# Patient Record
Sex: Female | Born: 1980 | Race: Black or African American | Hispanic: No | Marital: Married | Smoking: Former smoker
Health system: Southern US, Community
[De-identification: ages and names within clinical notes are randomized; demographics above are authoritative.]

## PROBLEM LIST (undated history)

## (undated) DIAGNOSIS — E119 Type 2 diabetes mellitus without complications: Secondary | ICD-10-CM

## (undated) DIAGNOSIS — L03119 Cellulitis of unspecified part of limb: Secondary | ICD-10-CM

## (undated) DIAGNOSIS — E11628 Type 2 diabetes mellitus with other skin complications: Secondary | ICD-10-CM

## (undated) DIAGNOSIS — J45909 Unspecified asthma, uncomplicated: Secondary | ICD-10-CM

## (undated) HISTORY — DX: Type 2 diabetes mellitus with other skin complications: E11.628

## (undated) HISTORY — DX: Cellulitis of unspecified part of limb: L03.119

## (undated) HISTORY — PX: OTHER SURGICAL HISTORY: SHX169

---

## 2012-10-19 DIAGNOSIS — R109 Unspecified abdominal pain: Secondary | ICD-10-CM | POA: Insufficient documentation

## 2012-10-19 NOTE — ED Notes (Signed)
Pt c/o back that radiates across left flank and down left inguinal region, with nausea Denies vomiting. LMP November

## 2012-10-20 ENCOUNTER — Emergency Department (HOSPITAL_COMMUNITY)
Admission: EM | Admit: 2012-10-20 | Discharge: 2012-10-20 | Discharge status: Against Medical Advice | Payer: Self-pay | Attending: Emergency Medicine | Admitting: Emergency Medicine

## 2012-10-20 NOTE — ED Notes (Signed)
Called Pt twice to reassess vitals and no answer.

## 2012-11-17 ENCOUNTER — Emergency Department (HOSPITAL_COMMUNITY): Payer: Self-pay

## 2012-11-17 ENCOUNTER — Encounter (HOSPITAL_COMMUNITY): Payer: Self-pay | Admitting: *Deleted

## 2012-11-17 ENCOUNTER — Emergency Department (HOSPITAL_COMMUNITY)
Admission: EM | Admit: 2012-11-17 | Discharge: 2012-11-18 | Disposition: A | Discharge status: Home / Self Care | Payer: Self-pay | Attending: Emergency Medicine | Admitting: Emergency Medicine

## 2012-11-17 DIAGNOSIS — S91309A Unspecified open wound, unspecified foot, initial encounter: Secondary | ICD-10-CM | POA: Insufficient documentation

## 2012-11-17 DIAGNOSIS — Y9389 Activity, other specified: Secondary | ICD-10-CM | POA: Insufficient documentation

## 2012-11-17 DIAGNOSIS — Y929 Unspecified place or not applicable: Secondary | ICD-10-CM | POA: Insufficient documentation

## 2012-11-17 DIAGNOSIS — S91332A Puncture wound without foreign body, left foot, initial encounter: Secondary | ICD-10-CM

## 2012-11-17 DIAGNOSIS — W268XXA Contact with other sharp object(s), not elsewhere classified, initial encounter: Secondary | ICD-10-CM | POA: Insufficient documentation

## 2012-11-17 DIAGNOSIS — J45909 Unspecified asthma, uncomplicated: Secondary | ICD-10-CM | POA: Insufficient documentation

## 2012-11-17 DIAGNOSIS — Z23 Encounter for immunization: Secondary | ICD-10-CM | POA: Insufficient documentation

## 2012-11-17 HISTORY — DX: Unspecified asthma, uncomplicated: J45.909

## 2012-11-17 MED ORDER — TETANUS-DIPHTH-ACELL PERTUSSIS 5-2.5-18.5 LF-MCG/0.5 IM SUSP
0.5000 mL | Freq: Once | INTRAMUSCULAR | Status: AC
  Administered 2012-11-18: 0.5 mL via INTRAMUSCULAR
  Filled 2012-11-17: qty 0.5

## 2012-11-17 NOTE — ED Provider Notes (Signed)
History     CSN: [id number]  Arrival date & time 11/17/12  2022   First MD Initiated Contact with Patient 11/17/12 2352      Chief Complaint  Patient presents with  . Laceration    (Consider location/radiation/quality/duration/timing/severity/associated sxs/prior treatment) HPI Comments: PAtient stepped on a piece of broken glass, bled at the time Last tetanus 2005  Patient is a 32 y.o. female presenting with skin laceration. The history is provided by the patient.  Laceration Location:  Foot Foot laceration location:  L foot Depth:  Cutaneous Quality: straight   Bleeding: controlled   Time since incident:  4 hours Laceration mechanism:  Broken glass   Past Medical History  Diagnosis Date  . Asthma     History reviewed. No pertinent past surgical history.  No family history on file.  History  Substance Use Topics  . Smoking status: Never Smoker   . Smokeless tobacco: Not on file  . Alcohol Use: Yes    OB History   Grav Para Term Preterm Abortions TAB SAB Ect Mult Living                  Review of Systems  HENT: Negative.   Respiratory: Negative.   Gastrointestinal: Negative for nausea.  Musculoskeletal: Negative for joint swelling and gait problem.  Skin: Positive for wound.  Neurological: Negative for numbness.  All other systems reviewed and are negative.    Allergies  Amoxapine and related; Tramadol; and Rocephin  Home Medications   Current Outpatient Rx  Name  Route  Sig  Dispense  Refill  . ibuprofen (ADVIL,MOTRIN) 200 MG tablet   Oral   Take 400 mg by mouth every 6 (six) hours as needed for pain or headache.           BP 122/80  Pulse 93  Temp(Src) 98.6 F (37 C)  Resp 22  SpO2 98%  LMP 08/19/2012  Physical Exam  Nursing note and vitals reviewed. Constitutional: She is oriented to person, place, and time. She appears well-developed and well-nourished.  HENT:  Head: Normocephalic and atraumatic.  Eyes: Pupils are equal,  round, and reactive to light.  Neck: Normal range of motion.  Cardiovascular: Normal rate.   Pulmonary/Chest: Effort normal.  Musculoskeletal: Normal range of motion.  Small .5cm puncture/laceration to sole of L foot no active bleeding   Neurological: She is alert and oriented to person, place, and time.  Skin: Skin is warm. No erythema.    ED Course  Procedures (including critical care time)  Labs Reviewed - No data to display Dg Foot Complete Left  11/17/2012  *RADIOLOGY REPORT*  Clinical Data: Radiopaque foreign body.  Stepped on glass object.  LEFT FOOT - COMPLETE 3+ VIEW  Comparison: None.  Findings: There is abnormal alignment of the foot however this is probably chronic or congenital.  Metatarsus adductus is present with hallux valgus.  Pes planus is also present.  There is no radiopaque foreign body identified.  On one of the lateral views, there is a faint density over the plantar aspect of the foot which is likely artifactual or projectional, not seen on the second lateral view.  There is no fracture.  Mild distraction of the first and second metatarsal bases associated with metatarsus adductus.  IMPRESSION: No radiopaque foreign body.  Pes planus and metatarsus adductus.   Original Report Authenticated By: Andreas Newport, M.D.      No diagnosis found.    MDM   Xray reviewed wound  explored No FB will update tetanus         Arman Filter, NP 11/18/12 0005

## 2012-11-17 NOTE — ED Notes (Signed)
The pt stepped on a piece of broken mirror  Earlier today.  She reports that it went into the foot very deep and she thinks a piece of the mirror is still in the foot.  Intermittent bleeding since then

## 2012-11-18 NOTE — ED Provider Notes (Signed)
Medical screening examination/treatment/procedure(s) were performed by non-physician practitioner and as supervising physician I was immediately available for consultation/collaboration.  Jones Skene, M.D.   Jones Skene, MD 11/18/12 [phone]

## 2013-02-17 ENCOUNTER — Encounter (HOSPITAL_COMMUNITY): Payer: Self-pay | Admitting: *Deleted

## 2013-02-17 DIAGNOSIS — R609 Edema, unspecified: Secondary | ICD-10-CM | POA: Insufficient documentation

## 2013-02-17 DIAGNOSIS — J45909 Unspecified asthma, uncomplicated: Secondary | ICD-10-CM | POA: Insufficient documentation

## 2013-02-17 DIAGNOSIS — H53149 Visual discomfort, unspecified: Secondary | ICD-10-CM | POA: Insufficient documentation

## 2013-02-17 DIAGNOSIS — H538 Other visual disturbances: Secondary | ICD-10-CM | POA: Insufficient documentation

## 2013-02-17 DIAGNOSIS — R748 Abnormal levels of other serum enzymes: Secondary | ICD-10-CM | POA: Insufficient documentation

## 2013-02-17 DIAGNOSIS — Z888 Allergy status to other drugs, medicaments and biological substances status: Secondary | ICD-10-CM | POA: Insufficient documentation

## 2013-02-17 DIAGNOSIS — Z881 Allergy status to other antibiotic agents status: Secondary | ICD-10-CM | POA: Insufficient documentation

## 2013-02-17 DIAGNOSIS — R51 Headache: Secondary | ICD-10-CM | POA: Insufficient documentation

## 2013-02-17 LAB — BASIC METABOLIC PANEL
BUN: 10 mg/dL (ref 6–23)
Chloride: 105 mEq/L (ref 96–112)
GFR calc Af Amer: 82 mL/min — ABNORMAL LOW (ref >90)
Potassium: 3.5 mEq/L (ref 3.5–5.1)

## 2013-02-17 LAB — CBC
HCT: 39.8 % (ref 36.0–46.0)
Hemoglobin: 13.5 g/dL (ref 12.0–15.0)
WBC: 8.5 10*3/uL (ref 4.0–10.5)

## 2013-02-17 NOTE — ED Notes (Addendum)
Pt in c/o headache to the right side of head x2-3 days, pt states the pain is constant and has no relief at home, also c/o increased swelling in her ankles, states she always has some swelling but that is has been worse recently, pt also states she has been extremely thirsty recently and she can't seem to get enough to drink, no history of diabetes

## 2013-02-18 ENCOUNTER — Encounter (HOSPITAL_COMMUNITY): Payer: Self-pay | Admitting: Radiology

## 2013-02-18 ENCOUNTER — Emergency Department (HOSPITAL_COMMUNITY)
Admission: EM | Admit: 2013-02-18 | Discharge: 2013-02-18 | Disposition: A | Discharge status: Home / Self Care | Payer: Self-pay | Attending: Emergency Medicine | Admitting: Emergency Medicine

## 2013-02-18 ENCOUNTER — Emergency Department (HOSPITAL_COMMUNITY): Payer: Self-pay

## 2013-02-18 DIAGNOSIS — R7401 Elevation of levels of liver transaminase levels: Secondary | ICD-10-CM

## 2013-02-18 DIAGNOSIS — R519 Headache, unspecified: Secondary | ICD-10-CM

## 2013-02-18 LAB — HEPATIC FUNCTION PANEL
ALT: 50 U/L — ABNORMAL HIGH (ref 0–35)
AST: 48 U/L — ABNORMAL HIGH (ref 0–37)
Total Protein: 7.2 g/dL (ref 6.0–8.3)

## 2013-02-18 LAB — GLUCOSE, CAPILLARY: Glucose-Capillary: 114 mg/dL — ABNORMAL HIGH (ref 70–99)

## 2013-02-18 MED ORDER — METOCLOPRAMIDE HCL 10 MG PO TABS: 10.0000 mg | ORAL_TABLET | Freq: Four times a day (QID) | ORAL | Status: DC | PRN

## 2013-02-18 MED ORDER — METOCLOPRAMIDE HCL 5 MG/ML IJ SOLN
10.0000 mg | Freq: Once | INTRAMUSCULAR | Status: AC
  Administered 2013-02-18: 10 mg via INTRAVENOUS
  Filled 2013-02-18: qty 2

## 2013-02-18 MED ORDER — DIPHENHYDRAMINE HCL 50 MG/ML IJ SOLN
25.0000 mg | Freq: Once | INTRAMUSCULAR | Status: AC
  Administered 2013-02-18: 25 mg via INTRAVENOUS
  Filled 2013-02-18: qty 1

## 2013-02-18 NOTE — ED Provider Notes (Signed)
CSN: [id number]     Arrival date & time 02/17/13  2151 History     First MD Initiated Contact with Patient 02/18/13 0320     Chief Complaint  Patient presents with  . Headache   (Consider location/radiation/quality/duration/timing/severity/associated sxs/prior Treatment) Patient is a 32 y.o. female presenting with headaches. The history is provided by the patient.  Headache She has been having a dull right-sided headache for about the last week. Severity of headache waxes and wanes. It has been as severe as 10/10, but currently is at 8/10. It never goes away completely. Headache is worse with exposure to bright light. Nothing makes it better. She's tried taking acetaminophen with no relief. She has noted some blurring of her vision over the last 2 days. She denies nausea or vomiting. She denies weakness or numbness. She has not had a headache like this before. She is also noted that she has some swelling in her feet and ankles and that she is thirsty all the time.  Past Medical History  Diagnosis Date  . Asthma    History reviewed. No pertinent past surgical history. History reviewed. No pertinent family history. History  Substance Use Topics  . Smoking status: Never Smoker   . Smokeless tobacco: Not on file  . Alcohol Use: Yes   OB History   Grav Para Term Preterm Abortions TAB SAB Ect Mult Living                 Review of Systems  Neurological: Positive for headaches.  All other systems reviewed and are negative.    Allergies  Amoxapine and related; Tramadol; and Rocephin  Home Medications   Current Outpatient Rx  Name  Route  Sig  Dispense  Refill  . ibuprofen (ADVIL,MOTRIN) 200 MG tablet   Oral   Take 400 mg by mouth every 6 (six) hours as needed for pain or headache.          BP 104/60  Pulse 87  Temp(Src) 98.5 F (36.9 C) (Oral)  Resp 20  Wt 286 lb (129.729 kg)  SpO2 98% Physical Exam  Nursing note and vitals reviewed.   ED Course  Morbidly obese  32 year old female, resting comfortably and in no acute distress. Vital signs are normal. Oxygen saturation is 98%, which is normal. Head is normocephalic and atraumatic. PERRLA, EOMI. Oropharynx is clear. Fundi show no hemorrhage, exudate, or papilledema. Neck is nontender and supple without adenopathy or JVD. Back is nontender and there is no CVA tenderness. Lungs are clear without rales, wheezes, or rhonchi. Chest is nontender. Heart has regular rate and rhythm without murmur. Abdomen is soft, flat, nontender without masses or hepatosplenomegaly and peristalsis is normoactive. Extremities have 2+ edema, full range of motion is present. Skin is warm and dry without rash. Neurologic: Mental status is normal, cranial nerves are intact, there are no motor or sensory deficits.   Procedures (including critical care time)  Results for orders placed during the hospital encounter of 02/18/13  BASIC METABOLIC PANEL      Result Value Range   Sodium 138  135 - 145 mEq/L   Potassium 3.5  3.5 - 5.1 mEq/L   Chloride 105  96 - 112 mEq/L   CO2 26  19 - 32 mEq/L   Glucose, Bld 100 (*) 70 - 99 mg/dL   BUN 10  6 - 23 mg/dL   Creatinine, Ser 4.09  0.50 - 1.10 mg/dL   Calcium 9.1  8.4 - 81.1  mg/dL   GFR calc non Af Amer 71 (*) >90 mL/min   GFR calc Af Amer 82 (*) >90 mL/min  CBC      Result Value Range   WBC 8.5  4.0 - 10.5 K/uL   RBC 5.39 (*) 3.87 - 5.11 MIL/uL   Hemoglobin 13.5  12.0 - 15.0 g/dL   HCT 08.6  57.8 - 46.9 %   MCV 73.8 (*) 78.0 - 100.0 fL   MCH 25.0 (*) 26.0 - 34.0 pg   MCHC 33.9  30.0 - 36.0 g/dL   RDW 62.9  52.8 - 41.3 %   Platelets 324  150 - 400 K/uL  GLUCOSE, CAPILLARY      Result Value Range   Glucose-Capillary 92  70 - 99 mg/dL  GLUCOSE, CAPILLARY      Result Value Range   Glucose-Capillary 114 (*) 70 - 99 mg/dL  HEPATIC FUNCTION PANEL      Result Value Range   Total Protein 7.2  6.0 - 8.3 g/dL   Albumin 3.3 (*) 3.5 - 5.2 g/dL   AST 48 (*) 0 - 37 U/L   ALT 50 (*) 0  - 35 U/L   Alkaline Phosphatase 62  39 - 117 U/L   Total Bilirubin 0.3  0.3 - 1.2 mg/dL   Bilirubin, Direct <2.4  0.0 - 0.3 mg/dL   Indirect Bilirubin NOT CALCULATED  0.3 - 0.9 mg/dL   Ct Head Wo Contrast  02/18/2013   *RADIOLOGY REPORT*  Clinical Data: Headache for 5 days.  No recent injury.  CT HEAD WITHOUT CONTRAST  Technique:  Contiguous axial images were obtained from the base of the skull through the vertex without contrast.  Comparison: None.  Findings:  Calvarium:No acute osseous abnormality. No lytic or blastic lesion.  Orbits: No acute abnormality.  Brain: No evidence of acute abnormality, such as acute infarction, hemorrhage (high attenuation along the inner table of the bilateral temporal/parietal bones is considered artifactual), hydrocephalus, or mass lesion/mass effect.Empty appearance of the mildly expanded sella. Meckel's cave appears enlarged bilaterally, left more than right, with attempted cephalocele on the left.  IMPRESSION: 1.  Negative for acute intracranial abnormality. 2.  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 If there is history of chronic headaches, there is an association with pseudotumor cerebri.   Original Report Authenticated By: Tiburcio Pea    1. Headache   2. Elevated transaminase level     MDM  Headache of uncertain cause. Peripheral edema of uncertain cause. Blood sugar is only minimally elevated and renal function is normal. Hepatic function tests will be ordered as well as urinalysis to look for evidence of proteinuria. She will be sent for CT of the head. She will be given metoclopramide and diphenhydramine to try and treat her headache. She has not given a fluid bolus in light of her peripheral edema. Old records are reviewed and she has no relevant prior visits.  She got excellent relief of her headache with metoclopramide and diphenhydramine. Minimal elevation of ALT and AST is noted and patient is advised to have these repeated. CT  shows empty sella which does have an association with pseudotumor cerebri. However, response to metoclopramide to suggest that she does not have pseudotumor cerebri. She is referred back to her PCP for followup as she is given a prescription for metoclopramide to use as needed.  Dione Booze, MD 02/18/13 [phone]

## 2013-02-18 NOTE — ED Notes (Addendum)
Patient reports pain is still present with it continuing to be sharp in nature. Pain is an 8 of a 0/10 scale.

## 2014-07-24 DIAGNOSIS — I2699 Other pulmonary embolism without acute cor pulmonale: Secondary | ICD-10-CM

## 2014-07-24 HISTORY — DX: Other pulmonary embolism without acute cor pulmonale: I26.99

## 2016-12-21 ENCOUNTER — Encounter (HOSPITAL_COMMUNITY): Payer: Self-pay

## 2016-12-21 ENCOUNTER — Emergency Department (HOSPITAL_COMMUNITY)
Admission: EM | Admit: 2016-12-21 | Discharge: 2016-12-21 | Disposition: A | Discharge status: Home / Self Care | Payer: BLUE CROSS/BLUE SHIELD | Attending: Emergency Medicine | Admitting: Emergency Medicine

## 2016-12-21 DIAGNOSIS — L0501 Pilonidal cyst with abscess: Secondary | ICD-10-CM | POA: Insufficient documentation

## 2016-12-21 DIAGNOSIS — J45909 Unspecified asthma, uncomplicated: Secondary | ICD-10-CM | POA: Insufficient documentation

## 2016-12-21 DIAGNOSIS — L0291 Cutaneous abscess, unspecified: Secondary | ICD-10-CM | POA: Diagnosis present

## 2016-12-21 DIAGNOSIS — Z79899 Other long term (current) drug therapy: Secondary | ICD-10-CM | POA: Insufficient documentation

## 2016-12-21 DIAGNOSIS — L0591 Pilonidal cyst without abscess: Secondary | ICD-10-CM

## 2016-12-21 LAB — CBG MONITORING, ED: GLUCOSE-CAPILLARY: 342 mg/dL — AB (ref 65–99)

## 2016-12-21 MED ORDER — LIDOCAINE-EPINEPHRINE (PF) 2 %-1:200000 IJ SOLN
10.0000 mL | Freq: Once | INTRAMUSCULAR | Status: AC
  Administered 2016-12-21: 10 mL
  Filled 2016-12-21: qty 20

## 2016-12-21 MED ORDER — OXYCODONE-ACETAMINOPHEN 5-325 MG PO TABS
2.0000 | ORAL_TABLET | Freq: Once | ORAL | Status: AC
  Administered 2016-12-21: 2 via ORAL
  Filled 2016-12-21: qty 2

## 2016-12-21 MED ORDER — BUPIVACAINE HCL (PF) 0.5 % IJ SOLN
10.0000 mL | Freq: Once | INTRAMUSCULAR | Status: AC
  Administered 2016-12-21: 10 mL
  Filled 2016-12-21: qty 10

## 2016-12-21 MED ORDER — SULFAMETHOXAZOLE-TRIMETHOPRIM 800-160 MG PO TABS: 1.0000 | ORAL_TABLET | Freq: Two times a day (BID) | ORAL | 0 refills | Status: AC

## 2016-12-21 MED ORDER — HYDROCODONE-ACETAMINOPHEN 5-325 MG PO TABS: 1.0000 | ORAL_TABLET | Freq: Four times a day (QID) | ORAL | 0 refills | Status: DC | PRN

## 2016-12-21 MED ORDER — LIDOCAINE-EPINEPHRINE-TETRACAINE (LET) SOLUTION
3.0000 mL | Freq: Once | NASAL | Status: AC
  Administered 2016-12-21: 3 mL via TOPICAL
  Filled 2016-12-21: qty 3

## 2016-12-21 NOTE — ED Triage Notes (Signed)
Pt has abscess on the upper buttox that is hard and about 2in long

## 2016-12-21 NOTE — ED Provider Notes (Signed)
[facility]-EMERGENCY DEPT Provider Note   CSN: [id number] Arrival date & time: 12/21/16  4098  By signing my name below, I, Shannon Oneal, attest that this documentation has been prepared under the direction and in the presence of Foy Mungia, PA-C.  Electronically Signed: Rosario Oneal, ED Scribe. 12/21/16. 10:27 AM.  History   Chief Complaint Chief Complaint  Patient presents with  . Abscess   The history is provided by the patient. No language interpreter was used.    HPI Comments: Shannon Oneal is a 36 y.o. female with a PMHx of DM and asthma, who presents to the Emergency Department complaining of a moderate, gradually worsening area of pain and swelling to the left gluteal cleft onset four days ago. She notes some minimal drainage from the area. Pt states pain is exacerbated with palpation and direct pressure. No noted treatments for her symptoms were tried prior to coming into the ED. No h/o similar symptoms. Denies fever, chills, or any other associated symptoms.   Past Medical History:  Diagnosis Date  . Asthma     There are no active problems to display for this patient.  History reviewed. No pertinent surgical history.  OB History    No data available     Home Medications    Prior to Admission medications   Medication Sig Start Date End Date Taking? Authorizing Provider  doxycycline (VIBRAMYCIN) 100 MG capsule Take 1 capsule (100 mg total) by mouth 2 (two) times daily. 12/23/16   Roxy Horseman, PA-C  HYDROcodone-acetaminophen (NORCO/VICODIN) 5-325 MG tablet Take 1 tablet by mouth every 6 (six) hours as needed. 12/21/16   Taaliyah Delpriore C, PA-C  ibuprofen (ADVIL,MOTRIN) 200 MG tablet Take 400 mg by mouth every 6 (six) hours as needed for pain or headache.    [provider]  metoCLOPramide (REGLAN) 10 MG tablet Take 1 tablet (10 mg total) by mouth every 6 (six) hours as needed (Nausea or headache). 02/18/13   Dione Booze, MD  sulfamethoxazole-trimethoprim  (BACTRIM DS,SEPTRA DS) 800-160 MG tablet Take 1 tablet by mouth 2 (two) times daily. 12/21/16 12/28/16  Anselm Pancoast, PA-C   Family History No family history on file.  Social History Social History  Substance Use Topics  . Smoking status: Never Smoker  . Smokeless tobacco: Never Used  . Alcohol use Yes   Allergies   Amoxapine and related; Tramadol; and Rocephin [ceftriaxone]  Review of Systems Review of Systems  Constitutional: Negative for chills and fever.  Skin: Positive for wound.       +area of swelling and pain to the left gluteal cleft   Physical Exam Updated Vital Signs BP 113/88 (BP Location: Right Arm)   Pulse 74   Temp 98 F (36.7 C) (Oral)   Resp 18   Ht 5\' 5"  (1.651 m)   Wt 117.9 kg (260 lb)   LMP 11/24/2016   SpO2 98%   BMI 43.27 kg/m   Physical Exam  Constitutional: She appears well-developed and well-nourished. No distress.  HENT:  Head: Normocephalic and atraumatic.  Eyes: Conjunctivae are normal.  Neck: Neck supple.  Cardiovascular: Normal rate and regular rhythm.   Pulmonary/Chest: Effort normal.  Neurological: She is alert.  Skin: Skin is warm and dry. She is not diaphoretic.  7cm x 3.5cm area of tenderness, swelling, erythema, and induration to the left of the gluteal cleft.   Psychiatric: She has a normal mood and affect. Her behavior is normal.  Nursing note and vitals reviewed.  ED Treatments / Results  DIAGNOSTIC STUDIES: Oxygen Saturation is 97% on RA, normal by my interpretation.   COORDINATION OF CARE: 10:26 AM-Discussed next steps with pt. Pt verbalized understanding and is agreeable with the plan.   Labs (all labs ordered are listed, but only abnormal results are displayed) Labs Reviewed  CBG MONITORING, ED - Abnormal; Notable for the following:       Result Value   Glucose-Capillary 342 (*)    All other components within normal limits    EKG  EKG Interpretation None      Radiology No results  found.  Procedures .Marland Kitchen.Incision and Drainage Date/Time: 12/21/2016 12:26 PM Performed by: Anselm PancoastJOY, Xaniyah Buchholz C Authorized by: Anselm PancoastJOY, Aruna Nestler C   Consent:    Consent obtained:  Verbal   Consent given by:  Patient   Risks discussed:  Bleeding and damage to other organs   Alternatives discussed:  No treatment Universal protocol:    Procedure explained and questions answered to patient or proxy's satisfaction: yes     Relevant documents present and verified: yes     Test results available and properly labeled: yes     Imaging studies available: yes     Required blood products, implants, devices, and special equipment available: yes     Site/side marked: yes     Immediately prior to procedure a time out was called: yes     Patient identity confirmed:  Verbally with patient, arm band and provided demographic data Location:    Type:  Abscess   Size:  7x3.5cm   Location:  Anogenital   Anogenital location:  Gluteal cleft (L) Pre-procedure details:    Skin preparation:  Betadine Sedation:    Sedation type: n/a. Anesthesia (see MAR for exact dosages):    Anesthesia method:  Topical application and local infiltration   Topical anesthetic:  LET   Local anesthetic:  Lidocaine 2% WITH epi and bupivacaine 0.5% w/o epi Procedure type:    Complexity:  Complex Procedure details:    Needle aspiration: no     Incision types:  Single straight   Incision depth:  Subcutaneous   Scalpel blade:  11   Wound management:  Probed and deloculated, irrigated with saline and extensive cleaning   Drainage:  Bloody and purulent   Drainage amount:  Moderate   Wound treatment:  Wound left open   Packing materials:  1/4 in iodoform gauze   Amount 1/4" iodoform:  10in Post-procedure details:    Patient tolerance of procedure:  Tolerated well, no immediate complications     EMERGENCY DEPARTMENT US SOFT TISSUE INTERPRETATION "Study: Limited Soft Tissue Ultrasound"  INDICATIONS: Pain Multiple views of the body part  were obtained in real-time with a multi-frequency linear probe  PERFORMED BY: Myself IMAGES ARCHIVED?: Yes SIDE:Left BODY PART:Left gluteal region INTERPRETATION:  Abcess present    Medications Ordered in ED Medications  bupivacaine (MARCAINE) 0.5 % injection 10 mL (10 mLs Infiltration Given 12/21/16 1134)  lidocaine-EPINEPHrine (XYLOCAINE W/EPI) 2 %-1:200000 (PF) injection 10 mL (10 mLs Infiltration Given 12/21/16 1134)  lidocaine-EPINEPHrine-tetracaine (LET) solution (3 mLs Topical Given 12/21/16 1134)  oxyCODONE-acetaminophen (PERCOCET/ROXICET) 5-325 MG per tablet 2 tablet (2 tablets Oral Given 12/21/16 1142)   Initial Impression / Assessment and Plan / ED Course  I have reviewed the triage vital signs and the nursing notes.  Pertinent labs & imaging results that were available during my care of the patient were reviewed by me and considered in my medical decision making (see chart for  details).     Patient presents with an area of swelling and pain. Central fluid collection noted on ultrasound. I&D performed without immediate complication. Return in 3 days for wound check and packing removal, if still in place. The patient was given instructions for home care as well as return precautions. Patient voices understanding of these instructions, accepts the plan, and is comfortable with discharge.  Final Clinical Impressions(s) / ED Diagnoses   Final diagnoses:  Pilonidal cyst   New Prescriptions Discharge Medication List as of 12/21/2016  1:03 PM    START taking these medications   Details  HYDROcodone-acetaminophen (NORCO/VICODIN) 5-325 MG tablet Take 1 tablet by mouth every 6 (six) hours as needed., Starting Thu 12/21/2016, Print    sulfamethoxazole-trimethoprim (BACTRIM DS,SEPTRA DS) 800-160 MG tablet Take 1 tablet by mouth 2 (two) times daily., Starting Thu 12/21/2016, Until Thu 12/28/2016, Print       I personally performed the services described in this documentation, which was  scribed in my presence. The recorded information has been reviewed and is accurate.    Anselm Pancoast, PA-C 12/23/16 1137    Bethann Berkshire, MD 12/24/16 1205

## 2016-12-21 NOTE — Discharge Instructions (Signed)
Keep the area clean and dry. The area will continue to drain over the next couple days. Return to the ED in about 3 days for a wound check. Return as needed thereafter. Should symptoms recur, follow up with general surgery. Call to make an appointment.  You should also establish care with a primary care provider in the area. The case manager may be calling you to help you facilitate this. It is also very important that you properly manage your diabetes.  Please take all of your antibiotics until finished!   You may develop abdominal discomfort or diarrhea from the antibiotic.  You may help offset this with probiotics which you can buy or get in yogurt. Do not eat or take the probiotics until 2 hours after your antibiotic.   Ibuprofen, naproxen, or Tylenol for pain. Vicodin for severe pain. Do not drive or perform other dangerous activities while taking the Vicodin.

## 2016-12-21 NOTE — ED Notes (Signed)
CBG 342 mg/dL reported to Debroah Looputh Richards, RN

## 2016-12-23 ENCOUNTER — Encounter (HOSPITAL_COMMUNITY): Payer: Self-pay

## 2016-12-23 ENCOUNTER — Emergency Department (HOSPITAL_COMMUNITY)
Admission: EM | Admit: 2016-12-23 | Discharge: 2016-12-23 | Disposition: A | Discharge status: Home / Self Care | Payer: BLUE CROSS/BLUE SHIELD | Attending: Emergency Medicine | Admitting: Emergency Medicine

## 2016-12-23 DIAGNOSIS — Z4801 Encounter for change or removal of surgical wound dressing: Secondary | ICD-10-CM | POA: Diagnosis not present

## 2016-12-23 DIAGNOSIS — J45909 Unspecified asthma, uncomplicated: Secondary | ICD-10-CM | POA: Insufficient documentation

## 2016-12-23 DIAGNOSIS — Z79899 Other long term (current) drug therapy: Secondary | ICD-10-CM | POA: Insufficient documentation

## 2016-12-23 DIAGNOSIS — Z5189 Encounter for other specified aftercare: Secondary | ICD-10-CM

## 2016-12-23 DIAGNOSIS — Z760 Encounter for issue of repeat prescription: Secondary | ICD-10-CM | POA: Diagnosis not present

## 2016-12-23 MED ORDER — DOXYCYCLINE HYCLATE 100 MG PO CAPS: 100.0000 mg | ORAL_CAPSULE | Freq: Two times a day (BID) | ORAL | 0 refills | Status: DC

## 2016-12-23 MED ORDER — DOXYCYCLINE HYCLATE 100 MG PO TABS
100.0000 mg | ORAL_TABLET | Freq: Once | ORAL | Status: AC
  Administered 2016-12-23: 100 mg via ORAL
  Filled 2016-12-23: qty 1
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
## 2016-12-24 ENCOUNTER — Encounter (HOSPITAL_COMMUNITY): Payer: Self-pay | Admitting: Emergency Medicine

## 2016-12-24 ENCOUNTER — Emergency Department (HOSPITAL_COMMUNITY)
Admission: EM | Admit: 2016-12-24 | Discharge: 2016-12-24 | Disposition: A | Discharge status: Home / Self Care | Payer: BLUE CROSS/BLUE SHIELD | Attending: Emergency Medicine | Admitting: Emergency Medicine

## 2016-12-24 DIAGNOSIS — J45909 Unspecified asthma, uncomplicated: Secondary | ICD-10-CM | POA: Insufficient documentation

## 2016-12-24 DIAGNOSIS — Z5189 Encounter for other specified aftercare: Secondary | ICD-10-CM

## 2016-12-24 DIAGNOSIS — Z79899 Other long term (current) drug therapy: Secondary | ICD-10-CM | POA: Insufficient documentation

## 2016-12-24 DIAGNOSIS — L0501 Pilonidal cyst with abscess: Secondary | ICD-10-CM | POA: Insufficient documentation

## 2016-12-24 NOTE — Discharge Instructions (Signed)
Please read instructions below. Continue taking your antibiotic, doxycycline, as prescribed until it is gone. Schedule an appointment with the surgeon to follow-up on your abscess. Keep you wound clean, dry and covered. Return to the ER for fever, or new or concerning symptoms.

## 2016-12-24 NOTE — ED Triage Notes (Signed)
Pt to ER for wound check where patient had abscess to buttocks drained on Thursday. Pt reports hx of diabetes and wants to make sure it is healing appropriately. Reports minimal drainage. Reports compliance with antibiotics.

## 2016-12-24 NOTE — ED Notes (Signed)
Declined W/C at D/C and was escorted to lobby by RN. 

## 2016-12-24 NOTE — ED Provider Notes (Signed)
[facility]-EMER[facility]ENCY DEPT Provider Note   CSN: [id number] Arrival date & time: 12/24/16  1345  By signing my name below, I, Shannon Oneal, attest that this documentation has been prepared under the direction and in the presence of non-physician practitioner, Russo, [place], PA-C. Electronically Signed: Rosana Oneal, ED Scribe. 12/24/16. 3:44 PM.  History   Chief Complaint Chief Complaint  Patient presents with  . Wound Check   The history is provided by the patient. No language interpreter was used.   HPI Comments: Shannon Oneal is a 36 y.o. female who presents to the Emergency Department complaining of a wound check for an pilonidal cyst I&D on her gluteal cleft that occurred 4 days ago. Pt is compliant with her antibiotics. Pt notes drainage from the area. Pt reports associated nausea. Pt denies fever, vomiting or any other complaints at this time.  Past Medical History:  Diagnosis Date  . Asthma     There are no active problems to display for this patient.   History reviewed. No pertinent surgical history.  OB History    No data available       Home Medications    Prior to Admission medications   Medication Sig Start Date End Date Taking? Authorizing Provider  doxycycline (VIBRAMYCIN) 100 MG capsule Take 1 capsule (100 mg total) by mouth 2 (two) times daily. 12/23/16   Roxy Horseman, PA-C  HYDROcodone-acetaminophen (NORCO/VICODIN) 5-325 MG tablet Take 1 tablet by mouth every 6 (six) hours as needed. 12/21/16   Joy, Shawn C, PA-C  ibuprofen (ADVIL,MOTRIN) 200 MG tablet Take 400 mg by mouth every 6 (six) hours as needed for pain or headache.    [provider]  metoCLOPramide (REGLAN) 10 MG tablet Take 1 tablet (10 mg total) by mouth every 6 (six) hours as needed (Nausea or headache). 02/18/13   Dione Booze, MD  sulfamethoxazole-trimethoprim (BACTRIM DS,SEPTRA DS) 800-160 MG tablet Take 1 tablet by mouth 2 (two) times daily. 12/21/16 12/28/16  Anselm Pancoast, PA-C     Family History No family history on file.  Social History Social History  Substance Use Topics  . Smoking status: Never Smoker  . Smokeless tobacco: Never Used  . Alcohol use Yes     Allergies   Amoxapine and related; Tramadol; and Rocephin [ceftriaxone]   Review of Systems Review of Systems  Constitutional: Negative for fever.  Skin: Positive for wound.     Physical Exam Updated Vital Signs BP 117/76 (BP Location: Left Arm)   Pulse 86   Temp 98.3 F (36.8 C) (Oral)   Resp 17   Ht 5\' 5"  (1.651 m)   Wt 117.9 kg (260 lb)   LMP 11/24/2016   SpO2 99%   BMI 43.27 kg/m   Physical Exam  Constitutional: She appears well-developed and well-nourished.  HENT:  Head: Normocephalic and atraumatic.  Eyes: Conjunctivae are normal.  Cardiovascular: Normal rate.   Pulmonary/Chest: Effort normal.  Skin: No erythema.  Exam performed with chaperone present. There is 1.5 cm wound to the left gluteal cleft with purulent drainage. Packing was removed during exam and base of abscess is easily visualized. No significant surrounding erythema.   Psychiatric: She has a normal mood and affect. Her behavior is normal.  Nursing note and vitals reviewed.    ED Treatments / Results  DIAGNOSTIC STUDIES: Oxygen Saturation is 100% on RA, normal by my interpretation.   COORDINATION OF CARE: 3:43 PM-Discussed next steps with pt including repacking with wound and possible surgery. Pt verbalized understanding  and is agreeable with the plan.   Labs (all labs ordered are listed, but only abnormal results are displayed) Labs Reviewed - No data to display  EKG  EKG Interpretation None       Radiology No results found.  Procedures Procedures (including critical care time) EMERGENCY DEPARTMENT US SOFT TISSUE INTERPRETATION "Study: Limited Soft Tissue Ultrasound"  INDICATIONS: Soft tissue infection Multiple views of the body part were obtained in real-time with a  multi-frequency linear probe  PERFORMED BY: Myself IMAGES ARCHIVED?: Yes SIDE:Midline BODY PART:gluteal cleft INTERPRETATION:  No cellulitis noted and no remaining abscess noted. Wound is open with base of wound open to air corresponding to U/S findings, no other areas of fluid noted    Medications Ordered in ED Medications - No data to display   Initial Impression / Assessment and Plan / ED Course  I have reviewed the triage vital signs and the nursing notes.  Pertinent labs & imaging results that were available during my care of the patient were reviewed by me and considered in my medical decision making (see chart for details).     Pt with pilonidal abscess seen yesterday for wound recheck after I&D on 12/21/16. Pt w purulence noted during visit yesterday, wound repacked and pt started on Doxycycline. Today, wound still with purulence, U/S without residual fluid pocket, no surrounding cellulitis. Wound is open and draining. Pt afebrile and hemodynamically stable. Pt to continue Doxycycline as prescribed. Referral to general surgery given for definitive tx. Discussed home care and return precautions, pt advised to continue all antibiotics until they are gone.   Patient discussed with Dr. Juleen ChinaKohut, who agrees with care plan. Discussed results, findings, treatment and follow up. Patient advised of return precautions. Patient verbalized understanding and agreed with plan.     Final Clinical Impressions(s) / ED Diagnoses   Final diagnoses:  Wound check, abscess    New Prescriptions New Prescriptions   No medications on file   I personally performed the services described in this documentation, which was scribed in my presence. The recorded information has been reviewed and is accurate.    Russo, [place] N, PA-C 12/24/16 1702    Raeford RazorKohut, Stephen, MD 12/24/16 2012

## 2017-05-04 ENCOUNTER — Other Ambulatory Visit: Payer: Self-pay | Admitting: Obstetrics and Gynecology

## 2017-10-11 ENCOUNTER — Encounter (HOSPITAL_COMMUNITY): Payer: Self-pay | Admitting: Nurse Practitioner

## 2017-10-11 ENCOUNTER — Emergency Department (HOSPITAL_COMMUNITY): Payer: 59

## 2017-10-11 DIAGNOSIS — Z794 Long term (current) use of insulin: Secondary | ICD-10-CM | POA: Insufficient documentation

## 2017-10-11 DIAGNOSIS — Z79899 Other long term (current) drug therapy: Secondary | ICD-10-CM | POA: Insufficient documentation

## 2017-10-11 DIAGNOSIS — G8929 Other chronic pain: Secondary | ICD-10-CM | POA: Insufficient documentation

## 2017-10-11 DIAGNOSIS — M25512 Pain in left shoulder: Secondary | ICD-10-CM | POA: Insufficient documentation

## 2017-10-11 DIAGNOSIS — J45909 Unspecified asthma, uncomplicated: Secondary | ICD-10-CM | POA: Diagnosis not present

## 2017-10-11 NOTE — ED Triage Notes (Signed)
Pt is c/o of a HA and left shoulder pain that radiates to the left arm. So much so that she is reports being unable to move her left arm. Denies fall or any kind of trauma to the affected shoulder.

## 2017-10-12 ENCOUNTER — Emergency Department (HOSPITAL_COMMUNITY)
Admission: EM | Admit: 2017-10-12 | Discharge: 2017-10-12 | Disposition: A | Discharge status: Home / Self Care | Payer: 59 | Attending: Emergency Medicine | Admitting: Emergency Medicine

## 2017-10-12 DIAGNOSIS — M25512 Pain in left shoulder: Secondary | ICD-10-CM

## 2017-10-12 DIAGNOSIS — G8929 Other chronic pain: Secondary | ICD-10-CM

## 2017-10-12 MED ORDER — LIDOCAINE 5 % EX PTCH
1.0000 | MEDICATED_PATCH | CUTANEOUS | Status: DC
  Administered 2017-10-12: 1 via TRANSDERMAL
  Filled 2017-10-12: qty 1

## 2017-10-12 MED ORDER — KETOROLAC TROMETHAMINE 60 MG/2ML IM SOLN
60.0000 mg | Freq: Once | INTRAMUSCULAR | Status: AC
  Administered 2017-10-12: 60 mg via INTRAMUSCULAR
  Filled 2017-10-12: qty 2

## 2017-10-12 MED ORDER — MELOXICAM 7.5 MG PO TABS: 15.0000 mg | ORAL_TABLET | Freq: Every day | ORAL | 0 refills | Status: DC

## 2017-10-12 MED ORDER — DIAZEPAM 5 MG PO TABS
5.0000 mg | ORAL_TABLET | Freq: Once | ORAL | Status: AC
Start: 2017-10-12 — End: 2017-10-12
  Administered 2017-10-12: 5 mg via ORAL
  Filled 2017-10-12: qty 1

## 2017-10-12 MED ORDER — LIDOCAINE 5 % EX PTCH: 1.0000 | MEDICATED_PATCH | CUTANEOUS | 0 refills | Status: DC

## 2017-10-12 MED ORDER — DIAZEPAM 5 MG PO TABS: 5.0000 mg | ORAL_TABLET | Freq: Two times a day (BID) | ORAL | 0 refills | Status: DC | PRN

## 2017-10-12 MED ORDER — OXYCODONE-ACETAMINOPHEN 5-325 MG PO TABS: 1.0000 | ORAL_TABLET | Freq: Four times a day (QID) | ORAL | 0 refills | Status: DC | PRN

## 2017-10-12 MED ORDER — OXYCODONE-ACETAMINOPHEN 5-325 MG PO TABS
1.0000 | ORAL_TABLET | Freq: Once | ORAL | Status: AC
  Administered 2017-10-12: 1 via ORAL
  Filled 2017-10-12: qty 1

## 2017-10-12 NOTE — ED Provider Notes (Signed)
[facility]-EMERGENCY DEPT Provider Note   CSN: [id number] Arrival date & time: 10/11/17  2224    History   Chief Complaint Chief Complaint  Patient presents with  . Shoulder Pain    Left  . Headache    HPI Shannon Oneal is a 37 y.o. female.  Patient is a 37 year old female who presents to the emergency department for evaluation of left shoulder pain.  She reports similar pain over the past year.  Pain is been rather constant, but waxing and waning in severity.  She initially tried Tylenol when her symptoms began, but this provided little improvement.  She has since been managing with Aleve as needed.  She notes that this has not been helping her lately.  She has had intermittent tingling paresthesias down to her right hand.  The pain in her shoulder is worse with movement.  She states that it has caused her to develop a headache on the left side of her face.  She has not taken any additional medications for this.  No history of fall, trauma, injury.  No notable extremity weakness.  Patient denies any bowel or bladder incontinence.  The history is provided by the patient. No language interpreter was used.  Shoulder Pain    Headache      Past Medical History:  Diagnosis Date  . Asthma     There are no active problems to display for this patient.   History reviewed. No pertinent surgical history.  OB History   None      Home Medications    Prior to Admission medications   Medication Sig Start Date End Date Taking? Authorizing Provider  ibuprofen (ADVIL,MOTRIN) 200 MG tablet Take 600 mg by mouth every 6 (six) hours as needed for pain or headache.    Yes [provider]  insulin lispro protamine-lispro (HUMALOG 75/25 MIX) (75-25) 100 UNIT/ML SUSP injection Inject 30-60 Units into the skin 2 (two) times daily with a meal. 60 units in the am and 30 units in the pm   Yes [provider]  diazepam (VALIUM) 5 MG tablet Take 1 tablet (5 mg  total) by mouth every 12 (twelve) hours as needed for muscle spasms. 10/12/17   Antony Madura, PA-C  doxycycline (VIBRAMYCIN) 100 MG capsule Take 1 capsule (100 mg total) by mouth 2 (two) times daily. Patient not taking: Reported on 10/12/2017 12/23/16   Roxy Horseman, PA-C  HYDROcodone-acetaminophen (NORCO/VICODIN) 5-325 MG tablet Take 1 tablet by mouth every 6 (six) hours as needed. Patient not taking: Reported on 10/12/2017 12/21/16   Joy, Ines Bloomer C, PA-C  lidocaine (LIDODERM) 5 % Place 1 patch onto the skin daily. Remove & Discard patch within 12 hours or as directed by MD 10/12/17   Antony Madura, PA-C  meloxicam (MOBIC) 7.5 MG tablet Take 2 tablets (15 mg total) by mouth daily. 10/12/17   Antony Madura, PA-C  metoCLOPramide (REGLAN) 10 MG tablet Take 1 tablet (10 mg total) by mouth every 6 (six) hours as needed (Nausea or headache). Patient not taking: Reported on 10/12/2017 02/18/13   Dione Booze, MD  oxyCODONE-acetaminophen (PERCOCET/ROXICET) 5-325 MG tablet Take 1 tablet by mouth every 6 (six) hours as needed for severe pain. 10/12/17   Antony Madura, PA-C    Family History History reviewed. No pertinent family history.  Social History Social History   Tobacco Use  . Smoking status: Never Smoker  . Smokeless tobacco: Never Used  Substance Use Topics  . Alcohol use: Yes  .  Drug use: Not on file     Allergies   Amoxapine and related; Amoxicillin; Tramadol; and Rocephin [ceftriaxone]   Review of Systems Review of Systems  Neurological: Positive for headaches.   Ten systems reviewed and are negative for acute change, except as noted in the HPI.    Physical Exam Updated Vital Signs BP (!) 135/93 (BP Location: Right Arm)   Pulse 93   Temp 98.3 F (36.8 C) (Oral)   Resp 18   LMP 09/13/2017   SpO2 99%   Physical Exam  Constitutional: She is oriented to person, place, and time. She appears well-developed and well-nourished. No distress.  Nontoxic appearing and in NAD. Seemingly  uncomfortable.  HENT:  Head: Normocephalic and atraumatic.  Eyes: Conjunctivae and EOM are normal. No scleral icterus.  Neck: Normal range of motion.  Normal ROM.  Cardiovascular: Normal rate, regular rhythm and intact distal pulses.  Distal radial pulse 2+ in the LUE  Pulmonary/Chest: Effort normal. No respiratory distress.  Respirations even and unlabored  Musculoskeletal: Normal range of motion.  Limited ROM of the L shoulder 2/2 pain. There is TTP diffusely to the left shoulder without bony deformity or crepitus. TTP extends toward the midline along the course of the L trapezius.  Neurological: She is alert and oriented to person, place, and time.  Sensation to light touch intact in bilateral upper extremities.  5/5 grip strength noted on the right with 4+/5 grip strength on the left; suspect poor effort.  Skin: Skin is warm and dry. No rash noted. She is not diaphoretic. No erythema. No pallor.  Psychiatric: She has a normal mood and affect. Her behavior is normal.  Nursing note and vitals reviewed.    ED Treatments / Results  Labs (all labs ordered are listed, but only abnormal results are displayed) Labs Reviewed - No data to display  EKG  EKG Interpretation None       Radiology Dg Shoulder Left  Result Date: 10/11/2017 CLINICAL DATA:  Severe pain. Left shoulder pain for months. No known injury. EXAM: LEFT SHOULDER - 2+ VIEW COMPARISON:  None. FINDINGS: There is no evidence of fracture or dislocation. Minimal inferior glenoid spurring. No bony destructive change or focal lesion. Possible left cervical rib. Soft tissues are unremarkable. IMPRESSION: Minimal inferior glenoid spurring, likely degenerative. No acute osseous abnormality. Electronically Signed   By: Rubye OaksMelanie  Ehinger M.D.   On: 10/11/2017 23:14    Procedures Procedures (including critical care time)  Medications Ordered in ED Medications  lidocaine (LIDODERM) 5 % 1 patch (1 patch Transdermal Patch Applied  10/12/17 0333)  ketorolac (TORADOL) injection 60 mg (60 mg Intramuscular Given 10/12/17 0333)  diazepam (VALIUM) tablet 5 mg (5 mg Oral Given 10/12/17 0333)  oxyCODONE-acetaminophen (PERCOCET/ROXICET) 5-325 MG per tablet 1 tablet (1 tablet Oral Given 10/12/17 0333)     Initial Impression / Assessment and Plan / ED Course  I have reviewed the triage vital signs and the nursing notes.  Pertinent labs & imaging results that were available during my care of the patient were reviewed by me and considered in my medical decision making (see chart for details).     37 year old female presents to the emergency department for evaluation of acute on chronic left shoulder pain.  She is neurovascularly intact.  X-ray shows degenerative changes.  I suspect that this may be due to a persistent injury to the left rotator cuff.  Symptoms have been managed supportively in the emergency department.  I do not believe  further emergent workup is indicated at this time, especially in light of chronicity.  Will refer to orthopedics for follow-up.  Return precautions discussed and provided. Patient discharged in stable condition with no unaddressed concerns.   Final Clinical Impressions(s) / ED Diagnoses   Final diagnoses:  Chronic left shoulder pain    ED Discharge Orders        Ordered    meloxicam (MOBIC) 7.5 MG tablet  Daily     10/12/17 0324    diazepam (VALIUM) 5 MG tablet  Every 12 hours PRN     10/12/17 0324    oxyCODONE-acetaminophen (PERCOCET/ROXICET) 5-325 MG tablet  Every 6 hours PRN     10/12/17 0324    lidocaine (LIDODERM) 5 %  Every 24 hours     10/12/17 0325       Antony Madura, PA-C 10/12/17 0615    Molpus, Jonny Ruiz, MD 10/12/17 [phone]

## 2018-05-31 ENCOUNTER — Encounter (HOSPITAL_COMMUNITY): Payer: Self-pay

## 2018-05-31 ENCOUNTER — Emergency Department (HOSPITAL_COMMUNITY)
Admission: EM | Admit: 2018-05-31 | Discharge: 2018-06-01 | Disposition: A | Discharge status: Home / Self Care | Payer: No Typology Code available for payment source | Attending: Emergency Medicine | Admitting: Emergency Medicine

## 2018-05-31 ENCOUNTER — Emergency Department (HOSPITAL_COMMUNITY): Payer: No Typology Code available for payment source

## 2018-05-31 ENCOUNTER — Other Ambulatory Visit: Payer: Self-pay

## 2018-05-31 DIAGNOSIS — J45909 Unspecified asthma, uncomplicated: Secondary | ICD-10-CM | POA: Insufficient documentation

## 2018-05-31 DIAGNOSIS — Z794 Long term (current) use of insulin: Secondary | ICD-10-CM | POA: Insufficient documentation

## 2018-05-31 DIAGNOSIS — R079 Chest pain, unspecified: Secondary | ICD-10-CM | POA: Diagnosis present

## 2018-05-31 DIAGNOSIS — R0789 Other chest pain: Secondary | ICD-10-CM

## 2018-05-31 DIAGNOSIS — R739 Hyperglycemia, unspecified: Secondary | ICD-10-CM

## 2018-05-31 DIAGNOSIS — Z79899 Other long term (current) drug therapy: Secondary | ICD-10-CM | POA: Insufficient documentation

## 2018-05-31 LAB — I-STAT BETA HCG BLOOD, ED (NOT ORDERABLE)

## 2018-05-31 LAB — BASIC METABOLIC PANEL
ANION GAP: 9 (ref 5–15)
BUN: 8 mg/dL (ref 6–20)
CALCIUM: 8.7 mg/dL — AB (ref 8.9–10.3)
CO2: 24 mmol/L (ref 22–32)
Chloride: 102 mmol/L (ref 98–111)
Creatinine, Ser: 0.9 mg/dL (ref 0.44–1.00)
Glucose, Bld: 482 mg/dL — ABNORMAL HIGH (ref 70–99)
Potassium: 3.9 mmol/L (ref 3.5–5.1)
Sodium: 135 mmol/L (ref 135–145)

## 2018-05-31 LAB — POCT I-STAT TROPONIN I: TROPONIN I, POC: 0 ng/mL (ref 0.00–0.08)

## 2018-05-31 LAB — CBC
HCT: 46 % (ref 36.0–46.0)
Hemoglobin: 14.1 g/dL (ref 12.0–15.0)
MCH: 22.7 pg — ABNORMAL LOW (ref 26.0–34.0)
MCHC: 30.7 g/dL (ref 30.0–36.0)
MCV: 74.2 fL — ABNORMAL LOW (ref 80.0–100.0)
NRBC: 0 % (ref 0.0–0.2)
PLATELETS: 333 10*3/uL (ref 150–400)
RBC: 6.2 MIL/uL — AB (ref 3.87–5.11)
RDW: 13.7 % (ref 11.5–15.5)
WBC: 7.8 10*3/uL (ref 4.0–10.5)

## 2018-05-31 MED ORDER — DIPHENHYDRAMINE HCL 50 MG/ML IJ SOLN
25.0000 mg | Freq: Once | INTRAMUSCULAR | Status: AC
Start: 2018-05-31 — End: 2018-05-31
  Administered 2018-05-31: 25 mg via INTRAVENOUS
  Filled 2018-05-31: qty 1

## 2018-05-31 MED ORDER — INSULIN ASPART 100 UNIT/ML IV SOLN
10.0000 [IU] | Freq: Once | INTRAVENOUS | Status: AC
  Administered 2018-05-31: 10 [IU] via INTRAVENOUS
  Filled 2018-05-31: qty 0.1

## 2018-05-31 MED ORDER — MORPHINE SULFATE (PF) 4 MG/ML IV SOLN
4.0000 mg | Freq: Once | INTRAVENOUS | Status: AC
  Administered 2018-05-31: 4 mg via INTRAVENOUS
  Filled 2018-05-31: qty 1

## 2018-05-31 NOTE — ED Triage Notes (Signed)
Pt states that she has been having central chest pain x 1 week that has been worsening 7/10 jabbing pain. Pt states she has associated SOB. Pt states that she had a PE in the past. Pt states that he symptoms feel similar.

## 2018-05-31 NOTE — ED Provider Notes (Signed)
[facility]-EMERGENCY DEPT Provider Note   CSN: [id number] Arrival date & time: 05/31/18  2119     History   Chief Complaint Chief Complaint  Patient presents with  . Chest Pain    HPI Shannon Oneal is a 37 y.o. female.  The history is provided by the patient.  She has history of asthma, diabetes, pulmonary embolism and comes in with pleuritic left-sided chest pain for the last week.  She describes a pain just to the left of the sternum with some radiation to the back.  Pain is described as a sharp feeling like something is moving around inside.  It is worse with a deep breath.  Pain is rated at 7/10.  This does feel similar to how she felt with her pulmonary embolism last January.  She has been taking ibuprofen and acetaminophen at home without relief.  She denies fever, chills, sweats.  She denies nausea or vomiting.  She denies any diaphoresis.  She is a non-smoker and denies history of hypertension or hyperlipidemia and denies family history of premature coronary atherosclerosis.  She denies recent travel, recent surgery, exogenous estrogen use.  Her prior pulmonary embolism also occurred without any provocation and was treated with a course of dabigatran.  Of note, she does note tendency to have swelling in the left ankle.  Past Medical History:  Diagnosis Date  . Asthma     There are no active problems to display for this patient.   History reviewed. No pertinent surgical history.   OB History   None      Home Medications    Prior to Admission medications   Medication Sig Start Date End Date Taking? Authorizing Provider  diazepam (VALIUM) 5 MG tablet Take 1 tablet (5 mg total) by mouth every 12 (twelve) hours as needed for muscle spasms. 10/12/17   Antony Madura, PA-C  doxycycline (VIBRAMYCIN) 100 MG capsule Take 1 capsule (100 mg total) by mouth 2 (two) times daily. Patient not taking: Reported on 10/12/2017 12/23/16   Roxy Horseman, PA-C    HYDROcodone-acetaminophen (NORCO/VICODIN) 5-325 MG tablet Take 1 tablet by mouth every 6 (six) hours as needed. Patient not taking: Reported on 10/12/2017 12/21/16   Anselm Pancoast, PA-C  ibuprofen (ADVIL,MOTRIN) 200 MG tablet Take 600 mg by mouth every 6 (six) hours as needed for pain or headache.     [provider]  insulin lispro protamine-lispro (HUMALOG 75/25 MIX) (75-25) 100 UNIT/ML SUSP injection Inject 30-60 Units into the skin 2 (two) times daily with a meal. 60 units in the am and 30 units in the pm    [provider]  lidocaine (LIDODERM) 5 % Place 1 patch onto the skin daily. Remove & Discard patch within 12 hours or as directed by MD 10/12/17   Antony Madura, PA-C  meloxicam (MOBIC) 7.5 MG tablet Take 2 tablets (15 mg total) by mouth daily. 10/12/17   Antony Madura, PA-C  metoCLOPramide (REGLAN) 10 MG tablet Take 1 tablet (10 mg total) by mouth every 6 (six) hours as needed (Nausea or headache). Patient not taking: Reported on 10/12/2017 02/18/13   Dione Booze, MD  oxyCODONE-acetaminophen (PERCOCET/ROXICET) 5-325 MG tablet Take 1 tablet by mouth every 6 (six) hours as needed for severe pain. 10/12/17   Antony Madura, PA-C    Family History History reviewed. No pertinent family history.  Social History Social History   Tobacco Use  . Smoking status: Never Smoker  . Smokeless tobacco: Never Used  Substance  Use Topics  . Alcohol use: Yes  . Drug use: Not on file     Allergies   Amoxapine and related; Amoxicillin; Tramadol; and Rocephin [ceftriaxone]   Review of Systems Review of Systems  All other systems reviewed and are negative.    Physical Exam Updated Vital Signs BP (!) 142/87 (BP Location: Left Arm)   Pulse 85   Temp 98 F (36.7 C) (Oral)   Resp 18   Ht 5' 5.5" (1.664 m)   Wt 110.7 kg   LMP 04/10/2018   SpO2 99%   BMI 39.99 kg/m   Physical Exam  Nursing note and vitals reviewed.  37 year old female, resting comfortably and in no acute  distress. Vital signs are significant for borderline elevated blood pressure. Oxygen saturation is 99%, which is normal. Head is normocephalic and atraumatic. PERRLA, EOMI. Oropharynx is clear. Neck is nontender and supple without adenopathy or JVD. Back is nontender and there is no CVA tenderness. Lungs are clear without rales, wheezes, or rhonchi. Chest is moderately tender over the lower sternum and left parasternal area.  There is no crepitus. Heart has regular rate and rhythm without murmur. Abdomen is soft, flat, nontender without masses or hepatosplenomegaly and peristalsis is normoactive. Extremities have no cyanosis or edema, full range of motion is present.  Left calf circumference is 1 cm greater than right calf circumference.  There is no calf tenderness and no palpable cord, but there is a positive Homans sign. Skin is warm and dry without rash. Neurologic: Mental status is normal, cranial nerves are intact, there are no motor or sensory deficits.  ED Treatments / Results  Labs (all labs ordered are listed, but only abnormal results are displayed) Labs Reviewed  BASIC METABOLIC PANEL - Abnormal; Notable for the following components:      Result Value   Glucose, Bld 482 (*)    Calcium 8.7 (*)    All other components within normal limits  CBC - Abnormal; Notable for the following components:   RBC 6.20 (*)    MCV 74.2 (*)    MCH 22.7 (*)    All other components within normal limits  I-STAT TROPONIN, ED  I-STAT BETA HCG BLOOD, ED ([facility], [facility], [facility] ONLY)  POCT I-STAT TROPONIN I  I-STAT BETA HCG BLOOD, ED (NOT ORDERABLE)    EKG EKG Interpretation  Date/Time:  Friday May 31 2018 21:38:11 EST Ventricular Rate:  87 PR Interval:    QRS Duration: 86 QT Interval:  348 QTC Calculation: 419 R Axis:   -57 Text Interpretation:  Sinus rhythm Left anterior fascicular block Low voltage, precordial leads Abnormal R-wave progression, late transition No old tracing to compare  Confirmed by Dione Booze ([id number]) on 05/31/2018 11:14:17 PM   Radiology Dg Chest 2 View  Result Date: 05/31/2018 CLINICAL DATA:  Chest pain. EXAM: CHEST - 2 VIEW COMPARISON:  None. FINDINGS: The heart size and mediastinal contours are within normal limits. Both lungs are clear. No pneumothorax or pleural effusion is noted. The visualized skeletal structures are unremarkable. IMPRESSION: No active cardiopulmonary disease. Electronically Signed   By: Lupita Raider, M.D.   On: 05/31/2018 21:58   Ct Angio Chest Pe W And/or Wo Contrast  Result Date: 06/01/2018 CLINICAL DATA:  Acute onset of dyspnea. Angina. EXAM: CT ANGIOGRAPHY CHEST WITH CONTRAST TECHNIQUE: Multidetector CT imaging of the chest was performed using the standard protocol during bolus administration of intravenous contrast. Multiplanar CT image reconstructions and MIPs were obtained to evaluate  the vascular anatomy. CONTRAST:  ISOVUE-370 IOPAMIDOL (ISOVUE-370) INJECTION 76% COMPARISON:  Chest radiograph performed 05/31/2018 FINDINGS: Cardiovascular:  There is no evidence of pulmonary embolus. The heart is normal in size. The thoracic aorta is unremarkable. The great vessels are within normal limits. Mediastinum/Nodes: The mediastinum is unremarkable. No mediastinal lymphadenopathy is seen. No pericardial effusion is identified. The thyroid gland is unremarkable. No axillary lymphadenopathy is seen. Lungs/Pleura: There is a mildly mosaic pattern of parenchymal attenuation within the lungs, nonspecific in appearance. The lungs are otherwise grossly clear. No pleural effusion or pneumothorax is seen. No masses are identified. Upper Abdomen: The visualized portions of the liver and spleen are unremarkable. The visualized portions of the pancreas, adrenal glands and kidneys are within normal limits. Musculoskeletal: No acute osseous abnormalities are identified. The visualized musculature is unremarkable in appearance. Review of the MIP images  confirms the above findings. IMPRESSION: 1. No evidence of pulmonary embolus. 2. Mildly mosaic pattern of parenchymal attenuation within the lungs, nonspecific in appearance. Lungs otherwise grossly clear. Electronically Signed   By: Roanna Raider M.D.   On: 06/01/2018 01:20    Procedures Procedures   Medications Ordered in ED Medications  morphine 4 MG/ML injection 4 mg (has no administration in time range)  diphenhydrAMINE (BENADRYL) injection 25 mg (has no administration in time range)  insulin aspart (novoLOG) injection 10 Units (has no administration in time range)     Initial Impression / Assessment and Plan / ED Course  I have reviewed the triage vital signs and the nursing notes.  Pertinent labs & imaging results that were available during my care of the patient were reviewed by me and considered in my medical decision making (see chart for details).  Pleuritic chest pain and patient with history of pulmonary embolism.  Initial work-up is unremarkable.  ECG shows no ST or T changes, chest x-ray is normal, troponin is undetectable.  Only significant finding on lab testing is elevated glucose of 482.  She is given a dose of insulin.  With history of pulmonary embolism without any provocation and slight left calf swelling, she will be sent for CT angiogram.  Pretest probability is too high to warrant screening with d-dimer.  She is given morphine for pain.  CT scan shows no evidence of pulmonary embolism or pneumonia.  She has had slight relief of pain with morphine.  She is given a dose of ketorolac and discharged with prescription for naproxen.  Told to apply ice or heat, use acetaminophen as needed for additional pain relief.  Return precautions discussed.  Final Clinical Impressions(s) / ED Diagnoses   Final diagnoses:  Chest wall pain  Hyperglycemia    ED Discharge Orders         Ordered    naproxen (NAPROSYN) 500 MG tablet  2 times daily     06/01/18 0129             Dione Booze, MD 06/01/18 0131

## 2018-05-31 NOTE — ED Notes (Signed)
Provider at bedside

## 2018-06-01 ENCOUNTER — Encounter (HOSPITAL_COMMUNITY): Payer: Self-pay

## 2018-06-01 ENCOUNTER — Emergency Department (HOSPITAL_COMMUNITY): Payer: No Typology Code available for payment source

## 2018-06-01 MED ORDER — NAPROXEN 500 MG PO TABS: 500.0000 mg | ORAL_TABLET | Freq: Two times a day (BID) | ORAL | 0 refills | Status: DC

## 2018-06-01 MED ORDER — IOPAMIDOL (ISOVUE-370) INJECTION 76%
INTRAVENOUS | Status: AC
  Filled 2018-06-01: qty 100

## 2018-06-01 MED ORDER — SODIUM CHLORIDE (PF) 0.9 % IJ SOLN
INTRAMUSCULAR | Status: AC
  Filled 2018-06-01: qty 50

## 2018-06-01 MED ORDER — KETOROLAC TROMETHAMINE 30 MG/ML IJ SOLN
30.0000 mg | Freq: Once | INTRAMUSCULAR | Status: AC
  Administered 2018-06-01: 30 mg via INTRAVENOUS
  Filled 2018-06-01: qty 1

## 2018-06-01 MED ORDER — IOPAMIDOL (ISOVUE-370) INJECTION 76%
100.0000 mL | Freq: Once | INTRAVENOUS | Status: AC | PRN
  Administered 2018-06-01: 100 mL via INTRAVENOUS

## 2018-06-01 NOTE — ED Notes (Signed)
Pt verbalized discharge instructions and stated she would like to wait in the lobby for her husband to pick her up

## 2018-06-01 NOTE — Discharge Instructions (Signed)
Apply ice or heat as needed.  Take acetaminophen as needed for additional pain relief.

## 2018-09-02 ENCOUNTER — Emergency Department (HOSPITAL_COMMUNITY)
Admission: EM | Admit: 2018-09-02 | Discharge: 2018-09-02 | Disposition: A | Discharge status: Home / Self Care | Payer: No Typology Code available for payment source | Attending: Emergency Medicine | Admitting: Emergency Medicine

## 2018-09-02 ENCOUNTER — Emergency Department (HOSPITAL_COMMUNITY): Payer: No Typology Code available for payment source

## 2018-09-02 ENCOUNTER — Encounter (HOSPITAL_COMMUNITY): Payer: Self-pay

## 2018-09-02 DIAGNOSIS — R519 Headache, unspecified: Secondary | ICD-10-CM

## 2018-09-02 DIAGNOSIS — R079 Chest pain, unspecified: Secondary | ICD-10-CM

## 2018-09-02 DIAGNOSIS — G43909 Migraine, unspecified, not intractable, without status migrainosus: Secondary | ICD-10-CM | POA: Insufficient documentation

## 2018-09-02 DIAGNOSIS — G43809 Other migraine, not intractable, without status migrainosus: Secondary | ICD-10-CM

## 2018-09-02 DIAGNOSIS — R0789 Other chest pain: Secondary | ICD-10-CM | POA: Diagnosis not present

## 2018-09-02 DIAGNOSIS — R51 Headache: Secondary | ICD-10-CM

## 2018-09-02 DIAGNOSIS — Z79899 Other long term (current) drug therapy: Secondary | ICD-10-CM | POA: Diagnosis not present

## 2018-09-02 DIAGNOSIS — J45909 Unspecified asthma, uncomplicated: Secondary | ICD-10-CM | POA: Diagnosis not present

## 2018-09-02 LAB — BASIC METABOLIC PANEL
Anion gap: 6 (ref 5–15)
BUN: 11 mg/dL (ref 6–20)
CALCIUM: 9 mg/dL (ref 8.9–10.3)
CO2: 23 mmol/L (ref 22–32)
CREATININE: 0.75 mg/dL (ref 0.44–1.00)
Chloride: 104 mmol/L (ref 98–111)
GFR calc Af Amer: >60 mL/min (ref >60)
GLUCOSE: 324 mg/dL — AB (ref 70–99)
Potassium: 4.5 mmol/L (ref 3.5–5.1)
SODIUM: 133 mmol/L — AB (ref 135–145)

## 2018-09-02 LAB — CBC
HCT: 47.7 % — ABNORMAL HIGH (ref 36.0–46.0)
Hemoglobin: 14.5 g/dL (ref 12.0–15.0)
MCH: 23.5 pg — AB (ref 26.0–34.0)
MCHC: 30.4 g/dL (ref 30.0–36.0)
MCV: 77.4 fL — ABNORMAL LOW (ref 80.0–100.0)
PLATELETS: 298 10*3/uL (ref 150–400)
RBC: 6.16 MIL/uL — ABNORMAL HIGH (ref 3.87–5.11)
RDW: 14.2 % (ref 11.5–15.5)
WBC: 8.8 10*3/uL (ref 4.0–10.5)
nRBC: 0 % (ref 0.0–0.2)

## 2018-09-02 LAB — I-STAT BETA HCG BLOOD, ED (MC, WL, AP ONLY): I-stat hCG, quantitative: <5 m[IU]/mL (ref <5)

## 2018-09-02 LAB — D-DIMER, QUANTITATIVE: D-Dimer, Quant: 0.37 ug/mL-FEU (ref 0.00–0.50)

## 2018-09-02 LAB — I-STAT TROPONIN, ED
TROPONIN I, POC: 0 ng/mL (ref 0.00–0.08)
Troponin i, poc: 0 ng/mL (ref 0.00–0.08)

## 2018-09-02 MED ORDER — KETOROLAC TROMETHAMINE 15 MG/ML IJ SOLN
15.0000 mg | Freq: Once | INTRAMUSCULAR | Status: AC
  Administered 2018-09-02: 15 mg via INTRAVENOUS
  Filled 2018-09-02: qty 1

## 2018-09-02 MED ORDER — ASPIRIN 81 MG PO CHEW
324.0000 mg | CHEWABLE_TABLET | Freq: Once | ORAL | Status: AC
  Administered 2018-09-02: 324 mg via ORAL
  Filled 2018-09-02: qty 4

## 2018-09-02 MED ORDER — SODIUM CHLORIDE 0.9% FLUSH
3.0000 mL | Freq: Once | INTRAVENOUS | Status: AC
  Administered 2018-09-02: 3 mL via INTRAVENOUS

## 2018-09-02 MED ORDER — METOCLOPRAMIDE HCL 5 MG/ML IJ SOLN
10.0000 mg | Freq: Once | INTRAMUSCULAR | Status: AC
  Administered 2018-09-02: 10 mg via INTRAVENOUS
  Filled 2018-09-02: qty 2

## 2018-09-02 NOTE — Discharge Instructions (Addendum)
We saw you in the ER for the chest pain and the headaches. All of our cardiac workup is normal, including labs, EKG and chest X-RAY are normal. We are not sure what is causing your discomfort, but we feel comfortable sending you home at this time.   The workup in the ER is not complete, and you should follow up with your primary care doctor for further evaluation.  We are also providing you with contact information of the neurologist, you might want to consider seeing them if the headaches persist..  Please return to the ER if you have worsening chest pain, shortness of breath, pain radiating to your jaw, shoulder, or back, sweats or fainting.  Please return to the ER if the headache gets severe and in not improving, you have associated new one sided numbness, tingling, weakness or confusion, seizures, poor balance or poor vision.

## 2018-09-02 NOTE — ED Triage Notes (Signed)
Pt presents with c/o chest pain in the center of her chest, jabbing in nature. Pt also c/o associated nausea and dizzy as well. Pt reports that the pain started this morning around 1 am this morning.

## 2018-09-02 NOTE — ED Provider Notes (Signed)
[facility]-EMERGENCY DEPT Provider Note   CSN: [id number] Arrival date & time: 09/02/18  [id number]     History   Chief Complaint Chief Complaint  Patient presents with  . Chest Pain    HPI Shannon Oneal is a 38 y.o. female.  HPI  38 year old female comes in with chief complaint of headache, chest pain.  Patient reports that she started having headache yesterday.  Headaches are left-sided and described as sharp pain.  She has no specific aggravating or relieving factors.  Review of system is positive for bilateral " fuzzy" vision.  Patient has history of migraines, but states that her typical migraine headaches are posterior and she has photophobia and phonophobia, neither of those symptoms exist today.  She denies any associated trauma.  There is no other focal neuro complaints.  Additionally patient is coming in with chest pain.  She states that her chest pain started earlier today and it is located on the left side and radiating to the scapular region.  She has associated shortness of breath, and her shortness of breath is worse with exertion.  Patient's pain is also worse with deep inspiration.  She states that her pain is similar to her pain she had in November, 2019 and also similar to the pain she was having when she was diagnosed with PE.  Patient does not have any history of ACS-CAD.  She does not smoke and there is no substance abuse history.  Patient also denies any premature CAD in the family.  Past Medical History:  Diagnosis Date  . Asthma     There are no active problems to display for this patient.   History reviewed. No pertinent surgical history.   OB History   No obstetric history on file.      Home Medications    Prior to Admission medications   Medication Sig Start Date End Date Taking? Authorizing Provider  glipiZIDE (GLUCOTROL) 5 MG tablet Take 5 mg by mouth 2 (two) times daily before a meal. 08/21/18  Yes [provider]    ibuprofen (ADVIL,MOTRIN) 200 MG tablet Take 600 mg by mouth every 6 (six) hours as needed for pain or headache.    Yes [provider]  mupirocin ointment (BACTROBAN) 2 % Apply 1 application topically 3 (three) times daily. 08/21/18  Yes [provider]  NOVOLIN N RELION 100 UNIT/ML injection Inject 10 Units into the skin 2 (two) times daily. 08/21/18  Yes [provider]  sulfamethoxazole-trimethoprim (BACTRIM DS,SEPTRA DS) 800-160 MG tablet Take 1 tablet by mouth every 12 (twelve) hours. For 10 days 08/21/18  Yes [provider]  diazepam (VALIUM) 5 MG tablet Take 1 tablet (5 mg total) by mouth every 12 (twelve) hours as needed for muscle spasms. Patient not taking: Reported on 09/02/2018 10/12/17   Antony MaduraHumes, Kelly, PA-C  doxycycline (VIBRAMYCIN) 100 MG capsule Take 1 capsule (100 mg total) by mouth 2 (two) times daily. Patient not taking: Reported on 10/12/2017 12/23/16   Roxy HorsemanBrowning, Robert, PA-C  HYDROcodone-acetaminophen (NORCO/VICODIN) 5-325 MG tablet Take 1 tablet by mouth every 6 (six) hours as needed. Patient not taking: Reported on 10/12/2017 12/21/16   Joy, Ines BloomerShawn C, PA-C  lidocaine (LIDODERM) 5 % Place 1 patch onto the skin daily. Remove & Discard patch within 12 hours or as directed by MD Patient not taking: Reported on 09/02/2018 10/12/17   Antony MaduraHumes, Kelly, PA-C  meloxicam (MOBIC) 7.5 MG tablet Take 2 tablets (15 mg total) by mouth daily. Patient not  taking: Reported on 09/02/2018 10/12/17   Antony MaduraHumes, Kelly, PA-C  metoCLOPramide (REGLAN) 10 MG tablet Take 1 tablet (10 mg total) by mouth every 6 (six) hours as needed (Nausea or headache). Patient not taking: Reported on 10/12/2017 02/18/13   Dione BoozeGlick, David, MD  naproxen (NAPROSYN) 500 MG tablet Take 1 tablet (500 mg total) by mouth 2 (two) times daily. Patient not taking: Reported on 09/02/2018 06/01/18   Dione BoozeGlick, David, MD  oxyCODONE-acetaminophen (PERCOCET/ROXICET) 5-325 MG tablet Take 1 tablet by mouth every 6 (six) hours as  needed for severe pain. Patient not taking: Reported on 09/02/2018 10/12/17   Antony MaduraHumes, Kelly, PA-C    Family History History reviewed. No pertinent family history.  Social History Social History   Tobacco Use  . Smoking status: Never Smoker  . Smokeless tobacco: Never Used  Substance Use Topics  . Alcohol use: Yes  . Drug use: Not on file     Allergies   Amoxapine and related; Amoxicillin; Tramadol; and Rocephin [ceftriaxone]   Review of Systems Review of Systems  Constitutional: Positive for activity change.  Eyes: Positive for visual disturbance.  Respiratory: Positive for shortness of breath.   Cardiovascular: Positive for chest pain.  Gastrointestinal: Negative for nausea.  Neurological: Positive for headaches.  All other systems reviewed and are negative.    Physical Exam Updated Vital Signs BP 126/85   Pulse 80   Temp 97.6 F (36.4 C) (Oral)   Resp 17   Ht 5\' 6"  (1.676 m)   Wt 106.1 kg   LMP 07/21/2018 (Approximate)   SpO2 99%   BMI 37.77 kg/m   Physical Exam Vitals signs and nursing note reviewed.  Constitutional:      Appearance: She is well-developed.  HENT:     Head: Normocephalic and atraumatic.  Neck:     Musculoskeletal: Normal range of motion and neck supple.  Cardiovascular:     Rate and Rhythm: Normal rate.  Pulmonary:     Effort: Pulmonary effort is normal.     Breath sounds: Normal breath sounds. No decreased breath sounds, wheezing or rhonchi.  Abdominal:     General: Bowel sounds are normal.  Skin:    General: Skin is warm and dry.  Neurological:     Mental Status: She is alert and oriented to person, place, and time.      ED Treatments / Results  Labs (all labs ordered are listed, but only abnormal results are displayed) Labs Reviewed  BASIC METABOLIC PANEL - Abnormal; Notable for the following components:      Result Value   Sodium 133 (*)    Glucose, Bld 324 (*)    All other components within normal limits  CBC -  Abnormal; Notable for the following components:   RBC 6.16 (*)    HCT 47.7 (*)    MCV 77.4 (*)    MCH 23.5 (*)    All other components within normal limits  D[facility], QUANTITAT[facility] ([facility])  I-STAT TROPONIN, ED  I-STAT BETA HCG BLOOD, ED ([facility], [facility], [facility] ONLY)  I-STAT TROPONIN, ED    EKG EKG Interpretation  Date/Time:  Monday September 02 2018 08:41:55 EST Ventricular Rate:  85 PR Interval:    QRS Duration: 82 QT Interval:  362 QTC Calculation: 431 R Axis:   -57 Text Interpretation:  Sinus rhythm Left anterior fascicular block Low voltage, precordial leads Consider anterior infarct No acute changes No significant change since last tracing Confirmed by Derwood KaplanNanavati, [place] Serpe ([id number]) on 09/02/2018 10:04:47 AM  Radiology Dg Chest 2 View  Result Date: 09/02/2018 CLINICAL DATA:  Chest pain EXAM: CHEST - 2 VIEW COMPARISON:  Chest CT June 01, 2018 and chest radiograph May 31, 2018 FINDINGS: There is no appreciable edema or consolidation. The heart size and pulmonary vascularity are normal. No adenopathy. No pneumothorax. No bone lesions. IMPRESSION: No edema or consolidation. Electronically Signed   By: Bretta Bang III M.D.   On: 09/02/2018 08:56    Procedures Procedures (including critical care time)  Medications Ordered in ED Medications  sodium chloride flush (NS) 0.9 % injection 3 mL (3 mLs Intravenous Given 09/02/18 0859)  aspirin chewable tablet 324 mg (324 mg Oral Given 09/02/18 1015)  ketorolac (TORADOL) 15 MG/ML injection 15 mg (15 mg Intravenous Given 09/02/18 1016)  metoCLOPramide (REGLAN) injection 10 mg (10 mg Intravenous Given 09/02/18 1016)     Initial Impression / Assessment and Plan / ED Course  I have reviewed the triage vital signs and the nursing notes.  Pertinent labs & imaging results that were available during my care of the patient were reviewed by me and considered in my medical decision making (see chart for details).  Clinical Course as of Sep 02 1310  Mon Sep 02, 2018  1309 Results from the ER workup discussed with the patient face to face and all questions answered to the best of my ability.  Patient reassessed.  She continues to have nonfocal neurologic exam.  Her chest pain has improved.  Delta troponin is reassuring.  Patient also made aware of d-dimer being normal -and therefore not getting CT PE.  She has been advised to follow-up with her PCP and consider following up with neurology for her complex headache.  Strict ER return precautions discussed with the patient about both PE, ACS and neurologic emergency.  Patient and her family members are in agreement with the plan.   [AN]    Clinical Course User Index [AN] Derwood Kaplan, MD    38 year old female comes in a chief complaint of chest pain.  She is also complaining of left-sided headache along with " fuzzy" vision.  Patient has history of PE, not on any anticoagulants at this time.  She also has history of migraines, but reports that the current headache is different than her migrainous headaches.  Patient neuro exam is nonfocal. Her lung exam and cardiovascular exam are also normal.  There is no signs of DVT.  Differential diagnosis includes PE along with complex headache.  I doubt that patient has acute brain bleed as her symptoms are binocular.  For the same reason it is also does not appear that patient has carotid dissection.  Our plan is to get d-dimer.  The reason for getting a d-dimer is because patient's Wells score is 1.5 given her history of PE.  We will also get delta troponin to rule out ACS.  Final Clinical Impressions(s) / ED Diagnoses   Final diagnoses:  Nonspecific chest pain  Bad headache  Other migraine without status migrainosus, not intractable    ED Discharge Orders    None       Derwood Kaplan, MD 09/02/18 1312

## 2019-10-27 ENCOUNTER — Emergency Department (HOSPITAL_COMMUNITY)
Admission: EM | Admit: 2019-10-27 | Discharge: 2019-10-27 | Disposition: A | Discharge status: Home / Self Care | Payer: 59 | Attending: Emergency Medicine | Admitting: Emergency Medicine

## 2019-10-27 ENCOUNTER — Encounter (HOSPITAL_COMMUNITY): Payer: Self-pay | Admitting: Emergency Medicine

## 2019-10-27 ENCOUNTER — Other Ambulatory Visit: Payer: Self-pay

## 2019-10-27 DIAGNOSIS — Z79899 Other long term (current) drug therapy: Secondary | ICD-10-CM | POA: Diagnosis not present

## 2019-10-27 DIAGNOSIS — E1165 Type 2 diabetes mellitus with hyperglycemia: Secondary | ICD-10-CM | POA: Insufficient documentation

## 2019-10-27 DIAGNOSIS — M79673 Pain in unspecified foot: Secondary | ICD-10-CM | POA: Diagnosis not present

## 2019-10-27 DIAGNOSIS — Z794 Long term (current) use of insulin: Secondary | ICD-10-CM | POA: Insufficient documentation

## 2019-10-27 DIAGNOSIS — R739 Hyperglycemia, unspecified: Secondary | ICD-10-CM

## 2019-10-27 DIAGNOSIS — M79606 Pain in leg, unspecified: Secondary | ICD-10-CM | POA: Insufficient documentation

## 2019-10-27 HISTORY — DX: Type 2 diabetes mellitus without complications: E11.9

## 2019-10-27 LAB — BASIC METABOLIC PANEL
Anion gap: 7 (ref 5–15)
BUN: 9 mg/dL (ref 6–20)
CO2: 25 mmol/L (ref 22–32)
Calcium: 9.2 mg/dL (ref 8.9–10.3)
Chloride: 105 mmol/L (ref 98–111)
Creatinine, Ser: 0.73 mg/dL (ref 0.44–1.00)
GFR calc Af Amer: >60 mL/min (ref >60)
GFR calc non Af Amer: >60 mL/min (ref >60)
Glucose, Bld: 333 mg/dL — ABNORMAL HIGH (ref 70–99)
Potassium: 4.1 mmol/L (ref 3.5–5.1)
Sodium: 137 mmol/L (ref 135–145)

## 2019-10-27 LAB — URINALYSIS, ROUTINE W REFLEX MICROSCOPIC
Bacteria, UA: NONE SEEN
Bilirubin Urine: NEGATIVE
Glucose, UA: >=500 mg/dL — AB
Hgb urine dipstick: NEGATIVE
Ketones, ur: NEGATIVE mg/dL
Leukocytes,Ua: NEGATIVE
Nitrite: NEGATIVE
Protein, ur: NEGATIVE mg/dL
Specific Gravity, Urine: 1.035 — ABNORMAL HIGH (ref 1.005–1.030)
pH: 5 (ref 5.0–8.0)

## 2019-10-27 LAB — CBG MONITORING, ED
Glucose-Capillary: 291 mg/dL — ABNORMAL HIGH (ref 70–99)
Glucose-Capillary: 274 mg/dL — ABNORMAL HIGH (ref 70–99)

## 2019-10-27 LAB — CBC
HCT: 47.9 % — ABNORMAL HIGH (ref 36.0–46.0)
Hemoglobin: 14.9 g/dL (ref 12.0–15.0)
MCH: 23.3 pg — ABNORMAL LOW (ref 26.0–34.0)
MCHC: 31.1 g/dL (ref 30.0–36.0)
MCV: 74.8 fL — ABNORMAL LOW (ref 80.0–100.0)
Platelets: 341 10*3/uL (ref 150–400)
RBC: 6.4 MIL/uL — ABNORMAL HIGH (ref 3.87–5.11)
RDW: 13.6 % (ref 11.5–15.5)
WBC: 8.5 10*3/uL (ref 4.0–10.5)
nRBC: 0 % (ref 0.0–0.2)

## 2019-10-27 LAB — I-STAT BETA HCG BLOOD, ED (MC, WL, AP ONLY): I-stat hCG, quantitative: 7.9 m[IU]/mL — ABNORMAL HIGH (ref <5)

## 2019-10-27 MED ORDER — SODIUM CHLORIDE 0.9 % IV SOLN: INTRAVENOUS | Status: DC

## 2019-10-27 MED ORDER — SODIUM CHLORIDE 0.9 % IV BOLUS
1000.0000 mL | Freq: Once | INTRAVENOUS | Status: AC
  Administered 2019-10-27: 12:00:00 1000 mL via INTRAVENOUS

## 2019-10-27 MED ORDER — HYDROCODONE-ACETAMINOPHEN 5-325 MG PO TABS: 1.0000 | ORAL_TABLET | ORAL | 0 refills | Status: DC | PRN

## 2019-10-27 NOTE — ED Provider Notes (Signed)
[facility] DEPT Provider Note   CSN: [id number] Arrival date & time: 10/27/19  1110     History Chief Complaint  Patient presents with  . Hyperglycemia  . Leg Pain  . Foot Pain    Ronnie Mallette is a 39 y.o. female.  39 year old female with history of type 2 diabetes presents with persistent hyperglycemia with associated polyuria and polydipsia.  Patient has not been following a strict diabetic diet and does have simple sugars and her current eating pattern.  No vomiting or diarrhea.  No fever or chills.   has been compliant with her diabetic medications.  States this morning her sugar read high and she came here.        Past Medical History:  Diagnosis Date  . Asthma   . Diabetes mellitus without complication ([facility])     There are no problems to display for this patient.   History reviewed. No pertinent surgical history.   OB History   No obstetric history on file.     No family history on file.  Social History   Tobacco Use  . Smoking status: Never Smoker  . Smokeless tobacco: Never Used  Substance Use Topics  . Alcohol use: Yes  . Drug use: Not on file    Home Medications Prior to Admission medications   Medication Sig Start Date End Date Taking? Authorizing Provider  diazepam (VALIUM) 5 MG tablet Take 1 tablet (5 mg total) by mouth every 12 (twelve) hours as needed for muscle spasms. Patient not taking: Reported on 09/02/2018 10/12/17   Antonietta Breach, PA-C  doxycycline (VIBRAMYCIN) 100 MG capsule Take 1 capsule (100 mg total) by mouth 2 (two) times daily. Patient not taking: Reported on 10/12/2017 12/23/16   Montine Circle, PA-C  glipiZIDE (GLUCOTROL) 5 MG tablet Take 5 mg by mouth 2 (two) times daily before a meal. 08/21/18   [provider]  HYDROcodone-acetaminophen (NORCO/VICODIN) 5-325 MG tablet Take 1 tablet by mouth every 6 (six) hours as needed. Patient not taking: Reported on 10/12/2017 12/21/16   Lorayne Bender, PA-C   ibuprofen (ADVIL,MOTRIN) 200 MG tablet Take 600 mg by mouth every 6 (six) hours as needed for pain or headache.     [provider]  lidocaine (LIDODERM) 5 % Place 1 patch onto the skin daily. Remove & Discard patch within 12 hours or as directed by MD Patient not taking: Reported on 09/02/2018 10/12/17   Antonietta Breach, PA-C  meloxicam (MOBIC) 7.5 MG tablet Take 2 tablets (15 mg total) by mouth daily. Patient not taking: Reported on 09/02/2018 10/12/17   Antonietta Breach, PA-C  metoCLOPramide (REGLAN) 10 MG tablet Take 1 tablet (10 mg total) by mouth every 6 (six) hours as needed (Nausea or headache). Patient not taking: Reported on 6/72/0947 0/96/28   Delora Fuel, MD  mupirocin ointment (BACTROBAN) 2 % Apply 1 application topically 3 (three) times daily. 08/21/18   [provider]  naproxen (NAPROSYN) 500 MG tablet Take 1 tablet (500 mg total) by mouth 2 (two) times daily. Patient not taking: Reported on 3/66/2947 65/4/65   Delora Fuel, MD  NOVOLIN N RELION 100 UNIT/ML injection Inject 10 Units into the skin 2 (two) times daily. 08/21/18   [provider]  oxyCODONE-acetaminophen (PERCOCET/ROXICET) 5-325 MG tablet Take 1 tablet by mouth every 6 (six) hours as needed for severe pain. Patient not taking: Reported on 09/02/2018 10/12/17   Antonietta Breach, PA-C  sulfamethoxazole-trimethoprim (BACTRIM DS,SEPTRA DS) 800-160 MG tablet Take 1  tablet by mouth every 12 (twelve) hours. For 10 days 08/21/18   [provider]    Allergies    Amoxapine and related, Amoxicillin, Tramadol, and Rocephin [ceftriaxone]  Review of Systems   Review of Systems  All other systems reviewed and are negative.   Physical Exam Updated Vital Signs BP (!) 148/101 (BP Location: Left Arm)   Pulse 87   Temp 98 F (36.7 C) (Oral)   Resp 17   SpO2 98%   Physical Exam Vitals and nursing note reviewed.  Constitutional:      General: She is not in acute distress.    Appearance: Normal  appearance. She is well-developed. She is not toxic-appearing.  HENT:     Head: Normocephalic and atraumatic.  Eyes:     General: Lids are normal.     Conjunctiva/sclera: Conjunctivae normal.     Pupils: Pupils are equal, round, and reactive to light.  Neck:     Thyroid: No thyroid mass.     Trachea: No tracheal deviation.  Cardiovascular:     Rate and Rhythm: Normal rate and regular rhythm.     Heart sounds: Normal heart sounds. No murmur. No gallop.   Pulmonary:     Effort: Pulmonary effort is normal. No respiratory distress.     Breath sounds: Normal breath sounds. No stridor. No decreased breath sounds, wheezing, rhonchi or rales.  Abdominal:     General: Bowel sounds are normal. There is no distension.     Palpations: Abdomen is soft.     Tenderness: There is no abdominal tenderness. There is no rebound.  Musculoskeletal:        General: No tenderness. Normal range of motion.     Cervical back: Normal range of motion and neck supple.  Skin:    General: Skin is warm and dry.     Findings: No abrasion or rash.  Neurological:     Mental Status: She is alert and oriented to person, place, and time.     GCS: GCS eye subscore is 4. GCS verbal subscore is 5. GCS motor subscore is 6.     Cranial Nerves: No cranial nerve deficit.     Sensory: No sensory deficit.  Psychiatric:        Speech: Speech normal.        Behavior: Behavior normal.     ED Results / Procedures / Treatments   Labs (all labs ordered are listed, but only abnormal results are displayed) Labs Reviewed  CBG MONITORING, ED - Abnormal; Notable for the following components:      Result Value   Glucose-Capillary 291 (*)    All other components within normal limits  BASIC METABOLIC PANEL  CBC  URINALYSIS, ROUTINE W REFLEX MICROSCOPIC  I-STAT BETA HCG BLOOD, ED ([facility], [facility], [facility] ONLY)    EKG None  Radiology No results found.  Procedures Procedures (including critical care time)  Medications Ordered in  ED Medications  sodium chloride 0.9 % bolus 1,000 mL (has no administration in time range)  0.9 %  sodium chloride infusion (has no administration in time range)    ED Course  I have reviewed the triage vital signs and the nursing notes.  Pertinent labs & imaging results that were available during my care of the patient were reviewed by me and considered in my medical decision making (see chart for details).    MDM Rules/Calculators/A&P  Patient given IV fluids here and blood sugar has improved.  Patient also notes having intermittent bilateral lower extremity pain for several weeks which seems to wax and wane.  Concern for diabetic neuropathy.  Will place on Ultram for this and patient instructed to keep to a strict diabetic diet and follow-up with her PCP who prescribes her diabetic medication Final Clinical Impression(s) / ED Diagnoses Final diagnoses:  None    Rx / DC Orders ED Discharge Orders    None       Lorre Nick, MD 10/27/19 1331

## 2019-10-27 NOTE — ED Triage Notes (Signed)
Pt c/o bilat feet and leg pains for a couple weeks. Reports that since the weekend her blood sugars have been reading high and been all over the place, reports not responding to her medications.

## 2019-10-27 NOTE — ED Notes (Signed)
An After Visit Summary was printed and given to the patient. Discharge instructions given and no further questions at this time.  

## 2019-10-31 ENCOUNTER — Emergency Department (HOSPITAL_COMMUNITY)
Admission: EM | Admit: 2019-10-31 | Discharge: 2019-10-31 | Disposition: A | Discharge status: Home / Self Care | Payer: 59 | Attending: Emergency Medicine | Admitting: Emergency Medicine

## 2019-10-31 ENCOUNTER — Other Ambulatory Visit: Payer: Self-pay

## 2019-10-31 ENCOUNTER — Emergency Department (HOSPITAL_COMMUNITY): Payer: 59

## 2019-10-31 ENCOUNTER — Encounter (HOSPITAL_COMMUNITY): Payer: Self-pay

## 2019-10-31 DIAGNOSIS — E1165 Type 2 diabetes mellitus with hyperglycemia: Secondary | ICD-10-CM | POA: Insufficient documentation

## 2019-10-31 DIAGNOSIS — L299 Pruritus, unspecified: Secondary | ICD-10-CM | POA: Diagnosis not present

## 2019-10-31 DIAGNOSIS — Z794 Long term (current) use of insulin: Secondary | ICD-10-CM | POA: Diagnosis not present

## 2019-10-31 DIAGNOSIS — E1142 Type 2 diabetes mellitus with diabetic polyneuropathy: Secondary | ICD-10-CM | POA: Diagnosis not present

## 2019-10-31 DIAGNOSIS — R739 Hyperglycemia, unspecified: Secondary | ICD-10-CM

## 2019-10-31 LAB — CBC
HCT: 45.3 % (ref 36.0–46.0)
Hemoglobin: 14.3 g/dL (ref 12.0–15.0)
MCH: 23.4 pg — ABNORMAL LOW (ref 26.0–34.0)
MCHC: 31.6 g/dL (ref 30.0–36.0)
MCV: 74.3 fL — ABNORMAL LOW (ref 80.0–100.0)
Platelets: 323 10*3/uL (ref 150–400)
RBC: 6.1 MIL/uL — ABNORMAL HIGH (ref 3.87–5.11)
RDW: 13.7 % (ref 11.5–15.5)
WBC: 8 10*3/uL (ref 4.0–10.5)
nRBC: 0 % (ref 0.0–0.2)

## 2019-10-31 LAB — BASIC METABOLIC PANEL
Anion gap: 8 (ref 5–15)
BUN: 8 mg/dL (ref 6–20)
CO2: 22 mmol/L (ref 22–32)
Calcium: 9.1 mg/dL (ref 8.9–10.3)
Chloride: 105 mmol/L (ref 98–111)
Creatinine, Ser: 0.77 mg/dL (ref 0.44–1.00)
GFR calc Af Amer: >60 mL/min (ref >60)
GFR calc non Af Amer: >60 mL/min (ref >60)
Glucose, Bld: 316 mg/dL — ABNORMAL HIGH (ref 70–99)
Potassium: 4.1 mmol/L (ref 3.5–5.1)
Sodium: 135 mmol/L (ref 135–145)

## 2019-10-31 LAB — I-STAT BETA HCG BLOOD, ED (MC, WL, AP ONLY): I-stat hCG, quantitative: <5 m[IU]/mL (ref <5)

## 2019-10-31 LAB — TROPONIN I (HIGH SENSITIVITY)
Troponin I (High Sensitivity): 3 ng/L (ref <18)
Troponin I (High Sensitivity): 3 ng/L (ref <18)

## 2019-10-31 LAB — CBG MONITORING, ED
Glucose-Capillary: 297 mg/dL — ABNORMAL HIGH (ref 70–99)
Glucose-Capillary: 231 mg/dL — ABNORMAL HIGH (ref 70–99)

## 2019-10-31 MED ORDER — NOVOLIN N RELION 100 UNIT/ML ~~LOC~~ SUSP: 10.0000 [IU] | Freq: Two times a day (BID) | SUBCUTANEOUS | 0 refills | Status: DC

## 2019-10-31 MED ORDER — SODIUM CHLORIDE 0.9% FLUSH: 3.0000 mL | Freq: Once | INTRAVENOUS | Status: DC

## 2019-10-31 MED ORDER — GABAPENTIN 300 MG PO CAPS: 300.0000 mg | ORAL_CAPSULE | Freq: Three times a day (TID) | ORAL | 0 refills | Status: DC

## 2019-10-31 NOTE — ED Provider Notes (Signed)
[facility] EMERGENCY DEPARTMENT Provider Note  CSN: [id number] Arrival date & time: 10/31/19 1646    History No chief complaint on file.   HPI  Shannon Oneal is a 39 y.o. female presents to the emerge department for evaluation of elevated blood sugar, pain and tingling in her legs and itching in her chest.  She has a known history of diabetes, has run out of her Toujeo recently and has been unable to get an appointment with her PCP for the next few weeks. She has had pain in her legs felt to be neuropathy, has had Lyrica in the past without improvement but did cause her some sleepiness. Has not taken Neurontin. She also reports an 'itching' sensation in her chest. Denies any SOB, cough, fever and states this started about the time the pollen started to get bad recently.    Past Medical History:  Diagnosis Date  . Asthma   . Diabetes mellitus without complication ([facility])     History reviewed. No pertinent surgical history.  No family history on file.  Social History   Tobacco Use  . Smoking status: Never Smoker  . Smokeless tobacco: Never Used  Substance Use Topics  . Alcohol use: Yes  . Drug use: Not on file     Home Medications Prior to Admission medications   Medication Sig Start Date End Date Taking? Authorizing Provider  gabapentin (NEURONTIN) 300 MG capsule Take 1 capsule (300 mg total) by mouth 3 (three) times daily. 10/31/19   Pollyann Savoy, MD  glipiZIDE (GLUCOTROL) 5 MG tablet Take 5 mg by mouth 2 (two) times daily before a meal. 08/21/18   [provider]  HYDROcodone-acetaminophen (NORCO/VICODIN) 5-325 MG tablet Take 1-2 tablets by mouth every 4 (four) hours as needed. 10/27/19   Lorre Nick, MD  NOVOLIN N RELION 100 UNIT/ML injection Inject 0.1 mLs (10 Units total) into the skin 2 (two) times daily. 10/31/19 11/30/19  Pollyann Savoy, MD  metoCLOPramide (REGLAN) 10 MG tablet Take 1 tablet (10 mg total) by mouth every 6 (six) hours as needed (Nausea or  headache). Patient not taking: Reported on 10/12/2017 02/18/13 10/31/19  Dione Booze, MD     Allergies    Amoxapine and related, Amoxicillin, Tramadol, and Rocephin [ceftriaxone]   Review of Systems   Review of Systems  Constitutional: Negative for fever.  HENT: Negative for congestion and sore throat.   Respiratory: Negative for cough and shortness of breath.   Cardiovascular: Negative for chest pain.       Itching in chest  Gastrointestinal: Negative for abdominal pain, diarrhea, nausea and vomiting.  Endocrine:       Hyperglycemia  Genitourinary: Negative for dysuria.  Musculoskeletal: Negative for myalgias.  Skin: Negative for rash.  Neurological: Negative for headaches.       Pain/numbness lower legs  Psychiatric/Behavioral: Negative for behavioral problems.     Physical Exam BP 116/69 (BP Location: Right Arm)   Pulse 80   Temp 98.3 F (36.8 C) (Oral)   Resp 16   SpO2 98%   Physical Exam Constitutional:      Appearance: Normal appearance.  HENT:     Head: Normocephalic and atraumatic.     Nose: Nose normal.     Mouth/Throat:     Mouth: Mucous membranes are moist.  Eyes:     Extraocular Movements: Extraocular movements intact.     Conjunctiva/sclera: Conjunctivae normal.  Cardiovascular:     Rate and Rhythm: Normal rate.  Pulmonary:  Effort: Pulmonary effort is normal.     Breath sounds: Normal breath sounds.  Abdominal:     General: Abdomen is flat.     Palpations: Abdomen is soft.     Tenderness: There is no abdominal tenderness.  Musculoskeletal:        General: No swelling. Normal range of motion.     Cervical back: Neck supple.  Skin:    General: Skin is warm and dry.  Neurological:     General: No focal deficit present.     Mental Status: She is alert.  Psychiatric:        Mood and Affect: Mood normal.      ED Results / Procedures / Treatments   Labs (all labs ordered are listed, but only abnormal results are displayed) Labs Reviewed    BASIC METABOLIC PANEL - Abnormal; Notable for the following components:      Result Value   Glucose, Bld 316 (*)    All other components within normal limits  CBC - Abnormal; Notable for the following components:   RBC 6.10 (*)    MCV 74.3 (*)    MCH 23.4 (*)    All other components within normal limits  CBG MONITORING, ED - Abnormal; Notable for the following components:   Glucose-Capillary 297 (*)    All other components within normal limits  CBG MONITORING, ED - Abnormal; Notable for the following components:   Glucose-Capillary 231 (*)    All other components within normal limits  I-STAT BETA HCG BLOOD, ED ([facility], [facility], [facility] ONLY)  TROPONIN I (HIGH SENSITIVITY)  TROPONIN I (HIGH SENSITIVITY)    EKG EKG Interpretation  Date/Time:  Friday October 31 2019 16:56:37 EDT Ventricular Rate:  81 PR Interval:  128 QRS Duration: 72 QT Interval:  356 QTC Calculation: 413 R Axis:   -57 Text Interpretation: Poor data quality, interpretation may be adversely affected Sinus rhythm with occasional Premature ventricular complexes Left axis deviation Abnormal ECG No significant change since last tracing Confirmed by [facility]  MD, Leonette Most [phone]) on 10/31/2019 9:09:52 PM   Radiology DG Chest 2 View  Result Date: 10/31/2019 CLINICAL DATA:  Chest pain, burning in chest EXAM: CHEST - 2 VIEW COMPARISON:  09/02/2018 FINDINGS: The heart size and mediastinal contours are within normal limits. Both lungs are clear. The visualized skeletal structures are unremarkable. IMPRESSION: No active cardiopulmonary disease. Electronically Signed   By: Jasmine Pang M.D.   On: 10/31/2019 19:03    Procedures Procedures  Medications Ordered in the ED Medications  sodium chloride flush (NS) 0.9 % injection 3 mL (has no administration in time range)     ED Course  I have reviewed the triage vital signs and the nursing notes.  Pertinent labs & imaging results that were available during my care of the patient were  reviewed by me and considered in my medical decision making (see chart for details).     MDM Rules/Calculators/A&P MDM Number of Diagnoses or Management Options Diagnosis management comments: Patient with elevated glucose, related to not having her Toujeo insulin. Also has known neuropathy, not taking anything currently and an itching sensation in her chest. Her glucose is mildly elevated today but no signs of DKA.  Her troponin is negative x2.  Her EKG does not show any ischemic changes.  Low concern for acute coronary syndrome, PE, other cardiothoracic emergencies.  She will have a refill of her Toujeo, a trial prescription of Neurontin and advised to follow-up with her primary care  physician.    Amount and/or Complexity of Data Reviewed Clinical lab tests: ordered and reviewed Tests in the radiology section of CPT: ordered and reviewed Review and summarize past medical records: yes Independent visualization of images, tracings, or specimens: yes  Risk of Complications, Morbidity, and/or Mortality Presenting problems: high Diagnostic procedures: high Management options: high    Final Clinical Impression(s) / ED Diagnoses Final diagnoses:  Hyperglycemia  Diabetic polyneuropathy associated with type 2 diabetes mellitus ([place])    Rx / DC Orders ED Discharge Orders         Ordered    NOVOLIN N RELION 100 UNIT/ML injection  2 times daily     10/31/19 2134    gabapentin (NEURONTIN) 300 MG capsule  3 times daily     10/31/19 2134           Truddie Hidden, MD 10/31/19 2135

## 2019-10-31 NOTE — ED Triage Notes (Signed)
Patient states that her BS has been running high and also complains of sharp pain in her legs and feet, went to [facility] ED earlier in week and reports no diagnosis. Complains of CP earlier today. Alert and oriented, NAD

## 2019-10-31 NOTE — ED Notes (Signed)
CBG Results of 297  Reported to [place], Charity fundraiser.

## 2020-06-02 ENCOUNTER — Other Ambulatory Visit: Payer: Self-pay

## 2020-06-02 ENCOUNTER — Emergency Department (HOSPITAL_COMMUNITY)
Admission: EM | Admit: 2020-06-02 | Discharge: 2020-06-02 | Disposition: A | Discharge status: Home / Self Care | Payer: Self-pay | Attending: Emergency Medicine | Admitting: Emergency Medicine

## 2020-06-02 ENCOUNTER — Emergency Department (HOSPITAL_COMMUNITY): Payer: Self-pay

## 2020-06-02 ENCOUNTER — Encounter (HOSPITAL_COMMUNITY): Payer: Self-pay

## 2020-06-02 DIAGNOSIS — R42 Dizziness and giddiness: Secondary | ICD-10-CM | POA: Insufficient documentation

## 2020-06-02 DIAGNOSIS — R110 Nausea: Secondary | ICD-10-CM | POA: Insufficient documentation

## 2020-06-02 DIAGNOSIS — R519 Headache, unspecified: Secondary | ICD-10-CM | POA: Insufficient documentation

## 2020-06-02 DIAGNOSIS — Z7984 Long term (current) use of oral hypoglycemic drugs: Secondary | ICD-10-CM | POA: Insufficient documentation

## 2020-06-02 DIAGNOSIS — R55 Syncope and collapse: Secondary | ICD-10-CM | POA: Insufficient documentation

## 2020-06-02 DIAGNOSIS — E119 Type 2 diabetes mellitus without complications: Secondary | ICD-10-CM | POA: Insufficient documentation

## 2020-06-02 DIAGNOSIS — Z794 Long term (current) use of insulin: Secondary | ICD-10-CM | POA: Insufficient documentation

## 2020-06-02 DIAGNOSIS — J45909 Unspecified asthma, uncomplicated: Secondary | ICD-10-CM | POA: Insufficient documentation

## 2020-06-02 LAB — URINALYSIS, ROUTINE W REFLEX MICROSCOPIC
Bilirubin Urine: NEGATIVE
Glucose, UA: NEGATIVE mg/dL
Hgb urine dipstick: NEGATIVE
Ketones, ur: 5 mg/dL — AB
Leukocytes,Ua: NEGATIVE
Nitrite: NEGATIVE
Protein, ur: NEGATIVE mg/dL
Specific Gravity, Urine: 1.018 (ref 1.005–1.030)
pH: 5 (ref 5.0–8.0)

## 2020-06-02 LAB — I-STAT BETA HCG BLOOD, ED (MC, WL, AP ONLY): I-stat hCG, quantitative: <5 m[IU]/mL (ref <5)

## 2020-06-02 LAB — CBC
HCT: 42.4 % (ref 36.0–46.0)
Hemoglobin: 13.4 g/dL (ref 12.0–15.0)
MCH: 23.6 pg — ABNORMAL LOW (ref 26.0–34.0)
MCHC: 31.6 g/dL (ref 30.0–36.0)
MCV: 74.5 fL — ABNORMAL LOW (ref 80.0–100.0)
Platelets: 327 10*3/uL (ref 150–400)
RBC: 5.69 MIL/uL — ABNORMAL HIGH (ref 3.87–5.11)
RDW: 13.5 % (ref 11.5–15.5)
WBC: 8.4 10*3/uL (ref 4.0–10.5)
nRBC: 0 % (ref 0.0–0.2)

## 2020-06-02 LAB — BASIC METABOLIC PANEL
Anion gap: 7 (ref 5–15)
BUN: 11 mg/dL (ref 6–20)
CO2: 23 mmol/L (ref 22–32)
Calcium: 8.6 mg/dL — ABNORMAL LOW (ref 8.9–10.3)
Chloride: 106 mmol/L (ref 98–111)
Creatinine, Ser: 0.8 mg/dL (ref 0.44–1.00)
GFR, Estimated: >60 mL/min (ref >60)
Glucose, Bld: 153 mg/dL — ABNORMAL HIGH (ref 70–99)
Potassium: 4.1 mmol/L (ref 3.5–5.1)
Sodium: 136 mmol/L (ref 135–145)

## 2020-06-02 MED ORDER — KETOROLAC TROMETHAMINE 30 MG/ML IJ SOLN
30.0000 mg | Freq: Once | INTRAMUSCULAR | Status: AC
  Administered 2020-06-02: 30 mg via INTRAVENOUS
  Filled 2020-06-02: qty 1

## 2020-06-02 MED ORDER — SODIUM CHLORIDE 0.9 % IV BOLUS
500.0000 mL | Freq: Once | INTRAVENOUS | Status: AC
  Administered 2020-06-02: 500 mL via INTRAVENOUS

## 2020-06-02 MED ORDER — METOCLOPRAMIDE HCL 5 MG/ML IJ SOLN
10.0000 mg | Freq: Once | INTRAMUSCULAR | Status: AC
  Administered 2020-06-02: 10 mg via INTRAVENOUS
  Filled 2020-06-02: qty 2

## 2020-06-02 MED ORDER — IOHEXOL 350 MG/ML SOLN
75.0000 mL | Freq: Once | INTRAVENOUS | Status: AC | PRN
  Administered 2020-06-02: 75 mL via INTRAVENOUS

## 2020-06-02 MED ORDER — MAGNESIUM SULFATE IN D5W 1-5 GM/100ML-% IV SOLN
1.0000 g | Freq: Once | INTRAVENOUS | Status: AC
  Administered 2020-06-02: 1 g via INTRAVENOUS
  Filled 2020-06-02: qty 100

## 2020-06-02 MED ORDER — DIPHENHYDRAMINE HCL 50 MG/ML IJ SOLN
25.0000 mg | Freq: Once | INTRAMUSCULAR | Status: AC
  Administered 2020-06-02: 25 mg via INTRAVENOUS
  Filled 2020-06-02: qty 1

## 2020-06-02 NOTE — Discharge Instructions (Addendum)
Please read instructions below. Stay hydrated. Schedule an appointment with neurology to follow up on your headache and discuss preventative treatment. Return to the ER for severely worsening headache, vision changes, fever, weakness or numbness, or new or concerning symptoms.

## 2020-06-02 NOTE — ED Notes (Signed)
Pt in bed with watching TV, husband at bedside. Denies any needs at this time. Will continue to monitor.

## 2020-06-02 NOTE — ED Provider Notes (Cosign Needed Addendum)
[facility]-EMERGENCY DEPT Provider Note   CSN: [id number] Arrival date & time: 06/02/20  1032     History Chief Complaint  Patient presents with  . Near Syncope  . Headache    Victorina Kable is a 39 y.o. female history of asthma, obesity, diabetes.  Patient reports 5 days ago she developed a migraine, she describes a right-sided headache, throbbing constant nonradiating worsened with bright lights and loud sounds minimally improved with ibuprofen, headache was gradual in onset throughout the day.  It has been constant for the last 5 days associated with some nausea.  She reports eating less due to her nausea but has not experienced any vomiting.  She reports that 2 days ago she was lying in bed and got up to walk to the bathroom, she reports right after she stood up she felt lightheaded and use the wall to help herself down to the ground, she believes she may have passed out for a few seconds, this has not reoccurred.  She reports her headache is similar in nature to her previous migraines but this is more severe than normal.  Denies fever/chills, vision changes, neck stiffness, difficulty speaking, confusion, chest pain/shortness of breath, cough, abdominal pain, vomiting, diarrhea, numbness/tingling, weakness or any additional concerns.  HPI     Past Medical History:  Diagnosis Date  . Asthma   . Diabetes mellitus without complication (HCC)     There are no problems to display for this patient.   History reviewed. No pertinent surgical history.   OB History   No obstetric history on file.     History reviewed. No pertinent family history.  Social History   Tobacco Use  . Smoking status: Never Smoker  . Smokeless tobacco: Never Used  Substance Use Topics  . Alcohol use: Yes  . Drug use: Not on file    Home Medications Prior to Admission medications   Medication Sig Start Date End Date Taking? Authorizing Provider  gabapentin (NEURONTIN) 300  MG capsule Take 1 capsule (300 mg total) by mouth 3 (three) times daily. 10/31/19   Pollyann Savoy, MD  glipiZIDE (GLUCOTROL) 5 MG tablet Take 5 mg by mouth 2 (two) times daily before a meal. 08/21/18   [provider]  HYDROcodone-acetaminophen (NORCO/VICODIN) 5-325 MG tablet Take 1-2 tablets by mouth every 4 (four) hours as needed. 10/27/19   Lorre Nick, MD  NOVOLIN N RELION 100 UNIT/ML injection Inject 0.1 mLs (10 Units total) into the skin 2 (two) times daily. 10/31/19 11/30/19  Pollyann Savoy, MD  metoCLOPramide (REGLAN) 10 MG tablet Take 1 tablet (10 mg total) by mouth every 6 (six) hours as needed (Nausea or headache). Patient not taking: Reported on 10/12/2017 02/18/13 10/31/19  Dione Booze, MD    Allergies    Amoxapine and related, Amoxicillin, Tramadol, and Rocephin [ceftriaxone]  Review of Systems   Review of Systems Ten systems are reviewed and are negative for acute change except as noted in the HPI  Physical Exam Updated Vital Signs BP (!) 137/94 (BP Location: Left Arm)   Pulse 75   Temp 98.5 F (36.9 C) (Oral)   Resp (!) 25   Ht 5\' 6"  (1.676 m)   Wt 108.9 kg   LMP 05/05/2020   SpO2 98%   BMI 38.74 kg/m   Physical Exam Constitutional:      General: She is not in acute distress.    Appearance: Normal appearance. She is well-developed. She is not ill-appearing or diaphoretic.  HENT:     Head: Normocephalic and atraumatic.  Eyes:     General: Vision grossly intact. Gaze aligned appropriately.     Pupils: Pupils are equal, round, and reactive to light.  Neck:     Trachea: Trachea and phonation normal.  Pulmonary:     Effort: Pulmonary effort is normal. No respiratory distress.  Abdominal:     General: There is no distension.     Palpations: Abdomen is soft.     Tenderness: There is no abdominal tenderness. There is no guarding or rebound.  Musculoskeletal:        General: Normal range of motion.     Cervical back: Normal range of motion.  Skin:     General: Skin is warm and dry.  Neurological:     Mental Status: She is alert.     GCS: GCS eye subscore is 4. GCS verbal subscore is 5. GCS motor subscore is 6.     Comments: Mental Status: Alert, oriented, thought content appropriate, able to give a coherent history. Speech fluent without evidence of aphasia. Able to follow 2 step commands without difficulty. Cranial Nerves: II: Peripheral visual fields grossly normal, pupils equal, round, reactive to light III,IV, VI: ptosis not present, extra-ocular motions intact bilaterally V,VII: smile symmetric, eyebrows raise symmetric, facial light touch sensation equal VIII: hearing grossly normal to voice X: uvula elevates symmetrically XI: bilateral shoulder shrug symmetric and strong XII: midline tongue extension without fassiculations Motor: Normal tone. 5/5 strength in upper and lower extremities bilaterally including strong and equal grip strength and dorsiflexion/plantar flexion Sensory: Sensation intact to light touch in all extremities.Negative Romberg.  Cerebellar: normal finger-to-nose with bilateral upper extremities. Normal heel-to -shin balance bilaterally of the lower extremity. No pronator drift.  Gait: normal gait and balance CV: distal pulses palpable throughout  Psychiatric:        Behavior: Behavior normal.    ED Results / Procedures / Treatments   Labs (all labs ordered are listed, but only abnormal results are displayed) Labs Reviewed  BASIC METABOLIC PANEL  CBC  URINALYSIS, ROUTINE W REFLEX MICROSCOPIC  CBG MONITORING, ED  I-STAT BETA HCG BLOOD, ED ([facility], [facility], [facility] ONLY)    EKG EKG Interpretation  Date/Time:  Wednesday June 02 2020 10:47:32 EST Ventricular Rate:  73 PR Interval:    QRS Duration: 89 QT Interval:  382 QTC Calculation: 421 R Axis:   -39 Text Interpretation: Sinus rhythm Left axis deviation Borderline T abnormalities, anterior leads Confirmed by Marianna Fuss ([id number]) on 06/02/2020  11:11:31 AM   Radiology No results found.  Procedures Procedures (including critical care time)  Medications Ordered in ED Medications - No data to display  ED Course  I have reviewed the triage vital signs and the nursing notes.  Pertinent labs & imaging results that were available during my care of the patient were reviewed by me and considered in my medical decision making (see chart for details).    MDM Rules/Calculators/A&P                         Additional history obtained from: 1. Nursing notes from this visit. 2. Review of electronic medical records.  Patient was last seen in the ER for a headache in July 2014 at that time she improved with Reglan a CT head was obtained and was negative for acute findings but did show a empty sella. ---- 39 year old female presented for right-sided headache gradual onset 5 days ago,  similar to previous migraines but feels worse.  Associated with some nausea without vomiting has been eating less over the last few days.  She had a one episode of questionable syncope after standing from her bed to go use the bathroom which is not reoccurred.  No associated chest pain or difficulty breathing.  No weakness or neurologic complaint.  On exam she is well-appearing no acute distress cranial nerves intact, no meningeal signs, abdomen soft nontender without peritoneal signs, neurovascular tact all 4 extremities.  Basic labs were obtained in triage including CBC, BMP, beta hCG and urinalysis.  Will give patient migraine cocktail with Benadryl Reglan and IV fluid.  Suspect syncope 2 days ago was orthostatic EKG was obtained, no associated chest pain or shortness of breath to suggest PE or other acute cardiopulmonary etiologies.  Low suspicion for [facility] however given worsening headache and questionable syncope will obtain intracranial imaging.  Discussed case with Dr. Stevie Kern, will obtain CT head with and without contrast. - I ordered, reviewed and interpreted  labs which include: Urinalysis shows 5 ketones otherwise normal limits, suspect from dehydration, no evidence of infection. BMP shows glucose 153, no emergent Electra derangement, AKI or gap normal bicarb doubt DKA. CBC without leukocytosis to suggest bacterial infection, no anemia. Beta hCG negative.  EKG: Sinus rhythm Left axis deviation Borderline T abnormalities, anterior leads Confirmed by Marianna Fuss ([id number]) on 06/02/2020 11:11:31 AM -------------------------- Care handoff given to [place] Robinson, PA-C at shift change, plan of care is to follow-up on CT imaging and reassess. Pending no acute findings and improvement of symptoms anticipate discharge with outpatient follow-up. Final disposition per oncoming team.  Discussed plan with Dr. Stevie Kern who agrees with above.  Note: Portions of this report may have been transcribed using voice recognition software. Every effort was made to ensure accuracy; however, inadvertent computerized transcription errors may still be present. Final Clinical Impression(s) / ED Diagnoses Final diagnoses:  None    Rx / DC Orders ED Discharge Orders    None       Bill Salinas, PA-C 06/02/20 1513    Bill Salinas, PA-C 06/02/20 1519

## 2020-06-02 NOTE — ED Triage Notes (Signed)
C/o headache X5 days and near syncope episode Monday.   C/o Nausea    Ambulatory in triage A/Ox4

## 2020-06-02 NOTE — ED Provider Notes (Signed)
Care assumed at shift change from [place], [place], pending CTA head and re-evaluation. See their note for full HPI and workup. Briefly, pt presenting with chronic headache, today with 5 days right headache with episode of syncope 2 days go, prodrome of lightheadedness. Given IVF, orthostatics, CTA head r/o aneurysm. Anticipate d/c to home if neg workup and symptom improvement. Neuro referral.  Physical Exam  BP 125/83   Pulse 71   Temp 98.5 F (36.9 C) (Oral)   Resp 19   Ht 5\' 6"  (1.676 m)   Wt 108.9 kg   LMP 05/05/2020   SpO2 99%   BMI 38.74 kg/m   Physical Exam Vitals and nursing note reviewed.  Constitutional:      General: She is not in acute distress.    Appearance: She is well-developed. She is not ill-appearing.  HENT:     Head: Normocephalic and atraumatic.  Eyes:     Conjunctiva/sclera: Conjunctivae normal.  Cardiovascular:     Rate and Rhythm: Normal rate.  Pulmonary:     Effort: Pulmonary effort is normal.  Abdominal:     Palpations: Abdomen is soft.  Skin:    General: Skin is warm.  Neurological:     Mental Status: She is alert.  Psychiatric:        Behavior: Behavior normal.     ED Course/Procedures   Clinical Course as of Jun 02 1820  Wed Jun 02, 2020  1811 Patient reports improvement in symptoms after additional medications and states she is ready for discharge.   [JR]    Clinical Course User Index [JR] Tiwana Chavis, [address], PA-C    Procedures Results for orders placed or performed during the hospital encounter of 06/02/20  Basic metabolic panel  Result Value Ref Range   Sodium 136 135 - 145 mmol/L   Potassium 4.1 3.5 - 5.1 mmol/L   Chloride 106 98 - 111 mmol/L   CO2 23 22 - 32 mmol/L   Glucose, Bld 153 (H) 70 - 99 mg/dL   BUN 11 6 - 20 mg/dL   Creatinine, Ser 13/10/21 0.44 - 1.00 mg/dL   Calcium 8.6 (L) 8.9 - 10.3 mg/dL   GFR, Estimated 1.76 >16 mL/min   Anion gap 7 5 - 15  CBC  Result Value Ref Range   WBC 8.4 4.0 - 10.5 K/uL   RBC 5.69 (H)  3.87 - 5.11 MIL/uL   Hemoglobin 13.4 12.0 - 15.0 g/dL   HCT >07 36 - 46 %   MCV 74.5 (L) 80.0 - 100.0 fL   MCH 23.6 (L) 26.0 - 34.0 pg   MCHC 31.6 30.0 - 36.0 g/dL   RDW 37.1 06.2 - 69.4 %   Platelets 327 150 - 400 K/uL   nRBC 0.0 0.0 - 0.2 %  Urinalysis, Routine w reflex microscopic  Result Value Ref Range   Color, Urine YELLOW YELLOW   APPearance CLEAR CLEAR   Specific Gravity, Urine 1.018 1.005 - 1.030   pH 5.0 5.0 - 8.0   Glucose, UA NEGATIVE NEGATIVE mg/dL   Hgb urine dipstick NEGATIVE NEGATIVE   Bilirubin Urine NEGATIVE NEGATIVE   Ketones, ur 5 (A) NEGATIVE mg/dL   Protein, ur NEGATIVE NEGATIVE mg/dL   Nitrite NEGATIVE NEGATIVE   Leukocytes,Ua NEGATIVE NEGATIVE  I-Stat beta hCG blood, ED  Result Value Ref Range   I-stat hCG, quantitative <5.0 <5 mIU/mL   Comment 3           CT Angio Head W or Wo Contrast  Result Date: 06/02/2020 CLINICAL DATA:  Dizziness, nonspecific; headache for 5 days; gradual onset similar to prior migraine; 2 days ago questionable syncope after standing to go to bathroom. EXAM: CT ANGIOGRAPHY HEAD TECHNIQUE: Multidetector CT imaging of the head was performed using the standard protocol during bolus administration of intravenous contrast. Multiplanar CT image reconstructions and MIPs were obtained to evaluate the vascular anatomy. CONTRAST:  78mL OMNIPAQUE IOHEXOL 350 MG/ML SOLN COMPARISON:  Head CT 01/29/2016. FINDINGS: CT HEAD Brain: Cerebral volume is normal for age. There is no acute intracranial hemorrhage. No demarcated cortical infarct. No extra-axial fluid collection. No evidence of intracranial mass. No midline shift. Partially empty sella turcica Vascular: No hyperdense vessel.  Atherosclerotic calcifications Skull: Normal. Negative for fracture or focal lesion. Sinuses: Mild mucosal thickening within the bilateral maxillary sinuses. Orbits: No mass or acute finding. CTA HEAD Anterior circulation: The intracranial internal carotid arteries are  patent. The M1 middle cerebral arteries are patent without significant stenosis. No M2 proximal branch occlusion or high-grade proximal stenosis is identified. The anterior cerebral arteries are patent. No intracranial aneurysm is identified. Posterior circulation: The intracranial vertebral arteries are patent. The basilar artery is patent. The posterior cerebral arteries are patent. A right posterior communicating artery is present. The left posterior communicating artery is hypoplastic or absent. Venous sinuses: Within limitations of contrast timing, no convincing thrombus. Anatomic variants: As described IMPRESSION: CT head: 1. No evidence of acute intracranial abnormality. 2. Partially empty sella turcica. This finding is very commonly incidental, but can be associated with idiopathic intracranial hypertension. 3. Mild bilateral maxillary sinus mucosal thickening. CTA head: No intracranial large vessel occlusion or proximal high-grade arterial stenosis. Electronically Signed   By: Jackey Loge DO   On: 06/02/2020 15:49    MDM  CT imaging is negative for aneurysm.  Discussed incidental findings, low suspicion for idiopathic intracranial hypertension given absence of vision changes.  Patient states initially headache improved however is returning.  She is overall well-appearing and in no distress.  Will give additional dose of medications with anticipated discharge with neurology referral for follow-up.  Patient verbalized understanding and agrees with care plan.       Cayde Held, [place] N, PA-C 06/02/20 Estill Cotta, MD 06/03/20 [phone]

## 2020-06-04 ENCOUNTER — Other Ambulatory Visit: Payer: Self-pay

## 2020-06-04 ENCOUNTER — Encounter (HOSPITAL_COMMUNITY): Payer: Self-pay

## 2020-06-04 ENCOUNTER — Emergency Department (HOSPITAL_COMMUNITY)
Admission: EM | Admit: 2020-06-04 | Discharge: 2020-06-04 | Disposition: A | Discharge status: Home / Self Care | Payer: Medicaid Other | Attending: Emergency Medicine | Admitting: Emergency Medicine

## 2020-06-04 DIAGNOSIS — Z7984 Long term (current) use of oral hypoglycemic drugs: Secondary | ICD-10-CM | POA: Insufficient documentation

## 2020-06-04 DIAGNOSIS — Z794 Long term (current) use of insulin: Secondary | ICD-10-CM | POA: Insufficient documentation

## 2020-06-04 DIAGNOSIS — R519 Headache, unspecified: Secondary | ICD-10-CM | POA: Insufficient documentation

## 2020-06-04 DIAGNOSIS — H5319 Other subjective visual disturbances: Secondary | ICD-10-CM | POA: Insufficient documentation

## 2020-06-04 DIAGNOSIS — G43809 Other migraine, not intractable, without status migrainosus: Secondary | ICD-10-CM

## 2020-06-04 DIAGNOSIS — J45909 Unspecified asthma, uncomplicated: Secondary | ICD-10-CM | POA: Insufficient documentation

## 2020-06-04 DIAGNOSIS — E119 Type 2 diabetes mellitus without complications: Secondary | ICD-10-CM | POA: Insufficient documentation

## 2020-06-04 LAB — CBC WITH DIFFERENTIAL/PLATELET
Abs Immature Granulocytes: 0.02 10*3/uL (ref 0.00–0.07)
Basophils Absolute: 0.1 10*3/uL (ref 0.0–0.1)
Basophils Relative: 1 %
Eosinophils Absolute: 0.2 10*3/uL (ref 0.0–0.5)
Eosinophils Relative: 2 %
HCT: 47.4 % — ABNORMAL HIGH (ref 36.0–46.0)
Hemoglobin: 14.6 g/dL (ref 12.0–15.0)
Immature Granulocytes: 0 %
Lymphocytes Relative: 49 %
Lymphs Abs: 4.9 10*3/uL — ABNORMAL HIGH (ref 0.7–4.0)
MCH: 23.2 pg — ABNORMAL LOW (ref 26.0–34.0)
MCHC: 30.8 g/dL (ref 30.0–36.0)
MCV: 75.4 fL — ABNORMAL LOW (ref 80.0–100.0)
Monocytes Absolute: 0.5 10*3/uL (ref 0.1–1.0)
Monocytes Relative: 5 %
Neutro Abs: 4.3 10*3/uL (ref 1.7–7.7)
Neutrophils Relative %: 43 %
Platelets: 364 10*3/uL (ref 150–400)
RBC: 6.29 MIL/uL — ABNORMAL HIGH (ref 3.87–5.11)
RDW: 13.6 % (ref 11.5–15.5)
WBC: 9.9 10*3/uL (ref 4.0–10.5)
nRBC: 0 % (ref 0.0–0.2)

## 2020-06-04 LAB — BASIC METABOLIC PANEL
Anion gap: 9 (ref 5–15)
BUN: 9 mg/dL (ref 6–20)
CO2: 22 mmol/L (ref 22–32)
Calcium: 9 mg/dL (ref 8.9–10.3)
Chloride: 102 mmol/L (ref 98–111)
Creatinine, Ser: 0.93 mg/dL (ref 0.44–1.00)
GFR, Estimated: >60 mL/min (ref >60)
Glucose, Bld: 91 mg/dL (ref 70–99)
Potassium: 3.8 mmol/L (ref 3.5–5.1)
Sodium: 133 mmol/L — ABNORMAL LOW (ref 135–145)

## 2020-06-04 LAB — SEDIMENTATION RATE: Sed Rate: 10 mm/hr (ref 0–22)

## 2020-06-04 MED ORDER — DEXAMETHASONE SODIUM PHOSPHATE 10 MG/ML IJ SOLN
10.0000 mg | Freq: Once | INTRAMUSCULAR | Status: AC
  Administered 2020-06-04: 10 mg via INTRAVENOUS
  Filled 2020-06-04: qty 1

## 2020-06-04 MED ORDER — KETOROLAC TROMETHAMINE 30 MG/ML IJ SOLN
30.0000 mg | Freq: Once | INTRAMUSCULAR | Status: AC
  Administered 2020-06-04: 30 mg via INTRAVENOUS
  Filled 2020-06-04: qty 1

## 2020-06-04 MED ORDER — SODIUM CHLORIDE 0.9 % IV BOLUS
1000.0000 mL | Freq: Once | INTRAVENOUS | Status: AC
  Administered 2020-06-04: 1000 mL via INTRAVENOUS

## 2020-06-04 MED ORDER — PROMETHAZINE HCL 25 MG PO TABS: 25.0000 mg | ORAL_TABLET | Freq: Four times a day (QID) | ORAL | 0 refills | Status: DC | PRN

## 2020-06-04 MED ORDER — PROMETHAZINE HCL 25 MG/ML IJ SOLN
25.0000 mg | Freq: Once | INTRAMUSCULAR | Status: AC
  Administered 2020-06-04: 25 mg via INTRAVENOUS
  Filled 2020-06-04: qty 1

## 2020-06-04 NOTE — ED Triage Notes (Signed)
Pt presents with right frontal  headache x8 days. Pt seen at [facility] ED yesterday for the same. Ct scan was negative. Pt reports they dx her with high blood pressure

## 2020-06-04 NOTE — Discharge Instructions (Signed)
Take Phenergan as needed for pain headaches.  Can also take Tylenol or Motrin for headaches as well.  Please follow-up with neurology   Return to ER if you have worse headaches, blurry vision, trouble speaking, vomiting, weakness, numbness

## 2020-06-04 NOTE — ED Provider Notes (Addendum)
[place] EMERGENCY DEPARTMENT Provider Note   CSN: [id number] Arrival date & time: 06/04/20  1300     History Chief Complaint  Patient presents with  . Headache    Shannon Oneal is a 39 y.o. female diabetes, asthma here presenting with headache.  Patient went to [facility] 3 days ago for headache.  Patient states that the headache is in the right temple area.  Had a CTA did not show any dissection or aneurysm.  Patient had labs that were unremarkable.  Patient was given Reglan and Benadryl and Toradol and she states that she had minimal relief.  She states that the headache has not gone away and gradually got worse.  She denies any sudden onset of severe headache.  Denies any vomiting.  She does have some photophobia.  Denies any neck pain or fever.  The history is provided by the patient.       Past Medical History:  Diagnosis Date  . Asthma   . Diabetes mellitus without complication ([place])     There are no problems to display for this patient.   History reviewed. No pertinent surgical history.   OB History   No obstetric history on file.     History reviewed. No pertinent family history.  Social History   Tobacco Use  . Smoking status: Never Smoker  . Smokeless tobacco: Never Used  Substance Use Topics  . Alcohol use: Yes  . Drug use: Not on file    Home Medications Prior to Admission medications   Medication Sig Start Date End Date Taking? Authorizing Provider  DULoxetine (CYMBALTA) 30 MG capsule Take 30 mg by mouth daily. 01/18/20   [provider]  gabapentin (NEURONTIN) 300 MG capsule Take 1 capsule (300 mg total) by mouth 3 (three) times daily. Patient not taking: Reported on 06/02/2020 10/31/19   Truddie Hidden, MD  glipiZIDE (GLUCOTROL) 10 MG tablet Take 10 mg by mouth daily before breakfast.    [provider]  HYDROcodone-acetaminophen (NORCO/VICODIN) 5-325 MG tablet Take 1-2 tablets by mouth every 4  (four) hours as needed. Patient not taking: Reported on 06/02/2020 10/27/19   Lacretia Leigh, MD  ibuprofen (ADVIL) 200 MG tablet Take 400 mg by mouth 2 (two) times daily as needed for moderate pain.    [provider]  metFORMIN (GLUCOPHAGE) 1000 MG tablet Take 1,000 mg by mouth 2 (two) times daily. 01/18/20   [provider]  NOVOLIN N RELION 100 UNIT/ML injection Inject 0.1 mLs (10 Units total) into the skin 2 (two) times daily. Patient not taking: Reported on 06/02/2020 10/31/19 11/30/19  Truddie Hidden, MD  TOUJEO SOLOSTAR 300 UNIT/ML Solostar Pen Inject 20 Units into the skin in the morning and at bedtime.  01/18/20   [provider]  metoCLOPramide (REGLAN) 10 MG tablet Take 1 tablet (10 mg total) by mouth every 6 (six) hours as needed (Nausea or headache). Patient not taking: Reported on 10/12/2017 3/97/67 09/24/17  Delora Fuel, MD    Allergies    Amoxapine and related, Amoxicillin, Tramadol, and Rocephin [ceftriaxone]  Review of Systems   Review of Systems  Neurological: Positive for headaches.  All other systems reviewed and are negative.   Physical Exam Updated Vital Signs BP 125/83 (BP Location: Left Arm)   Pulse 76   Temp 98.5 F (36.9 C) (Oral)   Resp 18   Ht 5' 6" (1.676 m)   Wt 108.9 kg   LMP 05/05/2020  SpO2 98%   BMI 38.74 kg/m   Physical Exam Vitals and nursing note reviewed.  HENT:     Head: Normocephalic.     Comments: Tenderness in the right temporal area.     Mouth/Throat:     Mouth: Mucous membranes are moist.  Eyes:     Extraocular Movements: Extraocular movements intact.  Cardiovascular:     Rate and Rhythm: Normal rate and regular rhythm.     Heart sounds: Normal heart sounds.  Pulmonary:     Effort: Pulmonary effort is normal.     Breath sounds: Normal breath sounds.  Abdominal:     General: Bowel sounds are normal.     Palpations: Abdomen is soft.  Musculoskeletal:        General: Normal range of motion.      Cervical back: Normal range of motion and neck supple.  Skin:    General: Skin is warm.  Neurological:     Mental Status: She is alert and oriented to person, place, and time.     Cranial Nerves: No cranial nerve deficit.     Sensory: No sensory deficit.     Motor: No weakness.  Psychiatric:        Mood and Affect: Mood normal.        Behavior: Behavior normal.     ED Results / Procedures / Treatments   Labs (all labs ordered are listed, but only abnormal results are displayed) Labs Reviewed  CBC WITH DIFFERENTIAL/PLATELET  BASIC METABOLIC PANEL  SEDIMENTATION RATE    EKG None  Radiology No results found.  Procedures Procedures (including critical care time)  Medications Ordered in ED Medications  promethazine (PHENERGAN) injection 25 mg (has no administration in time range)  ketorolac (TORADOL) 30 MG/ML injection 30 mg (has no administration in time range)  sodium chloride 0.9 % bolus 1,000 mL (has no administration in time range)  dexamethasone (DECADRON) injection 10 mg (has no administration in time range)    ED Course  I have reviewed the triage vital signs and the nursing notes.  Pertinent labs & imaging results that were available during my care of the patient were reviewed by me and considered in my medical decision making (see chart for details).    MDM Rules/Calculators/A&P                         Shannon Oneal is a 39 y.o. female here with headache. Has mild R temporal area tenderness.  No blurry vision.  Low suspicion for temporal arteritis.  Given persistent symptoms we will get an ESR now.  We will try another round of migraine cocktail.  Will not repeat CT or get MRI.  9:34 PM ESR is normal.  Patient felt much better after steroids and Phenergan.  Will discharge home with phenergan prn. Encouraged her to see neurology    Final Clinical Impression(s) / ED Diagnoses Final diagnoses:  None    Rx / DC Orders ED Discharge Orders    None         Yao, David Hsienta, MD 06/04/20 2134    Yao, David Hsienta, MD 06/04/20 2135  

## 2021-01-18 ENCOUNTER — Emergency Department (HOSPITAL_COMMUNITY): Payer: BC Managed Care – PPO

## 2021-01-18 ENCOUNTER — Encounter (HOSPITAL_COMMUNITY): Payer: Self-pay | Admitting: Emergency Medicine

## 2021-01-18 ENCOUNTER — Inpatient Hospital Stay (HOSPITAL_COMMUNITY)
Admission: EM | Admit: 2021-01-18 | Discharge: 2021-01-22 | DRG: 176 | Disposition: A | Discharge status: Home / Self Care | Payer: BC Managed Care – PPO | Attending: Internal Medicine | Admitting: Internal Medicine

## 2021-01-18 ENCOUNTER — Other Ambulatory Visit: Payer: Self-pay

## 2021-01-18 ENCOUNTER — Emergency Department (HOSPITAL_BASED_OUTPATIENT_CLINIC_OR_DEPARTMENT_OTHER): Payer: BC Managed Care – PPO

## 2021-01-18 DIAGNOSIS — I82411 Acute embolism and thrombosis of right femoral vein: Secondary | ICD-10-CM | POA: Diagnosis not present

## 2021-01-18 DIAGNOSIS — Z7984 Long term (current) use of oral hypoglycemic drugs: Secondary | ICD-10-CM

## 2021-01-18 DIAGNOSIS — M79604 Pain in right leg: Secondary | ICD-10-CM | POA: Diagnosis not present

## 2021-01-18 DIAGNOSIS — J45909 Unspecified asthma, uncomplicated: Secondary | ICD-10-CM | POA: Diagnosis not present

## 2021-01-18 DIAGNOSIS — Z79899 Other long term (current) drug therapy: Secondary | ICD-10-CM | POA: Diagnosis not present

## 2021-01-18 DIAGNOSIS — I824Y1 Acute embolism and thrombosis of unspecified deep veins of right proximal lower extremity: Secondary | ICD-10-CM

## 2021-01-18 DIAGNOSIS — M1712 Unilateral primary osteoarthritis, left knee: Secondary | ICD-10-CM | POA: Diagnosis not present

## 2021-01-18 DIAGNOSIS — E1165 Type 2 diabetes mellitus with hyperglycemia: Secondary | ICD-10-CM | POA: Diagnosis present

## 2021-01-18 DIAGNOSIS — I82441 Acute embolism and thrombosis of right tibial vein: Secondary | ICD-10-CM | POA: Diagnosis not present

## 2021-01-18 DIAGNOSIS — Z794 Long term (current) use of insulin: Secondary | ICD-10-CM | POA: Diagnosis not present

## 2021-01-18 DIAGNOSIS — I82461 Acute embolism and thrombosis of right calf muscular vein: Secondary | ICD-10-CM | POA: Diagnosis not present

## 2021-01-18 DIAGNOSIS — Z86711 Personal history of pulmonary embolism: Secondary | ICD-10-CM | POA: Diagnosis not present

## 2021-01-18 DIAGNOSIS — Z20822 Contact with and (suspected) exposure to covid-19: Secondary | ICD-10-CM | POA: Diagnosis present

## 2021-01-18 DIAGNOSIS — I82431 Acute embolism and thrombosis of right popliteal vein: Secondary | ICD-10-CM | POA: Diagnosis present

## 2021-01-18 DIAGNOSIS — I82401 Acute embolism and thrombosis of unspecified deep veins of right lower extremity: Secondary | ICD-10-CM | POA: Diagnosis present

## 2021-01-18 DIAGNOSIS — R202 Paresthesia of skin: Secondary | ICD-10-CM | POA: Diagnosis not present

## 2021-01-18 DIAGNOSIS — I2602 Saddle embolus of pulmonary artery with acute cor pulmonale: Secondary | ICD-10-CM | POA: Diagnosis not present

## 2021-01-18 DIAGNOSIS — I82409 Acute embolism and thrombosis of unspecified deep veins of unspecified lower extremity: Secondary | ICD-10-CM | POA: Diagnosis not present

## 2021-01-18 DIAGNOSIS — I2699 Other pulmonary embolism without acute cor pulmonale: Secondary | ICD-10-CM | POA: Diagnosis not present

## 2021-01-18 DIAGNOSIS — I2692 Saddle embolus of pulmonary artery without acute cor pulmonale: Secondary | ICD-10-CM | POA: Diagnosis not present

## 2021-01-18 DIAGNOSIS — R0602 Shortness of breath: Secondary | ICD-10-CM

## 2021-01-18 DIAGNOSIS — M1711 Unilateral primary osteoarthritis, right knee: Secondary | ICD-10-CM | POA: Diagnosis present

## 2021-01-18 DIAGNOSIS — Z86718 Personal history of other venous thrombosis and embolism: Secondary | ICD-10-CM | POA: Diagnosis not present

## 2021-01-18 DIAGNOSIS — R000 Tachycardia, unspecified: Secondary | ICD-10-CM | POA: Diagnosis not present

## 2021-01-18 HISTORY — DX: Acute embolism and thrombosis of unspecified deep veins of right lower extremity: I82.401

## 2021-01-18 LAB — BASIC METABOLIC PANEL
Anion gap: 6 (ref 5–15)
BUN: 11 mg/dL (ref 6–20)
CO2: 25 mmol/L (ref 22–32)
Calcium: 9.2 mg/dL (ref 8.9–10.3)
Chloride: 102 mmol/L (ref 98–111)
Creatinine, Ser: 1.02 mg/dL — ABNORMAL HIGH (ref 0.44–1.00)
GFR, Estimated: >60 mL/min (ref >60)
Glucose, Bld: 358 mg/dL — ABNORMAL HIGH (ref 70–99)
Potassium: 4.2 mmol/L (ref 3.5–5.1)
Sodium: 133 mmol/L — ABNORMAL LOW (ref 135–145)

## 2021-01-18 LAB — CBC WITH DIFFERENTIAL/PLATELET
Abs Immature Granulocytes: 0.03 K/uL (ref 0.00–0.07)
Basophils Absolute: 0.1 K/uL (ref 0.0–0.1)
Basophils Relative: 1 %
Eosinophils Absolute: 0.1 K/uL (ref 0.0–0.5)
Eosinophils Relative: 2 %
HCT: 47.9 % — ABNORMAL HIGH (ref 36.0–46.0)
Hemoglobin: 15.3 g/dL — ABNORMAL HIGH (ref 12.0–15.0)
Immature Granulocytes: 0 %
Lymphocytes Relative: 43 %
Lymphs Abs: 3.9 K/uL (ref 0.7–4.0)
MCH: 23.7 pg — ABNORMAL LOW (ref 26.0–34.0)
MCHC: 31.9 g/dL (ref 30.0–36.0)
MCV: 74.3 fL — ABNORMAL LOW (ref 80.0–100.0)
Monocytes Absolute: 0.5 K/uL (ref 0.1–1.0)
Monocytes Relative: 6 %
Neutro Abs: 4.3 K/uL (ref 1.7–7.7)
Neutrophils Relative %: 48 %
Platelets: 241 K/uL (ref 150–400)
RBC: 6.45 MIL/uL — ABNORMAL HIGH (ref 3.87–5.11)
RDW: 14.6 % (ref 11.5–15.5)
WBC: 8.9 K/uL (ref 4.0–10.5)
nRBC: 0 % (ref 0.0–0.2)

## 2021-01-18 LAB — PROTIME-INR
INR: 1.1 (ref 0.8–1.2)
Prothrombin Time: 13.7 seconds (ref 11.4–15.2)

## 2021-01-18 LAB — APTT: aPTT: <20 seconds — ABNORMAL LOW (ref 24–36)

## 2021-01-18 MED ORDER — IOHEXOL 350 MG/ML SOLN
100.0000 mL | Freq: Once | INTRAVENOUS | Status: AC | PRN
  Administered 2021-01-18: 21:00:00 100 mL via INTRAVENOUS

## 2021-01-18 MED ORDER — OXYCODONE HCL 5 MG PO TABS
5.0000 mg | ORAL_TABLET | ORAL | Status: DC | PRN
  Administered 2021-01-19 – 2021-01-20 (×4): 5 mg via ORAL
  Filled 2021-01-18 (×4): qty 1

## 2021-01-18 MED ORDER — INSULIN ASPART 100 UNIT/ML IJ SOLN
0.0000 [IU] | Freq: Every day | INTRAMUSCULAR | Status: DC
Start: 2021-01-18 — End: 2021-01-22
  Administered 2021-01-19 (×2): 3 [IU] via SUBCUTANEOUS
  Administered 2021-01-21: 2 [IU] via SUBCUTANEOUS
  Filled 2021-01-18: qty 0.05

## 2021-01-18 MED ORDER — SENNOSIDES-DOCUSATE SODIUM 8.6-50 MG PO TABS: 1.0000 | ORAL_TABLET | Freq: Every evening | ORAL | Status: DC | PRN

## 2021-01-18 MED ORDER — HEPARIN (PORCINE) 25000 UT/250ML-% IV SOLN
1600.0000 [IU]/h | INTRAVENOUS | Status: DC
  Administered 2021-01-18: 1400 [IU]/h via INTRAVENOUS
  Administered 2021-01-19: 1600 [IU]/h via INTRAVENOUS
  Filled 2021-01-18 (×2): qty 250

## 2021-01-18 MED ORDER — INSULIN ASPART 100 UNIT/ML IJ SOLN
0.0000 [IU] | Freq: Three times a day (TID) | INTRAMUSCULAR | Status: DC
Start: 2021-01-19 — End: 2021-01-22
  Administered 2021-01-19: 2 [IU] via SUBCUTANEOUS
  Administered 2021-01-19 (×2): 5 [IU] via SUBCUTANEOUS
  Administered 2021-01-20 – 2021-01-21 (×5): 2 [IU] via SUBCUTANEOUS
  Administered 2021-01-21 – 2021-01-22 (×2): 3 [IU] via SUBCUTANEOUS
  Filled 2021-01-18: qty 0.09

## 2021-01-18 MED ORDER — ACETAMINOPHEN 325 MG PO TABS
650.0000 mg | ORAL_TABLET | Freq: Four times a day (QID) | ORAL | Status: DC | PRN
  Administered 2021-01-21: 650 mg via ORAL
  Filled 2021-01-18 (×2): qty 2

## 2021-01-18 MED ORDER — MORPHINE SULFATE (PF) 4 MG/ML IV SOLN
4.0000 mg | Freq: Once | INTRAVENOUS | Status: AC
  Administered 2021-01-18: 22:00:00 4 mg via INTRAVENOUS
  Filled 2021-01-18: qty 1

## 2021-01-18 MED ORDER — SODIUM CHLORIDE (PF) 0.9 % IJ SOLN
INTRAMUSCULAR | Status: AC
  Filled 2021-01-18: qty 50

## 2021-01-18 MED ORDER — ONDANSETRON HCL 4 MG/2ML IJ SOLN: 4.0000 mg | Freq: Four times a day (QID) | INTRAMUSCULAR | Status: DC | PRN

## 2021-01-18 MED ORDER — SODIUM CHLORIDE 0.9% FLUSH
3.0000 mL | Freq: Two times a day (BID) | INTRAVENOUS | Status: DC
  Administered 2021-01-19 – 2021-01-22 (×7): 3 mL via INTRAVENOUS

## 2021-01-18 MED ORDER — ONDANSETRON HCL 4 MG PO TABS: 4.0000 mg | ORAL_TABLET | Freq: Four times a day (QID) | ORAL | Status: DC | PRN

## 2021-01-18 MED ORDER — ACETAMINOPHEN 650 MG RE SUPP: 650.0000 mg | Freq: Four times a day (QID) | RECTAL | Status: DC | PRN

## 2021-01-18 MED ORDER — INSULIN GLARGINE 100 UNIT/ML ~~LOC~~ SOLN
30.0000 [IU] | Freq: Two times a day (BID) | SUBCUTANEOUS | Status: DC
  Administered 2021-01-19 – 2021-01-22 (×8): 30 [IU] via SUBCUTANEOUS
  Filled 2021-01-18 (×8): qty 0.3

## 2021-01-18 MED ORDER — HEPARIN BOLUS VIA INFUSION
4000.0000 [IU] | Freq: Once | INTRAVENOUS | Status: AC
  Administered 2021-01-18: 4000 [IU] via INTRAVENOUS
  Filled 2021-01-18: qty 4000

## 2021-01-18 MED ORDER — OXYCODONE-ACETAMINOPHEN 5-325 MG PO TABS
1.0000 | ORAL_TABLET | Freq: Once | ORAL | Status: AC
  Administered 2021-01-18: 18:00:00 1 via ORAL
  Filled 2021-01-18: qty 1

## 2021-01-18 NOTE — Progress Notes (Signed)
BLE venous duplex has been completed.  Preliminary findings given to Dr. Rodena Medin.   Results can be found under chart review under CV PROC. 01/18/2021 7:54 PM [name] RVT, RDMS

## 2021-01-18 NOTE — ED Provider Notes (Signed)
[facility]-EMERGENCY DEPT Provider Note   CSN: [id number] Arrival date & time: 01/18/21  1649     History Chief Complaint  Patient presents with   Leg Pain    Shannon Oneal is a 40 y.o. female.  40 year old female with prior medical history as detailed below presents for evaluation.  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  She is not currently on anticoagulation.  She complains of increased right leg pain over the course of today.  She is concerned about possible recurrent DVT.  She also complains of intermittent feelings of sharp chest discomfort over the last 2 weeks.  She denies current chest pain.  She denies shortness of breath.  She denies fever.  She denies cough.  The history is provided by the patient and medical records.  Illness Location:  Right leg pain -concern for possible DVT Severity:  Mild Onset quality:  Gradual Duration:  1 day Timing:  Constant Progression:  Unchanged Chronicity:  New     Past Medical History:  Diagnosis Date   Asthma    Diabetes mellitus without complication (HCC)     There are no problems to display for this patient.   History reviewed. No pertinent surgical history.   OB History   No obstetric history on file.     History reviewed. No pertinent family history.  Social History   Tobacco Use   Smoking status: Never   Smokeless tobacco: Never  Substance Use Topics   Alcohol use: Yes    Home Medications Prior to Admission medications   Medication Sig Start Date End Date Taking? Authorizing Provider  DULoxetine (CYMBALTA) 30 MG capsule Take 30 mg by mouth daily. 01/18/20   [provider]  gabapentin (NEURONTIN) 300 MG capsule Take 1 capsule (300 mg total) by mouth 3 (three) times daily. Patient not taking: Reported on 06/02/2020 10/31/19   Pollyann Savoy, MD  glipiZIDE (GLUCOTROL) 10 MG tablet Take 10 mg by mouth daily before breakfast.    [provider]   HYDROcodone-acetaminophen (NORCO/VICODIN) 5-325 MG tablet Take 1-2 tablets by mouth every 4 (four) hours as needed. Patient not taking: Reported on 06/02/2020 10/27/19   Lorre Nick, MD  ibuprofen (ADVIL) 200 MG tablet Take 400 mg by mouth 2 (two) times daily as needed for moderate pain.    [provider]  metFORMIN (GLUCOPHAGE) 1000 MG tablet Take 1,000 mg by mouth 2 (two) times daily. 01/18/20   [provider]  NOVOLIN N RELION 100 UNIT/ML injection Inject 0.1 mLs (10 Units total) into the skin 2 (two) times daily. Patient not taking: Reported on 06/02/2020 10/31/19 11/30/19  Pollyann Savoy, MD  promethazine (PHENERGAN) 25 MG tablet Take 1 tablet (25 mg total) by mouth every 6 (six) hours as needed for nausea or vomiting (headache). 06/04/20   Charlynne Pander, MD  TOUJEO SOLOSTAR 300 UNIT/ML Solostar Pen Inject 20 Units into the skin in the morning and at bedtime.  01/18/20   [provider]  metoCLOPramide (REGLAN) 10 MG tablet Take 1 tablet (10 mg total) by mouth every 6 (six) hours as needed (Nausea or headache). Patient not taking: Reported on 10/12/2017 02/18/13 10/31/19  Dione Booze, MD    Allergies    Amoxapine and related, Amoxicillin, Tramadol, and Rocephin [ceftriaxone]  Review of Systems   Review of Systems  All other systems reviewed and are negative.  Physical Exam Updated Vital Signs BP (!) 121/94   Pulse 87   Temp 98  F (36.7 C) (Oral)   Resp 16   Ht 5\' 5"  (1.651 m)   Wt 108.4 kg   LMP 12/11/2020 (Exact Date)   SpO2 98%   BMI 39.77 kg/m   Physical Exam Vitals and nursing note reviewed.  Constitutional:      General: She is not in acute distress.    Appearance: Normal appearance. She is well-developed.  HENT:     Head: Normocephalic and atraumatic.  Eyes:     Conjunctiva/sclera: Conjunctivae normal.     Pupils: Pupils are equal, round, and reactive to light.  Cardiovascular:     Rate and Rhythm: Normal rate and regular rhythm.      Heart sounds: Normal heart sounds.  Pulmonary:     Effort: Pulmonary effort is normal. No respiratory distress.     Breath sounds: Normal breath sounds.  Abdominal:     General: There is no distension.     Palpations: Abdomen is soft.     Tenderness: There is no abdominal tenderness.  Musculoskeletal:        General: No deformity. Normal range of motion.     Cervical back: Normal range of motion and neck supple.     Right lower leg: Edema present.  Skin:    General: Skin is warm and dry.  Neurological:     General: No focal deficit present.     Mental Status: She is alert and oriented to person, place, and time.    ED Results / Procedures / Treatments   Labs (all labs ordered are listed, but only abnormal results are displayed) Labs Reviewed  BASIC METABOLIC PANEL - Abnormal; Notable for the following components:      Result Value   Sodium 133 (*)    Glucose, Bld 358 (*)    Creatinine, Ser 1.02 (*)    All other components within normal limits  CBC WITH DIFFERENTIAL/PLATELET - Abnormal; Notable for the following components:   RBC 6.45 (*)    Hemoglobin 15.3 (*)    HCT 47.9 (*)    MCV 74.3 (*)    MCH 23.7 (*)    All other components within normal limits  RESP PANEL BY RT-PCR (FLU A&B, COVID) ARPGX2  PROTIME-INR  APTT  CBC    EKG None  Radiology CT Angio Chest PE W and/or Wo Contrast  Result Date: 01/18/2021 CLINICAL DATA:  PE suspected, low/intermediate prob, positive D-dimer dvt positive EXAM: CT ANGIOGRAPHY CHEST WITH CONTRAST TECHNIQUE: Multidetector CT imaging of the chest was performed using the standard protocol during bolus administration of intravenous contrast. Multiplanar CT image reconstructions and MIPs were obtained to evaluate the vascular anatomy. CONTRAST:  01/20/2021 OMNIPAQUE IOHEXOL 350 MG/ML SOLN COMPARISON:  None. FINDINGS: Cardiovascular: There are bilateral central pulmonary emboli in the distal right and left branch pulmonary arteries. Emboli  extend into into the lobar, segmental, and subsegmental branches of the lower lobes bilaterally. There is similar extension in the the upper lobes but to a lesser degree than the lower lobes. RV/LV ratio is approximately 1.02. Mediastinum/Nodes: No enlarged mediastinal, hilar, or axillary lymph nodes. Thyroid gland, trachea, and esophagus demonstrate no significant findings. Lungs/Pleura: Lungs are clear. No pleural effusion or pneumothorax. Upper Abdomen: No acute abnormality. Musculoskeletal: No chest wall abnormality. No acute or significant osseous findings. Review of the MIP images confirms the above findings. IMPRESSION: Bilateral central pulmonary emboli in the distal right and left branch pulmonary arteries, extending into the lobar, segmental, and subsegmental branches. Mildly elevated RV/LV ratio suggesting  possible right heart strain. Correlate with echocardiography. Positive for acute PE with CT evidence of right heart strain (RV/LV Ratio = 1.02) consistent with at least submassive (intermediate risk) PE. The presence of right heart strain has been associated with an increased risk of morbidity and mortality. Please refer to the "PE Focused" order set in EPIC. Critical Value/emergent results were called by telephone at the time of interpretation on 01/18/2021 at 9:38 pm to provider [facility] , who verbally acknowledged these results. Electronically Signed   By: Caprice Renshaw   On: 01/18/2021 21:41   DG Knee Complete 4 Views Left  Result Date: 01/18/2021 CLINICAL DATA:  Knee pain EXAM: LEFT KNEE - COMPLETE 4+ VIEW COMPARISON:  None. FINDINGS: No fracture or malalignment. Mild to moderate medial joint space degenerative change. No sizable knee effusion IMPRESSION: Degenerative changes.  No acute osseous abnormality Electronically Signed   By: Jasmine Pang M.D.   On: 01/18/2021 18:24   DG Knee Complete 4 Views Right  Result Date: 01/18/2021 CLINICAL DATA:  Knee pain EXAM: RIGHT KNEE - COMPLETE 4+  VIEW COMPARISON:  03/26/2016 FINDINGS: No fracture or malalignment. Mild medial and patellofemoral degenerative changes. No substantial knee effusion IMPRESSION: Mild arthritis.  No acute osseous abnormality Electronically Signed   By: Jasmine Pang M.D.   On: 01/18/2021 18:24   VAS [place] LOWER EXTREMITY VENOUS (DVT) (ONLY [facility] & [facility] 7a-7p)  Result Date: 01/18/2021  Lower Venous DVT Study Patient Name:  AYERIM BERQUIST  Date of Exam:   01/18/2021 Medical Rec #: [id number]    Accession #:    [id number] Date of Birth: 1981-01-29   Patient Gender: F Patient Age:   1Y Exam Location:  [facility] Procedure:      VAS [place] LOWER EXTREMITY VENOUS (DVT) Referring Phys: [id number] Beyza Bellino C Niraj Kudrna --------------------------------------------------------------------------------  Indications: SOB, and RLE - pain and numbness.  Risk Factors: PE 2016. Comparison Study: No previous exams Performing Technologist: Jody Hill RVT, RDMS  Examination Guidelines: A complete evaluation includes B-mode imaging, spectral Doppler, color Doppler, and power Doppler as needed of all accessible portions of each vessel. Bilateral testing is considered an integral part of a complete examination. Limited examinations for reoccurring indications may be performed as noted. The reflux portion of the exam is performed with the patient in reverse Trendelenburg.  +---------+---------------+---------+-----------+----------+-------------------+ RIGHT    CompressibilityPhasicitySpontaneityPropertiesThrombus Aging      +---------+---------------+---------+-----------+----------+-------------------+ CFV      Full           Yes      Yes                                      +---------+---------------+---------+-----------+----------+-------------------+ SFJ      Full                                                             +---------+---------------+---------+-----------+----------+-------------------+ FV Prox  Full           Yes       Yes                                      +---------+---------------+---------+-----------+----------+-------------------+  FV Mid   None           No       No                   Acute               +---------+---------------+---------+-----------+----------+-------------------+ FV DistalNone           No       No                   Acute               +---------+---------------+---------+-----------+----------+-------------------+ PFV      Full                                                             +---------+---------------+---------+-----------+----------+-------------------+ POP      None           No       No                   Acute               +---------+---------------+---------+-----------+----------+-------------------+ PTV      None           No       No                   Acute               +---------+---------------+---------+-----------+----------+-------------------+ PERO                                                  Not visualized on                                                         this exam           +---------+---------------+---------+-----------+----------+-------------------+ Gastroc  None           No       No                   Acute               +---------+---------------+---------+-----------+----------+-------------------+ SSV      None           No       No                   Acute               +---------+---------------+---------+-----------+----------+-------------------+   +---------+---------------+---------+-----------+----------+--------------+ LEFT     CompressibilityPhasicitySpontaneityPropertiesThrombus Aging +---------+---------------+---------+-----------+----------+--------------+ CFV      Full           Yes      Yes                                 +---------+---------------+---------+-----------+----------+--------------+  SFJ      Full                                                         +---------+---------------+---------+-----------+----------+--------------+ FV Prox  Full           Yes      Yes                                 +---------+---------------+---------+-----------+----------+--------------+ FV Mid   Full           Yes      Yes                                 +---------+---------------+---------+-----------+----------+--------------+ FV DistalFull           Yes      Yes                                 +---------+---------------+---------+-----------+----------+--------------+ PFV      Full                                                        +---------+---------------+---------+-----------+----------+--------------+ POP      Full           Yes      Yes                                 +---------+---------------+---------+-----------+----------+--------------+ PTV      Full                                                        +---------+---------------+---------+-----------+----------+--------------+ PERO     Full                                                        +---------+---------------+---------+-----------+----------+--------------+    Summary: BILATERAL: -No evidence of popliteal cyst, bilaterally. RIGHT: - Findings consistent with acute deep vein thrombosis involving the right femoral vein, right popliteal vein, right posterior tibial veins, and right gastrocnemius veins. - Findings consistent with acute superficial vein thrombosis involving the right small saphenous vein.  LEFT: - There is no evidence of deep vein thrombosis in the lower extremity. - There is no evidence of superficial venous thrombosis.  - Ultrasound characteristics of enlarged lymph nodes noted in the groin.  *See table(s) above for measurements and observations.    Preliminary     Procedures Procedures  CRITICAL CARE Performed by: Wynetta Fines   Total critical care time: 30 minutes  Critical care time was exclusive of separately  billable  procedures and treating other patients.  Critical care was necessary to treat or prevent imminent or life-threatening deterioration.  Critical care was time spent personally by me on the following activities: development of treatment plan with patient and/or surrogate as well as nursing, discussions with consultants, evaluation of patient's response to treatment, examination of patient, obtaining history from patient or surrogate, ordering and performing treatments and interventions, ordering and review of laboratory studies, ordering and review of radiographic studies, pulse oximetry and re-evaluation of patient's condition.   Medications Ordered in ED Medications  sodium chloride (PF) 0.9 % injection (has no administration in time range)  morphine 4 MG/ML injection 4 mg (has no administration in time range)  heparin bolus via infusion 4,000 Units (has no administration in time range)  heparin ADULT infusion 100 units/mL (25000 units/220mL) (has no administration in time range)  oxyCODONE-acetaminophen (PERCOCET/ROXICET) 5-325 MG per tablet 1 tablet (1 tablet Oral Given 01/18/21 1745)  iohexol (OMNIPAQUE) 350 MG/ML injection 100 mL (100 mLs Intravenous Contrast Given 01/18/21 2108)    ED Course  I have reviewed the triage vital signs and the nursing notes.  Pertinent labs & imaging results that were available during my care of the patient were reviewed by me and considered in my medical decision making (see chart for details).  Clinical Course as of 01/18/21 2219  Tue Jan 18, 2021  1918 VAS [place] LOWER EXTREMITY VENOUS (DVT) (ONLY [facility] & [facility] 7a-7p) [PM]    Clinical Course User Index [PM] Wynetta Fines, MD   MDM Rules/Calculators/A&P                          MDM  MSE complete  Shannon Oneal was evaluated in Emergency Department on 01/18/2021 for the symptoms described in the history of present illness. She was evaluated in the context of the global COVID-19 pandemic, which  necessitated consideration that the patient might be at risk for infection with the SARS-CoV-2 virus that causes COVID-19. Institutional protocols and algorithms that pertain to the evaluation of patients at risk for COVID-19 are in a state of rapid change based on information released by regulatory bodies including the CDC and federal and state organizations. These policies and algorithms were followed during the patient's care in the ED.  Patient is presenting with complaint of right leg edema and pain.  She is concerned about possible recurrent DVT.  Ultrasound demonstrated evidence of DVT.  Patient with fairly vague chest discomfort reported.  This is been ongoing for at least a week or 2.  CTA demonstrated evidence of bilateral pulmonary emboli.  Onset timeframe is unclear given her vague symptoms.  Will heparinize  We will plan to admit for further work-up and observation.  Hospitalist services is aware of case and will evaluate for same. Final Clinical Impression(s) / ED Diagnoses Final diagnoses:  Deep vein thrombosis (DVT) of proximal vein of right lower extremity, unspecified chronicity (HCC)  Other pulmonary embolism without acute cor pulmonale, unspecified chronicity (HCC)    Rx / DC Orders ED Discharge Orders     None        Wynetta Fines, MD 01/18/21 2225

## 2021-01-18 NOTE — Progress Notes (Signed)
ANTICOAGULATION CONSULT NOTE - Initial Consult  Pharmacy Consult for heparin Indication: pulmonary embolus  Allergies  Allergen Reactions   Amoxapine And Related Other (See Comments)    Burns skin, hair falls out   Amoxicillin Other (See Comments)    Burns skin and hair falls out   Tramadol Itching   Rocephin [Ceftriaxone] Rash    swelling    Patient Measurements: Height: 5\' 5"  (165.1 cm) Weight: 108.4 kg (239 lb) IBW/kg (Calculated) : 57 Heparin Dosing Weight: 82kg  Vital Signs: Temp: 98 F (36.7 C) (06/28 1703) Temp Source: Oral (06/28 1703) BP: 113/73 (06/28 2100) Pulse Rate: 78 (06/28 2100)  Labs: Recent Labs    01/18/21 1948  HGB 15.3*  HCT 47.9*  PLT 241  CREATININE 1.02*    Estimated Creatinine Clearance: 89.8 mL/min (A) (by C-G formula based on SCr of 1.02 mg/dL (H)).   Medical History: Past Medical History:  Diagnosis Date   Asthma    Diabetes mellitus without complication (HCC)      Assessment: 40 yo female presents with SOB x 2 weeks and right lower leg pain.  Pt has a history of PE in 2016.  No current AC noted.  Pharmacy consulted to dose heparin for PE  Hgb 15.3, Plts WNL, Scr 1.02 Baseline labs ordered STAT  Goal of Therapy:  Heparin level 0.3-0.7 units/ml Monitor platelets by anticoagulation protocol: Yes   Plan:  Heparin bolus 4000 units x 1 Start heparin drip  1400 units/hr Heparin level in 6 hours Daily CBC  2017 RPh 01/18/2021, 10:03 PM

## 2021-01-18 NOTE — ED Triage Notes (Signed)
Patient reports that she has been SOB x 2 weeks - currently presents with right lower leg pain with numbness.  Patient denies any type of injury.  Patient reports that she does have a history of Pulmonary embolism in 2016

## 2021-01-18 NOTE — H&P (Signed)
History and Physical    Shannon Oneal WUJ:[id number] DOB: 04/17/1981 DOA: 01/18/2021  PCP: Pcp, No  Patient coming from: Home  I have personally briefly reviewed patient's old medical records in [facility]  Chief Complaint: Right leg swelling, dyspnea  HPI: Shannon Oneal is a 40 y.o. female with medical history significant for type 2 diabetes, history of PE in 2016 (completed 6 months of anticoagulation) who presented to the ED for evaluation of right leg swelling and dyspnea.  Patient states that she has been experiencing 2 weeks of intermittent shortness of breath which occurs even while at rest.  She has associated central chest pressure without pain during these episodes which last around 10-15 minutes before resolving on their own.  She says this morning she was getting out of bed when she felt her left knee popped out of place before returning to normal position.  Later today she noticed new onset of swelling and pain in her right leg.  She says her right leg appeared about twice as large as her left.  She reports a prior history of PE in 2016 requiring hospitalization at [facility].  She says she was initially treated with Eliquis for about 3 months until she was unable to afford it after she lost her insurance.  She was switched to Pradaxa and completed a total of approximately 6 months anticoagulation at which time it was felt she did not need to continue it further.  She denies any significant tobacco history.  She is not using any estrogen containing products.  She denies any recent surgery.  She states that she works from home and sits for up to 10 hours/day but takes breaks and walks around during her work schedule.  She reports a history of blood clots in her father.  Patient states that she has not been taking her Toujeo recently and has been having to ration her metformin and glipizide as she had lost her insurance for a while but has now regained it.  ED Course:  Initial vitals  showed BP 128/96, pulse 89, RR 21, temp 98.0 F, SPO2 99% on room air.  Labs show WBC 8.9, hemoglobin 15.3, platelets 241,000, sodium 133, potassium 4.2, bicarb 25, BUN 11, creatinine 1.02, serum glucose 358.  SARS-CoV-2 PCR panel collected and pending.  Right knee x-ray showed mild arthritis without acute osseous abnormality.  Left knee x-ray showed degenerative changes without acute osseous abnormality.  CTA chest PE study showed bilateral central pulmonary emboli in the distal right and left branch pulmonary arteries, extending into the lobar, segmental, and subsegmental branches.  Possible right heart strain changes noted.  Lower extremity ultrasounds showed findings consistent with acute DVT involving the right femoral, right popliteal, right posterior tibial, and right gastrocnemius veins.  Acute superficial vein thrombosis involving the right small saphenous vein also noted.  No evidence of DVT in the LLE.  Patient was started on IV heparin and the hospitalist service was consulted to admit for further evaluation and management.  Review of Systems: All systems reviewed and are negative except as documented in history of present illness above.   Past Medical History:  Diagnosis Date   Asthma    Diabetes mellitus without complication (HCC)    PE (pulmonary thromboembolism) (HCC) 2016    History reviewed. No pertinent surgical history.  Social History:  reports that she has never smoked. She has never used smokeless tobacco. She reports current alcohol use. No history on file for drug use.  Allergies  Allergen Reactions   Amoxapine And Related Other (See Comments)    Burns skin, hair falls out   Amoxicillin Other (See Comments)    Burns skin and hair falls out   Tramadol Itching   Rocephin [Ceftriaxone] Rash    swelling    Family History  Problem Relation Age of Onset   Deep vein thrombosis Father      Prior to Admission medications   Medication Sig Start Date End  Date Taking? Authorizing Provider  DULoxetine (CYMBALTA) 30 MG capsule Take 30 mg by mouth daily. 01/18/20   [provider]  gabapentin (NEURONTIN) 300 MG capsule Take 1 capsule (300 mg total) by mouth 3 (three) times daily. Patient not taking: Reported on 06/02/2020 10/31/19   Pollyann Savoy, MD  glipiZIDE (GLUCOTROL) 10 MG tablet Take 10 mg by mouth daily before breakfast.    [provider]  HYDROcodone-acetaminophen (NORCO/VICODIN) 5-325 MG tablet Take 1-2 tablets by mouth every 4 (four) hours as needed. Patient not taking: Reported on 06/02/2020 10/27/19   Lorre Nick, MD  ibuprofen (ADVIL) 200 MG tablet Take 400 mg by mouth 2 (two) times daily as needed for moderate pain.    [provider]  metFORMIN (GLUCOPHAGE) 1000 MG tablet Take 1,000 mg by mouth 2 (two) times daily. 01/18/20   [provider]  NOVOLIN N RELION 100 UNIT/ML injection Inject 0.1 mLs (10 Units total) into the skin 2 (two) times daily. Patient not taking: Reported on 06/02/2020 10/31/19 11/30/19  Pollyann Savoy, MD  promethazine (PHENERGAN) 25 MG tablet Take 1 tablet (25 mg total) by mouth every 6 (six) hours as needed for nausea or vomiting (headache). 06/04/20   Charlynne Pander, MD  TOUJEO SOLOSTAR 300 UNIT/ML Solostar Pen Inject 20 Units into the skin in the morning and at bedtime.  01/18/20   [provider]  metoCLOPramide (REGLAN) 10 MG tablet Take 1 tablet (10 mg total) by mouth every 6 (six) hours as needed (Nausea or headache). Patient not taking: Reported on 10/12/2017 02/18/13 10/31/19  Dione Booze, MD    Physical Exam: Vitals:   01/18/21 2000 01/18/21 2030 01/18/21 2100 01/18/21 2200  BP: 112/80 121/88 113/73 (!) 121/94  Pulse: 92 84 78 87  Resp: (!) 22 17 18 16   Temp:      TempSrc:      SpO2: 97% 99% 97% 98%  Weight:      Height:       Constitutional: NAD, calm, comfortable Eyes: PERRL, lids and conjunctivae normal ENMT: Mucous membranes are moist.  Posterior pharynx clear of any exudate or lesions.Normal dentition.  Neck: normal, supple, no masses. Respiratory: clear to auscultation bilaterally, no wheezing, no crackles. Normal respiratory effort. No accessory muscle use.  Cardiovascular: Tachycardic, no murmurs / rubs / gallops.  Trace right lower extremity edema. 2+ pedal pulses. Abdomen: no tenderness, no masses palpated. No hepatosplenomegaly. Bowel sounds positive.  Musculoskeletal: no clubbing / cyanosis. No joint deformity upper and lower extremities. Good ROM, no contractures. Normal muscle tone.  Skin: no rashes, lesions, ulcers. No induration Neurologic: CN 2-12 grossly intact. Sensation intact. Strength 5/5 in all 4.  Psychiatric: Normal judgment and insight. Alert and oriented x 3. Normal mood.   Labs on Admission: I have personally reviewed following labs and imaging studies  CBC: Recent Labs  Lab 01/18/21 1948  WBC 8.9  NEUTROABS 4.3  HGB 15.3*  HCT 47.9*  MCV 74.3*  PLT 241   Basic Metabolic Panel: Recent Labs  Lab 01/18/21 1948  NA 133*  K 4.2  CL 102  CO2 25  GLUCOSE 358*  BUN 11  CREATININE 1.02*  CALCIUM 9.2   GFR: Estimated Creatinine Clearance: 89.8 mL/min (A) (by C-G formula based on SCr of 1.02 mg/dL (H)). Liver Function Tests: No results for input(s): AST, ALT, ALKPHOS, BILITOT, PROT, ALBUMIN in the last 168 hours. No results for input(s): LIPASE, AMYLASE in the last 168 hours. No results for input(s): AMMONIA in the last 168 hours. Coagulation Profile: Recent Labs  Lab 01/18/21 2158  INR 1.1   Cardiac Enzymes: No results for input(s): CKTOTAL, CKMB, CKMBINDEX, TROPONINI in the last 168 hours. BNP (last 3 results) No results for input(s): PROBNP in the last 8760 hours. HbA1C: No results for input(s): HGBA1C in the last 72 hours. CBG: No results for input(s): GLUCAP in the last 168 hours. Lipid Profile: No results for input(s): CHOL, HDL, LDLCALC, TRIG, CHOLHDL, LDLDIRECT in the  last 72 hours. Thyroid Function Tests: No results for input(s): TSH, T4TOTAL, FREET4, T3FREE, THYROIDAB in the last 72 hours. Anemia Panel: No results for input(s): VITAMINB12, FOLATE, FERRITIN, TIBC, IRON, RETICCTPCT in the last 72 hours. Urine analysis:    Component Value Date/Time   COLORURINE YELLOW 06/02/2020 1255   APPEARANCEUR CLEAR 06/02/2020 1255   LABSPEC 1.018 06/02/2020 1255   PHURINE 5.0 06/02/2020 1255   GLUCOSEU NEGATIVE 06/02/2020 1255   HGBUR NEGATIVE 06/02/2020 1255   BILIRUBINUR NEGATIVE 06/02/2020 1255   KETONESUR 5 (A) 06/02/2020 1255   PROTEINUR NEGATIVE 06/02/2020 1255   NITRITE NEGATIVE 06/02/2020 1255   LEUKOCYTESUR NEGATIVE 06/02/2020 1255    Radiological Exams on Admission: CT Angio Chest PE W and/or Wo Contrast  Result Date: 01/18/2021 CLINICAL DATA:  PE suspected, low/intermediate prob, positive D-dimer dvt positive EXAM: CT ANGIOGRAPHY CHEST WITH CONTRAST TECHNIQUE: Multidetector CT imaging of the chest was performed using the standard protocol during bolus administration of intravenous contrast. Multiplanar CT image reconstructions and MIPs were obtained to evaluate the vascular anatomy. CONTRAST:  OMNIPAQUE IOHEXOL 350 MG/ML SOLN COMPARISON:  None. FINDINGS: Cardiovascular: There are bilateral central pulmonary emboli in the distal right and left branch pulmonary arteries. Emboli extend into into the lobar, segmental, and subsegmental branches of the lower lobes bilaterally. There is similar extension in the the upper lobes but to a lesser degree than the lower lobes. RV/LV ratio is approximately 1.02. Mediastinum/Nodes: No enlarged mediastinal, hilar, or axillary lymph nodes. Thyroid gland, trachea, and esophagus demonstrate no significant findings. Lungs/Pleura: Lungs are clear. No pleural effusion or pneumothorax. Upper Abdomen: No acute abnormality. Musculoskeletal: No chest wall abnormality. No acute or significant osseous findings. Review of the MIP  images confirms the above findings. IMPRESSION: Bilateral central pulmonary emboli in the distal right and left branch pulmonary arteries, extending into the lobar, segmental, and subsegmental branches. Mildly elevated RV/LV ratio suggesting possible right heart strain. Correlate with echocardiography. Positive for acute PE with CT evidence of right heart strain (RV/LV Ratio = 1.02) consistent with at least submassive (intermediate risk) PE. The presence of right heart strain has been associated with an increased risk of morbidity and mortality. Please refer to the "PE Focused" order set in EPIC. Critical Value/emergent results were called by telephone at the time of interpretation on 01/18/2021 at 9:38 pm to provider [facility] , who verbally acknowledged these results. Electronically Signed   By: Caprice Renshaw   On: 01/18/2021 21:41   DG Knee Complete 4 Views Left  Result Date: 01/18/2021 CLINICAL  DATA:  Knee pain EXAM: LEFT KNEE - COMPLETE 4+ VIEW COMPARISON:  None. FINDINGS: No fracture or malalignment. Mild to moderate medial joint space degenerative change. No sizable knee effusion IMPRESSION: Degenerative changes.  No acute osseous abnormality Electronically Signed   By: Jasmine PangKim  Fujinaga M.D.   On: 01/18/2021 18:24   DG Knee Complete 4 Views Right  Result Date: 01/18/2021 CLINICAL DATA:  Knee pain EXAM: RIGHT KNEE - COMPLETE 4+ VIEW COMPARISON:  03/26/2016 FINDINGS: No fracture or malalignment. Mild medial and patellofemoral degenerative changes. No substantial knee effusion IMPRESSION: Mild arthritis.  No acute osseous abnormality Electronically Signed   By: Jasmine PangKim  Fujinaga M.D.   On: 01/18/2021 18:24   VAS US LOWER EXTREMITY VENOUS (DVT) (ONLY [facility] & [facility] 7a-7p)  Result Date: 01/18/2021  Lower Venous DVT Study Patient Name:  Shannon FoundSONYA Oneal  Date of Exam:   01/18/2021 Medical Rec #: [id number]    Accession #:    [id number] Date of Birth: [birth year]   Patient Gender: F Patient Age:   [id number] [facility] Procedure:      VAS US LOWER EXTREMITY VENOUS (DVT) Referring Phys: [id number] PETER C MESSICK --------------------------------------------------------------------------------  Indications: SOB, and RLE - pain and numbness.  Risk Factors: PE 2016. Comparison Study: No previous exams Performing Technologist: Jody Hill RVT, RDMS  Examination Guidelines: A complete evaluation includes B-mode imaging, spectral Doppler, color Doppler, and power Doppler as needed of all accessible portions of each vessel. Bilateral testing is considered an integral part of a complete examination. Limited examinations for reoccurring indications may be performed as noted. The reflux portion of the exam is performed with the patient in reverse Trendelenburg.  +---------+---------------+---------+-----------+----------+-------------------+ RIGHT    CompressibilityPhasicitySpontaneityPropertiesThrombus Aging      +---------+---------------+---------+-----------+----------+-------------------+ CFV      Full           Yes      Yes                                      +---------+---------------+---------+-----------+----------+-------------------+ SFJ      Full                                                             +---------+---------------+---------+-----------+----------+-------------------+ FV Prox  Full           Yes      Yes                                      +---------+---------------+---------+-----------+----------+-------------------+ FV Mid   None           No       No                   Acute               +---------+---------------+---------+-----------+----------+-------------------+ FV DistalNone           No       No                   Acute               +---------+---------------+---------+-----------+----------+-------------------+  PFV      Full                                                              +---------+---------------+---------+-----------+----------+-------------------+ POP      None           No       No                   Acute               +---------+---------------+---------+-----------+----------+-------------------+ PTV      None           No       No                   Acute               +---------+---------------+---------+-----------+----------+-------------------+ PERO                                                  Not visualized on                                                         this exam           +---------+---------------+---------+-----------+----------+-------------------+ Gastroc  None           No       No                   Acute               +---------+---------------+---------+-----------+----------+-------------------+ SSV      None           No       No                   Acute               +---------+---------------+---------+-----------+----------+-------------------+   +---------+---------------+---------+-----------+----------+--------------+ LEFT     CompressibilityPhasicitySpontaneityPropertiesThrombus Aging +---------+---------------+---------+-----------+----------+--------------+ CFV      Full           Yes      Yes                                 +---------+---------------+---------+-----------+----------+--------------+ SFJ      Full                                                        +---------+---------------+---------+-----------+----------+--------------+ FV Prox  Full           Yes      Yes                                 +---------+---------------+---------+-----------+----------+--------------+  FV Mid   Full           Yes      Yes                                 +---------+---------------+---------+-----------+----------+--------------+ FV DistalFull           Yes      Yes                                  +---------+---------------+---------+-----------+----------+--------------+ PFV      Full                                                        +---------+---------------+---------+-----------+----------+--------------+ POP      Full           Yes      Yes                                 +---------+---------------+---------+-----------+----------+--------------+ PTV      Full                                                        +---------+---------------+---------+-----------+----------+--------------+ PERO     Full                                                        +---------+---------------+---------+-----------+----------+--------------+    Summary: BILATERAL: -No evidence of popliteal cyst, bilaterally. RIGHT: - Findings consistent with acute deep vein thrombosis involving the right femoral vein, right popliteal vein, right posterior tibial veins, and right gastrocnemius veins. - Findings consistent with acute superficial vein thrombosis involving the right small saphenous vein.  LEFT: - There is no evidence of deep vein thrombosis in the lower extremity. - There is no evidence of superficial venous thrombosis.  - Ultrasound characteristics of enlarged lymph nodes noted in the groin.  *See table(s) above for measurements and observations.    Preliminary     EKG: Personally reviewed.  Ordered and pending.  Assessment/Plan Principal Problem:   Acute pulmonary embolus (HCC) Active Problems:   Acute deep vein thrombosis (DVT) of right lower extremity (HCC)   Type 2 diabetes mellitus with hyperglycemia (HCC)   Shannon Oneal is a 40 y.o. female with medical history significant for type 2 diabetes, history of PE in 2016 (completed 6 months of anticoagulation) who is admitted with acute PE and DVT.  Acute bilateral central pulmonary emboli and RLE DVT: History of prior PE in 2016, completed approximately 6 months anticoagulation at that time.  Question of possible right  heart strain on CT.  Currently hemodynamically stable. -Continue IV heparin -Echocardiogram -Supplemental oxygen if needed  Type 2 diabetes with hyperglycemia: Restart Toujeo 30 units twice daily, add SSI.  Hold glipizide and metformin for now.  DVT prophylaxis: IV  heparin Code Status: Full code, confirmed with patient Family Communication: Discussed with patient, she has discussed with family Disposition Plan: From home and likely discharge to home pending clinical progress Consults called: None Level of care: Telemetry Admission status:  Status is: Observation  The patient remains OBS appropriate and will d/c before 2 midnights.  Dispo: The patient is from: Home              Anticipated d/c is to: Home              Patient currently is not medically stable to d/c.   Difficult to place patient No   Darreld Mclean MD [facility]  If 7PM-7AM, please contact night-coverage [email or web address]  01/18/2021, 11:30 PM

## 2021-01-19 ENCOUNTER — Observation Stay (HOSPITAL_COMMUNITY): Payer: BC Managed Care – PPO

## 2021-01-19 DIAGNOSIS — Z7984 Long term (current) use of oral hypoglycemic drugs: Secondary | ICD-10-CM | POA: Diagnosis not present

## 2021-01-19 DIAGNOSIS — Z79899 Other long term (current) drug therapy: Secondary | ICD-10-CM | POA: Diagnosis not present

## 2021-01-19 DIAGNOSIS — I2692 Saddle embolus of pulmonary artery without acute cor pulmonale: Secondary | ICD-10-CM | POA: Diagnosis not present

## 2021-01-19 DIAGNOSIS — I82411 Acute embolism and thrombosis of right femoral vein: Secondary | ICD-10-CM | POA: Diagnosis present

## 2021-01-19 DIAGNOSIS — Z794 Long term (current) use of insulin: Secondary | ICD-10-CM

## 2021-01-19 DIAGNOSIS — M1711 Unilateral primary osteoarthritis, right knee: Secondary | ICD-10-CM | POA: Diagnosis present

## 2021-01-19 DIAGNOSIS — I82431 Acute embolism and thrombosis of right popliteal vein: Secondary | ICD-10-CM | POA: Diagnosis present

## 2021-01-19 DIAGNOSIS — I82461 Acute embolism and thrombosis of right calf muscular vein: Secondary | ICD-10-CM | POA: Diagnosis present

## 2021-01-19 DIAGNOSIS — I2699 Other pulmonary embolism without acute cor pulmonale: Secondary | ICD-10-CM | POA: Diagnosis present

## 2021-01-19 DIAGNOSIS — E1165 Type 2 diabetes mellitus with hyperglycemia: Secondary | ICD-10-CM

## 2021-01-19 DIAGNOSIS — I82441 Acute embolism and thrombosis of right tibial vein: Secondary | ICD-10-CM | POA: Diagnosis present

## 2021-01-19 DIAGNOSIS — Z86711 Personal history of pulmonary embolism: Secondary | ICD-10-CM | POA: Diagnosis not present

## 2021-01-19 DIAGNOSIS — J45909 Unspecified asthma, uncomplicated: Secondary | ICD-10-CM | POA: Diagnosis present

## 2021-01-19 DIAGNOSIS — I2602 Saddle embolus of pulmonary artery with acute cor pulmonale: Secondary | ICD-10-CM | POA: Diagnosis not present

## 2021-01-19 DIAGNOSIS — Z86718 Personal history of other venous thrombosis and embolism: Secondary | ICD-10-CM | POA: Diagnosis not present

## 2021-01-19 DIAGNOSIS — Z20822 Contact with and (suspected) exposure to covid-19: Secondary | ICD-10-CM | POA: Diagnosis present

## 2021-01-19 HISTORY — DX: Other pulmonary embolism without acute cor pulmonale: I26.99

## 2021-01-19 LAB — HEPARIN LEVEL (UNFRACTIONATED)
Heparin Unfractionated: 0.26 IU/mL — ABNORMAL LOW (ref 0.30–0.70)
Heparin Unfractionated: 0.2 IU/mL — ABNORMAL LOW (ref 0.30–0.70)
Heparin Unfractionated: 0.54 IU/mL (ref 0.30–0.70)

## 2021-01-19 LAB — BASIC METABOLIC PANEL
Anion gap: 8 (ref 5–15)
BUN: 11 mg/dL (ref 6–20)
CO2: 24 mmol/L (ref 22–32)
Calcium: 9.3 mg/dL (ref 8.9–10.3)
Chloride: 102 mmol/L (ref 98–111)
Creatinine, Ser: 0.81 mg/dL (ref 0.44–1.00)
GFR, Estimated: >60 mL/min (ref >60)
Glucose, Bld: 312 mg/dL — ABNORMAL HIGH (ref 70–99)
Potassium: 4 mmol/L (ref 3.5–5.1)
Sodium: 134 mmol/L — ABNORMAL LOW (ref 135–145)

## 2021-01-19 LAB — CBG MONITORING, ED
Glucose-Capillary: 242 mg/dL — ABNORMAL HIGH (ref 70–99)
Glucose-Capillary: 267 mg/dL — ABNORMAL HIGH (ref 70–99)
Glucose-Capillary: 283 mg/dL — ABNORMAL HIGH (ref 70–99)

## 2021-01-19 LAB — RESP PANEL BY RT-PCR (FLU A&B, COVID) ARPGX2
Influenza A by PCR: NEGATIVE
Influenza B by PCR: NEGATIVE
SARS Coronavirus 2 by RT PCR: NEGATIVE

## 2021-01-19 LAB — HIV ANTIBODY (ROUTINE TESTING W REFLEX): HIV Screen 4th Generation wRfx: NONREACTIVE

## 2021-01-19 LAB — ECHOCARDIOGRAM COMPLETE
Area-P 1/2: 3.88 cm2
Calc EF: 62.5 %
Height: 65 in
S' Lateral: 2.3 cm
Single Plane A2C EF: 62.1 %
Single Plane A4C EF: 62.7 %
Weight: 3824 oz

## 2021-01-19 LAB — CBC
HCT: 46.6 % — ABNORMAL HIGH (ref 36.0–46.0)
Hemoglobin: 14.5 g/dL (ref 12.0–15.0)
MCH: 23.4 pg — ABNORMAL LOW (ref 26.0–34.0)
MCHC: 31.1 g/dL (ref 30.0–36.0)
MCV: 75.2 fL — ABNORMAL LOW (ref 80.0–100.0)
Platelets: 229 10*3/uL (ref 150–400)
RBC: 6.2 MIL/uL — ABNORMAL HIGH (ref 3.87–5.11)
RDW: 13.9 % (ref 11.5–15.5)
WBC: 8.8 10*3/uL (ref 4.0–10.5)
nRBC: 0 % (ref 0.0–0.2)

## 2021-01-19 LAB — GLUCOSE, CAPILLARY
Glucose-Capillary: 276 mg/dL — ABNORMAL HIGH (ref 70–99)
Glucose-Capillary: 154 mg/dL — ABNORMAL HIGH (ref 70–99)
Glucose-Capillary: 277 mg/dL — ABNORMAL HIGH (ref 70–99)

## 2021-01-19 LAB — ANTITHROMBIN III: AntiThromb III Func: 96 % (ref 75–120)

## 2021-01-19 MED ORDER — DIPHENHYDRAMINE HCL 25 MG PO CAPS
25.0000 mg | ORAL_CAPSULE | ORAL | Status: AC | PRN
  Administered 2021-01-19 (×2): 25 mg via ORAL
  Filled 2021-01-19 (×2): qty 1

## 2021-01-19 MED ORDER — HEPARIN BOLUS VIA INFUSION
2500.0000 [IU] | Freq: Once | INTRAVENOUS | Status: AC
  Administered 2021-01-19: 2500 [IU] via INTRAVENOUS
  Filled 2021-01-19: qty 2500

## 2021-01-19 MED ORDER — HEPARIN (PORCINE) 25000 UT/250ML-% IV SOLN
1900.0000 [IU]/h | INTRAVENOUS | Status: AC
  Administered 2021-01-20: 1900 [IU]/h via INTRAVENOUS
  Filled 2021-01-19: qty 250

## 2021-01-19 NOTE — Progress Notes (Addendum)
ANTICOAGULATION CONSULT NOTE -   Pharmacy Consult for heparin Indication: pulmonary embolus  Allergies  Allergen Reactions   Amoxapine And Related Other (See Comments)    Burns skin, hair falls out   Amoxicillin Other (See Comments)    Burns skin and hair falls out   Tramadol Itching   Rocephin [Ceftriaxone] Rash    swelling    Patient Measurements: Height: 5\' 5"  (165.1 cm) Weight: 108.4 kg (239 lb) IBW/kg (Calculated) : 57 Heparin Dosing Weight: 82kg  Vital Signs: BP: 115/85 (06/29 0400) Pulse Rate: 90 (06/29 0400)  Labs: Recent Labs    01/18/21 1948 01/18/21 2158 01/19/21 0406  HGB 15.3*  --  14.5  HCT 47.9*  --  46.6*  PLT 241  --  229  APTT  --  <20*  --   LABPROT  --  13.7  --   INR  --  1.1  --   HEPARINUNFRC  --   --  0.26*  CREATININE 1.02*  --   --      Estimated Creatinine Clearance: 89.8 mL/min (A) (by C-G formula based on SCr of 1.02 mg/dL (H)).   Medical History: Past Medical History:  Diagnosis Date   Asthma    Diabetes mellitus without complication (HCC)    PE (pulmonary thromboembolism) (HCC) 2016     Assessment: 40 yo female presents with SOB x 2 weeks and right lower leg pain.  Pt has a history of PE in 2016.  No current AC noted.  Pharmacy consulted to dose heparin for PE  01/19/2021 HL 0.26 subtherapeutic on 1400 units/hr CBC WNL No bleeding noted per RN  Goal of Therapy:  Heparin level 0.3-0.7 units/ml Monitor platelets by anticoagulation protocol: Yes   Plan:  Increase heparin drip to 1600 units/hr Heparin level in 6 hours Daily CBC  01/21/2021 RPh 01/19/2021, 5:13 AM

## 2021-01-19 NOTE — Progress Notes (Signed)
  Echocardiogram 2D Echocardiogram has been performed.  Janalyn Harder 01/19/2021, 3:12 PM

## 2021-01-19 NOTE — ED Notes (Signed)
Patient was given her lunch tray. 

## 2021-01-19 NOTE — Progress Notes (Addendum)
2D echo attempted for a second time, patient transferring to room. Will try later 2D Echo attempted, RN said to try later.  Janalyn Harder 01/19/2021, 12:10 PM

## 2021-01-19 NOTE — Progress Notes (Signed)
[facility] PROGRESS NOTE    Progress Note  Kasey Hansell  AYT:[id number] DOB: 1981-02-21 DOA: 01/18/2021 PCP: Pcp, No     Brief Narrative:   Shannon Oneal is an 40 y.o. female past medical history diabetes mellitus type 2, history of PE in 2016 completed 6 months of anticoagulation presents to the ED for evaluation of right leg swelling and dyspnea, CTA of the chest was positive for bilateral pulmonary emboli.  Lower extremity Doppler was positive for DVT   Assessment/Plan:   Acute pulmonary embolus (HCC)/Acute deep vein thrombosis (DVT) of right lower extremity (HCC): Started on IV heparin, 2D echo pending. Blood pressure stable satting 96% on room air. Patient is mildly tachycardic check for hypercoagulable panel and ANA She will need to follow-up with hematology as an outpatient.  Type 2 diabetes mellitus with hyperglycemia (HCC) Started Toujeo 20 Started back on his Toujeo twice a day sholding glipizide and metformin for now, sliding scale added   DVT prophylaxis: heparin Family Communication:none Status is: Observation  The patient will require care spanning > 2 midnights and should be moved to inpatient because: Hemodynamically unstable  Dispo: The patient is from: Home              Anticipated d/c is to: Home              Patient currently is not medically stable to d/c.   Difficult to place patient No        Code Status:     Code Status Orders  (From admission, onward)           Start     Ordered   01/18/21 2319  Full code  Continuous        01/18/21 2320           Code Status History     This patient has a current code status but no historical code status.         IV Access:   Peripheral IV   Procedures and diagnostic studies:   CT Angio Chest PE W and/or Wo Contrast  Result Date: 01/18/2021 CLINICAL DATA:  PE suspected, low/intermediate prob, positive D-dimer dvt positive EXAM: CT ANGIOGRAPHY CHEST WITH CONTRAST TECHNIQUE:  Multidetector CT imaging of the chest was performed using the standard protocol during bolus administration of intravenous contrast. Multiplanar CT image reconstructions and MIPs were obtained to evaluate the vascular anatomy. CONTRAST:  OMNIPAQUE IOHEXOL 350 MG/ML SOLN COMPARISON:  None. FINDINGS: Cardiovascular: There are bilateral central pulmonary emboli in the distal right and left branch pulmonary arteries. Emboli extend into into the lobar, segmental, and subsegmental branches of the lower lobes bilaterally. There is similar extension in the the upper lobes but to a lesser degree than the lower lobes. RV/LV ratio is approximately 1.02. Mediastinum/Nodes: No enlarged mediastinal, hilar, or axillary lymph nodes. Thyroid gland, trachea, and esophagus demonstrate no significant findings. Lungs/Pleura: Lungs are clear. No pleural effusion or pneumothorax. Upper Abdomen: No acute abnormality. Musculoskeletal: No chest wall abnormality. No acute or significant osseous findings. Review of the MIP images confirms the above findings. IMPRESSION: Bilateral central pulmonary emboli in the distal right and left branch pulmonary arteries, extending into the lobar, segmental, and subsegmental branches. Mildly elevated RV/LV ratio suggesting possible right heart strain. Correlate with echocardiography. Positive for acute PE with CT evidence of right heart strain (RV/LV Ratio = 1.02) consistent with at least submassive (intermediate risk) PE. The presence of right heart strain has been associated with an increased  risk of morbidity and mortality. Please refer to the "PE Focused" order set in EPIC. Critical Value/emergent results were called by telephone at the time of interpretation on 01/18/2021 at 9:38 pm to provider [facility] , who verbally acknowledged these results. Electronically Signed   By: Caprice Renshaw   On: 01/18/2021 21:41   DG Knee Complete 4 Views Left  Result Date: 01/18/2021 CLINICAL DATA:  Knee  pain EXAM: LEFT KNEE - COMPLETE 4+ VIEW COMPARISON:  None. FINDINGS: No fracture or malalignment. Mild to moderate medial joint space degenerative change. No sizable knee effusion IMPRESSION: Degenerative changes.  No acute osseous abnormality Electronically Signed   By: Jasmine Pang M.D.   On: 01/18/2021 18:24   DG Knee Complete 4 Views Right  Result Date: 01/18/2021 CLINICAL DATA:  Knee pain EXAM: RIGHT KNEE - COMPLETE 4+ VIEW COMPARISON:  03/26/2016 FINDINGS: No fracture or malalignment. Mild medial and patellofemoral degenerative changes. No substantial knee effusion IMPRESSION: Mild arthritis.  No acute osseous abnormality Electronically Signed   By: Jasmine Pang M.D.   On: 01/18/2021 18:24   VAS [place] LOWER EXTREMITY VENOUS (DVT) (ONLY [facility] & [facility] 7a-7p)  Result Date: 01/19/2021  Lower Venous DVT Study Patient Name:  Shannon Oneal  Date of Exam:   01/18/2021 Medical Rec #: [id number]    Accession #:    [id number] Date of Birth: 09-16-1980   Patient Gender: F Patient Age:   50Y Exam Location:  [facility] Procedure:      VAS [place] LOWER EXTREMITY VENOUS (DVT) Referring Phys: [id number] PETER C MESSICK --------------------------------------------------------------------------------  Indications: SOB, and RLE - pain and numbness.  Risk Factors: PE 2016. Comparison Study: No previous exams Performing Technologist: Jody Hill RVT, RDMS  Examination Guidelines: A complete evaluation includes B-mode imaging, spectral Doppler, color Doppler, and power Doppler as needed of all accessible portions of each vessel. Bilateral testing is considered an integral part of a complete examination. Limited examinations for reoccurring indications may be performed as noted. The reflux portion of the exam is performed with the patient in reverse Trendelenburg.  +---------+---------------+---------+-----------+----------+-------------------+ RIGHT    CompressibilityPhasicitySpontaneityPropertiesThrombus Aging       +---------+---------------+---------+-----------+----------+-------------------+ CFV      Full           Yes      Yes                                      +---------+---------------+---------+-----------+----------+-------------------+ SFJ      Full                                                             +---------+---------------+---------+-----------+----------+-------------------+ FV Prox  Full           Yes      Yes                                      +---------+---------------+---------+-----------+----------+-------------------+ FV Mid   None           No       No  Acute               +---------+---------------+---------+-----------+----------+-------------------+ FV DistalNone           No       No                   Acute               +---------+---------------+---------+-----------+----------+-------------------+ PFV      Full                                                             +---------+---------------+---------+-----------+----------+-------------------+ POP      None           No       No                   Acute               +---------+---------------+---------+-----------+----------+-------------------+ PTV      None           No       No                   Acute               +---------+---------------+---------+-----------+----------+-------------------+ PERO                                                  Not visualized on                                                         this exam           +---------+---------------+---------+-----------+----------+-------------------+ Gastroc  None           No       No                   Acute               +---------+---------------+---------+-----------+----------+-------------------+ SSV      None           No       No                   Acute               +---------+---------------+---------+-----------+----------+-------------------+    +---------+---------------+---------+-----------+----------+--------------+ LEFT     CompressibilityPhasicitySpontaneityPropertiesThrombus Aging +---------+---------------+---------+-----------+----------+--------------+ CFV      Full           Yes      Yes                                 +---------+---------------+---------+-----------+----------+--------------+ SFJ      Full                                                        +---------+---------------+---------+-----------+----------+--------------+  FV Prox  Full           Yes      Yes                                 +---------+---------------+---------+-----------+----------+--------------+ FV Mid   Full           Yes      Yes                                 +---------+---------------+---------+-----------+----------+--------------+ FV DistalFull           Yes      Yes                                 +---------+---------------+---------+-----------+----------+--------------+ PFV      Full                                                        +---------+---------------+---------+-----------+----------+--------------+ POP      Full           Yes      Yes                                 +---------+---------------+---------+-----------+----------+--------------+ PTV      Full                                                        +---------+---------------+---------+-----------+----------+--------------+ PERO     Full                                                        +---------+---------------+---------+-----------+----------+--------------+     Summary: BILATERAL: -No evidence of popliteal cyst, bilaterally. RIGHT: - Findings consistent with acute deep vein thrombosis involving the right femoral vein, right popliteal vein, right posterior tibial veins, and right gastrocnemius veins. - Findings consistent with acute superficial vein thrombosis involving the right small saphenous vein.   LEFT: - There is no evidence of deep vein thrombosis in the lower extremity. - There is no evidence of superficial venous thrombosis.  - Ultrasound characteristics of enlarged lymph nodes noted in the groin.  *See table(s) above for measurements and observations. Electronically signed by Waverly Ferrarihristopher Dickson MD on 01/19/2021 at 8:15:17 AM.    Final      Medical Consultants:   None.   Subjective:    Allean FoundSonya Cerreta relates continues to be short of breath or having the sensation and mild chest pain  Objective:    Vitals:   01/19/21 0600 01/19/21 0630 01/19/21 0645 01/19/21 0711  BP: (!) 123/94 (!) 123/110  (!) 115/91  Pulse: 81  91 99  Resp: 15  16 14   Temp:    97.6 F (36.4 C)  TempSrc:  Oral  SpO2: 93% 99% 96% 96%  Weight:      Height:       SpO2: 96 %  No intake or output data in the 24 hours ending 01/19/21 0942 Filed Weights   01/18/21 1705  Weight: 108.4 kg    Exam: General exam: In no acute distress. Respiratory system: Good air movement and clear to auscultation. Cardiovascular system: S1 & S2 heard, RRR. No JV. Gastrointestinal system: Abdomen is nondistended, soft and nontender.  Extremities: No pedal edema. Skin: No rashes, lesions or ulcers Psychiatry: Judgement and insight appear normal. Mood & affect appropriate.    Data Reviewed:    Labs: Basic Metabolic Panel: Recent Labs  Lab 01/18/21 1948 01/19/21 0406  NA 133* 134*  K 4.2 4.0  CL 102 102  CO2 25 24  GLUCOSE 358* 312*  BUN 11 11  CREATININE 1.02* 0.81  CALCIUM 9.2 9.3   GFR Estimated Creatinine Clearance: 113.1 mL/min (by C-G formula based on SCr of 0.81 mg/dL). Liver Function Tests: No results for input(s): AST, ALT, ALKPHOS, BILITOT, PROT, ALBUMIN in the last 168 hours. No results for input(s): LIPASE, AMYLASE in the last 168 hours. No results for input(s): AMMONIA in the last 168 hours. Coagulation profile Recent Labs  Lab 01/18/21 2158  INR 1.1   COVID-19 Labs  No results  for input(s): DDIMER, FERRITIN, LDH, CRP in the last 72 hours.  Lab Results  Component Value Date   SARSCOV2NAA NEGATIVE 01/18/2021    CBC: Recent Labs  Lab 01/18/21 1948 01/19/21 0406  WBC 8.9 8.8  NEUTROABS 4.3  --   HGB 15.3* 14.5  HCT 47.9* 46.6*  MCV 74.3* 75.2*  PLT 241 229   Cardiac Enzymes: No results for input(s): CKTOTAL, CKMB, CKMBINDEX, TROPONINI in the last 168 hours. BNP (last 3 results) No results for input(s): PROBNP in the last 8760 hours. CBG: Recent Labs  Lab 01/19/21 0002 01/19/21 0755  GLUCAP 267* 283*   D-Dimer: No results for input(s): DDIMER in the last 72 hours. Hgb A1c: No results for input(s): HGBA1C in the last 72 hours. Lipid Profile: No results for input(s): CHOL, HDL, LDLCALC, TRIG, CHOLHDL, LDLDIRECT in the last 72 hours. Thyroid function studies: No results for input(s): TSH, T4TOTAL, T3FREE, THYROIDAB in the last 72 hours.  Invalid input(s): FREET3 Anemia work up: No results for input(s): VITAMINB12, FOLATE, FERRITIN, TIBC, IRON, RETICCTPCT in the last 72 hours. Sepsis Labs: Recent Labs  Lab 01/18/21 1948 01/19/21 0406  WBC 8.9 8.8   Microbiology Recent Results (from the past 240 hour(s))  Resp Panel by RT-PCR (Flu A&B, Covid) Nasopharyngeal Swab     Status: None   Collection Time: 01/18/21 10:01 PM   Specimen: Nasopharyngeal Swab; Nasopharyngeal(NP) swabs in vial transport medium  Result Value Ref Range Status   SARS Coronavirus 2 by RT PCR NEGATIVE NEGATIVE Final    Comment: (NOTE) SARS-CoV-2 target nucleic acids are NOT DETECTED.  The SARS-CoV-2 RNA is generally detectable in upper respiratory specimens during the acute phase of infection. The lowest concentration of SARS-CoV-2 viral copies this assay can detect is 138 copies/mL. A negative result does not preclude SARS-Cov-2 infection and should not be used as the sole basis for treatment or other patient management decisions. A negative result may occur with   improper specimen collection/handling, submission of specimen other than nasopharyngeal swab, presence of viral mutation(s) within the areas targeted by this assay, and inadequate number of viral copies(<138 copies/mL). A negative result must be combined  with clinical observations, patient history, and epidemiological information. The expected result is Negative.  Fact Sheet for Patients:  BloggerCourse.com  Fact Sheet for Healthcare Providers:  SeriousBroker.it  This test is no t yet approved or cleared by the [place] FDA and  has been authorized for detection and/or diagnosis of SARS-CoV-2 by FDA under an Emergency Use Authorization (EUA). This EUA will remain  in effect (meaning this test can be used) for the duration of the COVID-19 declaration under Section 564(b)(1) of the Act, 21 U.S.C.section 360bbb-3(b)(1), unless the authorization is terminated  or revoked sooner.       Influenza A by PCR NEGATIVE NEGATIVE Final   Influenza B by PCR NEGATIVE NEGATIVE Final    Comment: (NOTE) The Xpert Xpress SARS-CoV-2/FLU/RSV plus assay is intended as an aid in the diagnosis of influenza from Nasopharyngeal swab specimens and should not be used as a sole basis for treatment. Nasal washings and aspirates are unacceptable for Xpert Xpress SARS-CoV-2/FLU/RSV testing.  Fact Sheet for Patients: BloggerCourse.com  Fact Sheet for Healthcare Providers: SeriousBroker.it  This test is not yet approved or cleared by the [place] FDA and has been authorized for detection and/or diagnosis of SARS-CoV-2 by FDA under an Emergency Use Authorization (EUA). This EUA will remain in effect (meaning this test can be used) for the duration of the COVID-19 declaration under Section 564(b)(1) of the Act, 21 U.S.C. section 360bbb-3(b)(1), unless the authorization is terminated  or revoked.  Performed at [facility], [address] [place], [place] [address]      Medications:    insulin aspart  0-5 Units Subcutaneous QHS   insulin aspart  0-9 Units Subcutaneous TID WC   insulin glargine  30 Units Subcutaneous BID   sodium chloride flush  3 mL Intravenous Q12H   Continuous Infusions:  heparin 1,600 Units/hr (01/19/21 0522)      LOS: 0 days   Marinda Elk  [facility]  01/19/2021, 9:42 AM

## 2021-01-19 NOTE — Progress Notes (Signed)
ANTICOAGULATION CONSULT NOTE -   Pharmacy Consult for heparin Indication: pulmonary embolus  Allergies  Allergen Reactions   Amoxapine And Related Other (See Comments)    Burns skin, hair falls out   Amoxicillin Other (See Comments)    Burns skin and hair falls out   Tramadol Itching   Rocephin [Ceftriaxone] Rash    swelling    Patient Measurements: Height: 5\' 5"  (165.1 cm) Weight: 108.4 kg (239 lb) IBW/kg (Calculated) : 57 Heparin Dosing Weight: 82kg  Vital Signs: Temp: 97.9 F (36.6 C) (06/29 1343) Temp Source: Oral (06/29 1343) BP: 113/77 (06/29 1343) Pulse Rate: 83 (06/29 1343)  Labs: Recent Labs    01/18/21 1948 01/18/21 2158 01/19/21 0406 01/19/21 1120  HGB 15.3*  --  14.5  --   HCT 47.9*  --  46.6*  --   PLT 241  --  229  --   APTT  --  <20*  --   --   LABPROT  --  13.7  --   --   INR  --  1.1  --   --   HEPARINUNFRC  --   --  0.26* 0.20*  CREATININE 1.02*  --  0.81  --     Estimated Creatinine Clearance: 113.1 mL/min (by C-G formula based on SCr of 0.81 mg/dL).  Medical History: Past Medical History:  Diagnosis Date   Asthma    Diabetes mellitus without complication (HCC)    PE (pulmonary thromboembolism) (HCC) 2016   Assessment: 40 yo female presents with SOB x 2 weeks and right lower leg pain.  Pt has a history of PE in 2016.  No current AC noted.  Pharmacy consulted to dose heparin for PE  01/19/2021 0400  HL 0.26 subtherapeutic on 1400 units/hr > rate incr to 1600 units/hr CBC WNL No bleeding noted per RN 1130 HL decreased to 0.2 units/ml despite rate increase, no interruptions in IV noted  Goal of Therapy:  Heparin level 0.3-0.7 units/ml Monitor platelets by anticoagulation protocol: Yes   Plan:  Heparin bolus of 2500 units, increase Heparin rate to 1900 units/hr Heparin level in 6 hours Daily CBC, daily Heparin level at steady state Await transition to oral anti-coagulation  01/21/2021 PharmD 01/19/2021, 2:34 PM

## 2021-01-19 NOTE — Progress Notes (Signed)
Received pt from ED  Pt remains with pain but states she did get relief with pain medication given in the ED.  She also verbalizes needing benadryl prior to pain medication d/t itching.  Will pass down in nurse to nurse report.

## 2021-01-19 NOTE — Progress Notes (Signed)
Pharmacy Brief Note - Evening Anticoagulation Follow Up:  Pt is a 39 yoF on heparin drip for PE. For full history/details, see note by Perlie Gold, PharmD from earlier today.   Assessment: HL = 0.54 is therapeutic on heparin infusion of 1900 units/hr Confirmed with RN that heparin infusing at correct rate. No signs of bleeding.   Goal: HL 0.3 - 0.7  Plan:  Continue heparin infusion at current rate of 1900 units/hr Check confirmatory 6 hour HL HL, CBC daily while on heparin infusion Monitor for signs of bleeding.   Cindi Carbon, PharmD 01/19/21 9:19 PM

## 2021-01-20 DIAGNOSIS — I2692 Saddle embolus of pulmonary artery without acute cor pulmonale: Secondary | ICD-10-CM

## 2021-01-20 LAB — CBC
HCT: 43.4 % (ref 36.0–46.0)
Hemoglobin: 13.8 g/dL (ref 12.0–15.0)
MCH: 24 pg — ABNORMAL LOW (ref 26.0–34.0)
MCHC: 31.8 g/dL (ref 30.0–36.0)
MCV: 75.5 fL — ABNORMAL LOW (ref 80.0–100.0)
Platelets: 218 10*3/uL (ref 150–400)
RBC: 5.75 MIL/uL — ABNORMAL HIGH (ref 3.87–5.11)
RDW: 13.8 % (ref 11.5–15.5)
WBC: 8.9 10*3/uL (ref 4.0–10.5)
nRBC: 0 % (ref 0.0–0.2)

## 2021-01-20 LAB — HEMOGLOBIN A1C
Hgb A1c MFr Bld: 12.9 % — ABNORMAL HIGH (ref 4.8–5.6)
Mean Plasma Glucose: 324 mg/dL

## 2021-01-20 LAB — LUPUS ANTICOAGULANT PANEL
DRVVT: 43.7 s (ref 0.0–47.0)
PTT Lupus Anticoagulant: 46 s (ref 0.0–51.9)

## 2021-01-20 LAB — BETA-2-GLYCOPROTEIN I ABS, IGG/M/A
Beta-2 Glyco I IgG: <9 GPI IgG units (ref 0–20)
Beta-2-Glycoprotein I IgA: 40 GPI IgA units — ABNORMAL HIGH (ref 0–25)
Beta-2-Glycoprotein I IgM: 29 GPI IgM units (ref 0–32)

## 2021-01-20 LAB — GLUCOSE, CAPILLARY
Glucose-Capillary: 181 mg/dL — ABNORMAL HIGH (ref 70–99)
Glucose-Capillary: 241 mg/dL — ABNORMAL HIGH (ref 70–99)
Glucose-Capillary: 193 mg/dL — ABNORMAL HIGH (ref 70–99)
Glucose-Capillary: 179 mg/dL — ABNORMAL HIGH (ref 70–99)

## 2021-01-20 LAB — PROTEIN S, TOTAL: Protein S Ag, Total: 105 % (ref 60–150)

## 2021-01-20 LAB — PROTEIN S ACTIVITY: Protein S Activity: 74 % (ref 63–140)

## 2021-01-20 LAB — HEPARIN LEVEL (UNFRACTIONATED): Heparin Unfractionated: 0.67 IU/mL (ref 0.30–0.70)

## 2021-01-20 LAB — CARDIOLIPIN ANTIBODIES, IGG, IGM, IGA
Anticardiolipin IgA: <9 APL U/mL (ref 0–11)
Anticardiolipin IgG: <9 GPL U/mL (ref 0–14)
Anticardiolipin IgM: 20 MPL U/mL — ABNORMAL HIGH (ref 0–12)

## 2021-01-20 LAB — PROTEIN C ACTIVITY: Protein C Activity: 115 % (ref 73–180)

## 2021-01-20 LAB — ANA W/REFLEX IF POSITIVE: Anti Nuclear Antibody (ANA): NEGATIVE

## 2021-01-20 LAB — HOMOCYSTEINE: Homocysteine: 7.6 umol/L (ref 0.0–14.5)

## 2021-01-20 MED ORDER — TRAMADOL HCL 50 MG PO TABS
50.0000 mg | ORAL_TABLET | Freq: Four times a day (QID) | ORAL | Status: DC | PRN
  Administered 2021-01-20 – 2021-01-21 (×2): 50 mg via ORAL
  Filled 2021-01-20 (×2): qty 1

## 2021-01-20 MED ORDER — RIVAROXABAN 20 MG PO TABS: 20.0000 mg | ORAL_TABLET | Freq: Every day | ORAL | Status: DC

## 2021-01-20 MED ORDER — RIVAROXABAN (XARELTO) EDUCATION KIT FOR DVT/PE PATIENTS
PACK | Freq: Once | Status: AC
  Filled 2021-01-20: qty 1

## 2021-01-20 MED ORDER — DIPHENHYDRAMINE HCL 25 MG PO CAPS
25.0000 mg | ORAL_CAPSULE | Freq: Four times a day (QID) | ORAL | Status: DC | PRN
  Administered 2021-01-20 – 2021-01-22 (×5): 25 mg via ORAL
  Filled 2021-01-20 (×5): qty 1

## 2021-01-20 MED ORDER — POLYETHYLENE GLYCOL 3350 17 G PO PACK
17.0000 g | PACK | Freq: Two times a day (BID) | ORAL | Status: AC
  Administered 2021-01-20 (×2): 17 g via ORAL
  Filled 2021-01-20 (×4): qty 1

## 2021-01-20 MED ORDER — RIVAROXABAN 15 MG PO TABS
15.0000 mg | ORAL_TABLET | Freq: Two times a day (BID) | ORAL | Status: DC
  Administered 2021-01-20 – 2021-01-21 (×2): 15 mg via ORAL
  Filled 2021-01-20 (×2): qty 1

## 2021-01-20 NOTE — Progress Notes (Signed)
[facility] PROGRESS NOTE    Progress Note  Anjulie Dipierro  ZOX:[id number] DOB: 1980-09-18 DOA: 01/18/2021 PCP: Pcp, No     Brief Narrative:   Shannon Oneal is an 40 y.o. female past medical history diabetes mellitus type 2, history of PE in 2016 completed 6 months of anticoagulation presents to the ED for evaluation of right leg swelling and dyspnea, CTA of the chest was positive for bilateral pulmonary emboli.  Lower extremity Doppler was positive for DVT   Assessment/Plan:   Acute pulmonary embolus (HCC)/Acute deep vein thrombosis (DVT) of right lower extremity (HCC): 2D echo showed an EF of 55% with no regional wall motion abnormalities. She has remained in room air vitals are stable. Hypercoagulable panel has been sent.  ANA is pending. Relates her narcotics are causing a lot of itching switch her to oral tramadol started on Benadryl. Will need to follow-up with hematology as an outpatient.  Type 2 diabetes mellitus with hyperglycemia (HCC) Holding oral hypoglycemic agent, started on long-acting insulin plus sliding scale his blood glucose slowly improving.   DVT prophylaxis: heparin Family Communication:none Status is: Observation  The patient will require care spanning > 2 midnights and should be moved to inpatient because: Hemodynamically unstable  Dispo: The patient is from: Home              Anticipated d/c is to: Home              Patient currently is not medically stable to d/c.   Difficult to place patient No        Code Status:     Code Status Orders  (From admission, onward)           Start     Ordered   01/18/21 2319  Full code  Continuous        01/18/21 2320           Code Status History     This patient has a current code status but no historical code status.         IV Access:   Peripheral IV   Procedures and diagnostic studies:   CT Angio Chest PE W and/or Wo Contrast  Result Date: 01/18/2021 CLINICAL DATA:  PE  suspected, low/intermediate prob, positive D-dimer dvt positive EXAM: CT ANGIOGRAPHY CHEST WITH CONTRAST TECHNIQUE: Multidetector CT imaging of the chest was performed using the standard protocol during bolus administration of intravenous contrast. Multiplanar CT image reconstructions and MIPs were obtained to evaluate the vascular anatomy. CONTRAST:  OMNIPAQUE IOHEXOL 350 MG/ML SOLN COMPARISON:  None. FINDINGS: Cardiovascular: There are bilateral central pulmonary emboli in the distal right and left branch pulmonary arteries. Emboli extend into into the lobar, segmental, and subsegmental branches of the lower lobes bilaterally. There is similar extension in the the upper lobes but to a lesser degree than the lower lobes. RV/LV ratio is approximately 1.02. Mediastinum/Nodes: No enlarged mediastinal, hilar, or axillary lymph nodes. Thyroid gland, trachea, and esophagus demonstrate no significant findings. Lungs/Pleura: Lungs are clear. No pleural effusion or pneumothorax. Upper Abdomen: No acute abnormality. Musculoskeletal: No chest wall abnormality. No acute or significant osseous findings. Review of the MIP images confirms the above findings. IMPRESSION: Bilateral central pulmonary emboli in the distal right and left branch pulmonary arteries, extending into the lobar, segmental, and subsegmental branches. Mildly elevated RV/LV ratio suggesting possible right heart strain. Correlate with echocardiography. Positive for acute PE with CT evidence of right heart strain (RV/LV Ratio = 1.02) consistent with  at least submassive (intermediate risk) PE. The presence of right heart strain has been associated with an increased risk of morbidity and mortality. Please refer to the "PE Focused" order set in EPIC. Critical Value/emergent results were called by telephone at the time of interpretation on 01/18/2021 at 9:38 pm to provider [facility] , who verbally acknowledged these results. Electronically Signed   By:  Caprice Renshaw   On: 01/18/2021 21:41   DG Knee Complete 4 Views Left  Result Date: 01/18/2021 CLINICAL DATA:  Knee pain EXAM: LEFT KNEE - COMPLETE 4+ VIEW COMPARISON:  None. FINDINGS: No fracture or malalignment. Mild to moderate medial joint space degenerative change. No sizable knee effusion IMPRESSION: Degenerative changes.  No acute osseous abnormality Electronically Signed   By: Jasmine Pang M.D.   On: 01/18/2021 18:24   DG Knee Complete 4 Views Right  Result Date: 01/18/2021 CLINICAL DATA:  Knee pain EXAM: RIGHT KNEE - COMPLETE 4+ VIEW COMPARISON:  03/26/2016 FINDINGS: No fracture or malalignment. Mild medial and patellofemoral degenerative changes. No substantial knee effusion IMPRESSION: Mild arthritis.  No acute osseous abnormality Electronically Signed   By: Jasmine Pang M.D.   On: 01/18/2021 18:24   ECHOCARDIOGRAM COMPLETE  Result Date: 01/19/2021    ECHOCARDIOGRAM REPORT   Patient Name:   Shannon Oneal Date of Exam: 01/19/2021 Medical Rec #:  [id number]   Height:       65.0 in Accession #:    [id number]  Weight:       239.0 lb Date of Birth:  1981-03-17  BSA:          2.134 m Patient Age:    39 years    BP:           128/76 mmHg Patient Gender: F           HR:           88 bpm. Exam Location:  Inpatient Procedure: 2D Echo, 3D Echo, Cardiac Doppler and Color Doppler Indications:    I26.02 Pulmonary embolus  History:        Patient has no prior history of Echocardiogram examinations.                 Abnormal ECG, Arrythmias:Tachycardia, Signs/Symptoms:Shortness                 of Breath and Dyspnea; Risk Factors:Diabetes.  Sonographer:    Sheralyn Boatman RDCS Referring Phys: [id number] Floreen Comber PATEL  Sonographer Comments: Image acquisition challenging due to patient body habitus. IMPRESSIONS  1. Left ventricular ejection fraction, by estimation, is 55 to 60%. Left ventricular ejection fraction by 3D volume is 58 %. The left ventricle has normal function. The left ventricle has no regional wall motion  abnormalities. There is mild left ventricular hypertrophy. Left ventricular diastolic parameters were normal.  2. Right ventricular systolic function is normal. The right ventricular size is normal. Tricuspid regurgitation signal is inadequate for assessing PA pressure.  3. The mitral valve is normal in structure. Trivial mitral valve regurgitation. No evidence of mitral stenosis.  4. The aortic valve is normal in structure. Aortic valve regurgitation is not visualized. No aortic stenosis is present.  5. The inferior vena cava is normal in size with greater than 50% respiratory variability, suggesting right atrial pressure of 3 mmHg. FINDINGS  Left Ventricle: Left ventricular ejection fraction, by estimation, is 55 to 60%. Left ventricular ejection fraction by 3D volume is 58 %. The left ventricle has normal function. The left  ventricle has no regional wall motion abnormalities. The left ventricular internal cavity size was normal in size. There is mild left ventricular hypertrophy. Left ventricular diastolic parameters were normal. Right Ventricle: The right ventricular size is normal. No increase in right ventricular wall thickness. Right ventricular systolic function is normal. Tricuspid regurgitation signal is inadequate for assessing PA pressure. Left Atrium: Left atrial size was normal in size. Right Atrium: Right atrial size was normal in size. Pericardium: There is no evidence of pericardial effusion. Presence of pericardial fat pad. Mitral Valve: The mitral valve is normal in structure. Trivial mitral valve regurgitation. No evidence of mitral valve stenosis. Tricuspid Valve: The tricuspid valve is normal in structure. Tricuspid valve regurgitation is trivial. No evidence of tricuspid stenosis. Aortic Valve: The aortic valve is normal in structure. Aortic valve regurgitation is not visualized. No aortic stenosis is present. Pulmonic Valve: The pulmonic valve was normal in structure. Pulmonic valve  regurgitation is not visualized. No evidence of pulmonic stenosis. Aorta: The aortic root is normal in size and structure. Venous: The inferior vena cava is normal in size with greater than 50% respiratory variability, suggesting right atrial pressure of 3 mmHg. IAS/Shunts: There is redundancy of the interatrial septum. No atrial level shunt detected by color flow Doppler.  LEFT VENTRICLE PLAX 2D LVIDd:         3.23 cm         Diastology LVIDs:         2.30 cm         LV e' medial:    7.07 cm/s LV PW:         1.20 cm         LV E/e' medial:  10.6 LV IVS:        1.23 cm         LV e' lateral:   10.00 cm/s LVOT diam:     2.00 cm         LV E/e' lateral: 7.5 LV SV:         61 LV SV Index:   28 LVOT Area:     3.14 cm        3D Volume EF                                LV 3D EF:    Left                                             ventricular LV Volumes (MOD)                            ejection LV vol d, MOD    48.6 ml                    fraction by A2C:                                        3D volume LV vol d, MOD    61.2 ml                    is 58 %. A4C: LV vol s, MOD  18.4 ml A2C:                           3D Volume EF: LV vol s, MOD    22.8 ml       3D EF:        58 % A4C: LV SV MOD A2C:   30.2 ml LV SV MOD A4C:   61.2 ml LV SV MOD BP:    34.0 ml RIGHT VENTRICLE             IVC RV S prime:     13.60 cm/s  IVC diam: 1.30 cm TAPSE (M-mode): 2.2 cm LEFT ATRIUM             Index       RIGHT ATRIUM          Index LA diam:        3.40 cm 1.59 cm/m  RA Area:     9.30 cm LA Vol (A2C):   20.3 ml 9.51 ml/m  RA Volume:   17.20 ml 8.06 ml/m LA Vol (A4C):   24.3 ml 11.39 ml/m LA Biplane Vol: 22.7 ml 10.64 ml/m  AORTIC VALVE LVOT Vmax:   128.00 cm/s LVOT Vmean:  80.200 cm/s LVOT VTI:    0.193 m  AORTA Ao Root diam: 2.70 cm Ao Asc diam:  2.70 cm MITRAL VALVE MV Area (PHT): 3.88 cm    SHUNTS MV Decel Time: 196 msec    Systemic VTI:  0.19 m MV E velocity: 74.80 cm/s  Systemic Diam: 2.00 cm MV A velocity: 73.65 cm/s MV E/A  ratio:  1.02 Weston Brass MD Electronically signed by Weston Brass MD Signature Date/Time: 01/19/2021/5:39:54 PM    Final    VAS [place] LOWER EXTREMITY VENOUS (DVT) (ONLY [facility] & [facility] 7a-7p)  Result Date: 01/19/2021  Lower Venous DVT Study Patient Name:  DONATELLA WALSKI  Date of Exam:   01/18/2021 Medical Rec #: [id number]    Accession #:    [id number] Date of Birth: Sep 12, 1980   Patient Gender: F Patient Age:   45Y Exam Location:  [facility] Procedure:      VAS [place] LOWER EXTREMITY VENOUS (DVT) Referring Phys: [id number] PETER C MESSICK --------------------------------------------------------------------------------  Indications: SOB, and RLE - pain and numbness.  Risk Factors: PE 2016. Comparison Study: No previous exams Performing Technologist: Jody Hill RVT, RDMS  Examination Guidelines: A complete evaluation includes B-mode imaging, spectral Doppler, color Doppler, and power Doppler as needed of all accessible portions of each vessel. Bilateral testing is considered an integral part of a complete examination. Limited examinations for reoccurring indications may be performed as noted. The reflux portion of the exam is performed with the patient in reverse Trendelenburg.  +---------+---------------+---------+-----------+----------+-------------------+ RIGHT    CompressibilityPhasicitySpontaneityPropertiesThrombus Aging      +---------+---------------+---------+-----------+----------+-------------------+ CFV      Full           Yes      Yes                                      +---------+---------------+---------+-----------+----------+-------------------+ SFJ      Full                                                             +---------+---------------+---------+-----------+----------+-------------------+  FV Prox  Full           Yes      Yes                                      +---------+---------------+---------+-----------+----------+-------------------+ FV Mid   None            No       No                   Acute               +---------+---------------+---------+-----------+----------+-------------------+ FV DistalNone           No       No                   Acute               +---------+---------------+---------+-----------+----------+-------------------+ PFV      Full                                                             +---------+---------------+---------+-----------+----------+-------------------+ POP      None           No       No                   Acute               +---------+---------------+---------+-----------+----------+-------------------+ PTV      None           No       No                   Acute               +---------+---------------+---------+-----------+----------+-------------------+ PERO                                                  Not visualized on                                                         this exam           +---------+---------------+---------+-----------+----------+-------------------+ Gastroc  None           No       No                   Acute               +---------+---------------+---------+-----------+----------+-------------------+ SSV      None           No       No                   Acute               +---------+---------------+---------+-----------+----------+-------------------+   +---------+---------------+---------+-----------+----------+--------------+ LEFT  CompressibilityPhasicitySpontaneityPropertiesThrombus Aging +---------+---------------+---------+-----------+----------+--------------+ CFV      Full           Yes      Yes                                 +---------+---------------+---------+-----------+----------+--------------+ SFJ      Full                                                        +---------+---------------+---------+-----------+----------+--------------+ FV Prox  Full           Yes      Yes                                  +---------+---------------+---------+-----------+----------+--------------+ FV Mid   Full           Yes      Yes                                 +---------+---------------+---------+-----------+----------+--------------+ FV DistalFull           Yes      Yes                                 +---------+---------------+---------+-----------+----------+--------------+ PFV      Full                                                        +---------+---------------+---------+-----------+----------+--------------+ POP      Full           Yes      Yes                                 +---------+---------------+---------+-----------+----------+--------------+ PTV      Full                                                        +---------+---------------+---------+-----------+----------+--------------+ PERO     Full                                                        +---------+---------------+---------+-----------+----------+--------------+     Summary: BILATERAL: -No evidence of popliteal cyst, bilaterally. RIGHT: - Findings consistent with acute deep vein thrombosis involving the right femoral vein, right popliteal vein, right posterior tibial veins, and right gastrocnemius veins. - Findings consistent with acute superficial vein thrombosis involving the right small saphenous vein.  LEFT: - There is no evidence of deep vein thrombosis in the lower extremity. - There is no  evidence of superficial venous thrombosis.  - Ultrasound characteristics of enlarged lymph nodes noted in the groin.  *See table(s) above for measurements and observations. Electronically signed by Waverly Ferrari MD on 01/19/2021 at 8:15:17 AM.    Final      Medical Consultants:   None.   Subjective:    Saya Mccoll relates her breathing is better now she is complaining of right lower extremity pain and itching.  Objective:    Vitals:   01/19/21 1733 01/19/21 2049 01/20/21 0028  01/20/21 0457  BP: 122/71 119/73 117/79 121/78  Pulse: 86 81 88 82  Resp: Temp: 98.4 F (36.9 C) 98 F (36.7 C) 98.1 F (36.7 C) 98 F (36.7 C)  TempSrc: Oral     SpO2: 98% 100% 98% 99%  Weight:      Height:       SpO2: 99 %   Intake/Output Summary (Last 24 hours) at 01/20/2021 0956 Last data filed at 01/20/2021 0600 Gross per 24 hour  Intake 271.67 ml  Output --  Net 271.67 ml   Filed Weights   01/18/21 1705  Weight: 108.4 kg    Exam: General exam: In no acute distress. Respiratory system: Good air movement and clear to auscultation. Cardiovascular system: S1 & S2 heard, RRR. No JVD. Gastrointestinal system: Abdomen is nondistended, soft and nontender.  Extremities: Right knee and is not swollen and not tender to touch Skin: No rashes, lesions or ulcers Psychiatry: Judgement and insight appear normal. Mood & affect appropriate.   Data Reviewed:    Labs: Basic Metabolic Panel: Recent Labs  Lab 01/18/21 1948 01/19/21 0406  NA 133* 134*  K 4.2 4.0  CL 102 102  CO2 25 24  GLUCOSE 358* 312*  BUN 11 11  CREATININE 1.02* 0.81  CALCIUM 9.2 9.3    GFR Estimated Creatinine Clearance: 113.1 mL/min (by C-G formula based on SCr of 0.81 mg/dL). Liver Function Tests: No results for input(s): AST, ALT, ALKPHOS, BILITOT, PROT, ALBUMIN in the last 168 hours. No results for input(s): LIPASE, AMYLASE in the last 168 hours. No results for input(s): AMMONIA in the last 168 hours. Coagulation profile Recent Labs  Lab 01/18/21 2158  INR 1.1    COVID-19 Labs  No results for input(s): DDIMER, FERRITIN, LDH, CRP in the last 72 hours.  Lab Results  Component Value Date   SARSCOV2NAA NEGATIVE 01/18/2021    CBC: Recent Labs  Lab 01/18/21 1948 01/19/21 0406 01/20/21 0309  WBC 8.9 8.8 8.9  NEUTROABS 4.3  --   --   HGB 15.3* 14.5 13.8  HCT 47.9* 46.6* 43.4  MCV 74.3* 75.2* 75.5*  PLT 241 229 218    Cardiac Enzymes: No results for input(s):  CKTOTAL, CKMB, CKMBINDEX, TROPONINI in the last 168 hours. BNP (last 3 results) No results for input(s): PROBNP in the last 8760 hours. CBG: Recent Labs  Lab 01/19/21 1226 01/19/21 1348 01/19/21 1717 01/19/21 2050 01/20/21 0736  GLUCAP 242* 276* 154* 277* 181*    D-Dimer: No results for input(s): DDIMER in the last 72 hours. Hgb A1c: No results for input(s): HGBA1C in the last 72 hours. Lipid Profile: No results for input(s): CHOL, HDL, LDLCALC, TRIG, CHOLHDL, LDLDIRECT in the last 72 hours. Thyroid function studies: No results for input(s): TSH, T4TOTAL, T3FREE, THYROIDAB in the last 72 hours.  Invalid input(s): FREET3 Anemia work up: No results for input(s): VITAMINB12, FOLATE, FERRITIN, TIBC, IRON, RETICCTPCT in the last 72 hours. Sepsis Labs:  Recent Labs  Lab 01/18/21 1948 01/19/21 0406 01/20/21 0309  WBC 8.9 8.8 8.9    Microbiology Recent Results (from the past 240 hour(s))  Resp Panel by RT-PCR (Flu A&B, Covid) Nasopharyngeal Swab     Status: None   Collection Time: 01/18/21 10:01 PM   Specimen: Nasopharyngeal Swab; Nasopharyngeal(NP) swabs in vial transport medium  Result Value Ref Range Status   SARS Coronavirus 2 by RT PCR NEGATIVE NEGATIVE Final    Comment: (NOTE) SARS-CoV-2 target nucleic acids are NOT DETECTED.  The SARS-CoV-2 RNA is generally detectable in upper respiratory specimens during the acute phase of infection. The lowest concentration of SARS-CoV-2 viral copies this assay can detect is 138 copies/mL. A negative result does not preclude SARS-Cov-2 infection and should not be used as the sole basis for treatment or other patient management decisions. A negative result may occur with  improper specimen collection/handling, submission of specimen other than nasopharyngeal swab, presence of viral mutation(s) within the areas targeted by this assay, and inadequate number of viral copies(<138 copies/mL). A negative result must be combined  with clinical observations, patient history, and epidemiological information. The expected result is Negative.  Fact Sheet for Patients:  BloggerCourse.com  Fact Sheet for Healthcare Providers:  SeriousBroker.it  This test is no t yet approved or cleared by the [place] FDA and  has been authorized for detection and/or diagnosis of SARS-CoV-2 by FDA under an Emergency Use Authorization (EUA). This EUA will remain  in effect (meaning this test can be used) for the duration of the COVID-19 declaration under Section 564(b)(1) of the Act, 21 U.S.C.section 360bbb-3(b)(1), unless the authorization is terminated  or revoked sooner.       Influenza A by PCR NEGATIVE NEGATIVE Final   Influenza B by PCR NEGATIVE NEGATIVE Final    Comment: (NOTE) The Xpert Xpress SARS-CoV-2/FLU/RSV plus assay is intended as an aid in the diagnosis of influenza from Nasopharyngeal swab specimens and should not be used as a sole basis for treatment. Nasal washings and aspirates are unacceptable for Xpert Xpress SARS-CoV-2/FLU/RSV testing.  Fact Sheet for Patients: BloggerCourse.com  Fact Sheet for Healthcare Providers: SeriousBroker.it  This test is not yet approved or cleared by the [place] FDA and has been authorized for detection and/or diagnosis of SARS-CoV-2 by FDA under an Emergency Use Authorization (EUA). This EUA will remain in effect (meaning this test can be used) for the duration of the COVID-19 declaration under Section 564(b)(1) of the Act, 21 U.S.C. section 360bbb-3(b)(1), unless the authorization is terminated or revoked.  Performed at [facility], [address] [place], [place] [address]      Medications:    insulin aspart  0-5 Units Subcutaneous QHS   insulin aspart  0-9 Units Subcutaneous TID WC   insulin glargine  30 Units Subcutaneous BID    sodium chloride flush  3 mL Intravenous Q12H   Continuous Infusions:  heparin 1,900 Units/hr (01/20/21 0432)      LOS: 1 day   Marinda Elk  [facility]  01/20/2021, 9:56 AM

## 2021-01-20 NOTE — Progress Notes (Signed)
ANTICOAGULATION CONSULT NOTE -   Pharmacy Consult for Xarelto Indication: pulmonary embolus, DVT  Allergies  Allergen Reactions   Amoxapine And Related Other (See Comments)    Burns skin, hair falls out   Amoxicillin Other (See Comments)    Burns skin and hair falls out   Tramadol Itching   Rocephin [Ceftriaxone] Rash    swelling    Patient Measurements: Height: 5\' 5"  (165.1 cm) Weight: 108.4 kg (239 lb) IBW/kg (Calculated) : 57 Heparin Dosing Weight: 82kg  Vital Signs: Temp: 98 F (36.7 C) (06/30 0457) BP: 121/78 (06/30 0457) Pulse Rate: 82 (06/30 0457)  Labs: Recent Labs    01/18/21 1948 01/18/21 1948 01/18/21 2158 01/19/21 0406 01/19/21 1120 01/19/21 2036 01/20/21 0309  HGB 15.3*  --   --  14.5  --   --  13.8  HCT 47.9*  --   --  46.6*  --   --  43.4  PLT 241  --   --  229  --   --  218  APTT  --   --  <20*  --   --   --   --   LABPROT  --   --  13.7  --   --   --   --   INR  --   --  1.1  --   --   --   --   HEPARINUNFRC  --    < >  --  0.26* 0.20* 0.54 0.67  CREATININE 1.02*  --   --  0.81  --   --   --    < > = values in this interval not displayed.    Estimated Creatinine Clearance: 113.1 mL/min (by C-G formula based on SCr of 0.81 mg/dL).  Medical History: Past Medical History:  Diagnosis Date   Asthma    Diabetes mellitus without complication ([place])    PE (pulmonary thromboembolism) ([place]) 2016   Assessment: 40 yo female presents with SOB x 2 weeks and right lower leg pain.  Pt has a history of PE in 2016.  No current AC noted.  Pharmacy consulted to dose heparin for PE  01/20/2021  HL 0.67 therapeutic on 1900 units/hr CBC WNL No bleeding noted Noted Xarelto discharge kit ordered by MD Transition to Xarelto today 6/30  Goal of Therapy:  Heparin level 0.3-0.7 units/ml Monitor platelets by anticoagulation protocol: Yes   Plan:  Discontinue Heparin at 1700 Begin Xarelto 15mg  bid with meals at 1700 today x 3 weeks, followed by 20mg  daily  with supper Xarelto Education  Minda Ditto PharmD 01/20/2021, 1:56 PM

## 2021-01-20 NOTE — Progress Notes (Signed)
ANTICOAGULATION CONSULT NOTE -   Pharmacy Consult for heparin Indication: pulmonary embolus  Allergies  Allergen Reactions   Amoxapine And Related Other (See Comments)    Burns skin, hair falls out   Amoxicillin Other (See Comments)    Burns skin and hair falls out   Tramadol Itching   Rocephin [Ceftriaxone] Rash    swelling    Patient Measurements: Height: 5\' 5"  (165.1 cm) Weight: 108.4 kg (239 lb) IBW/kg (Calculated) : 57 Heparin Dosing Weight: 82kg  Vital Signs: Temp: 98.1 F (36.7 C) (06/30 0028) Temp Source: Oral (06/29 1733) BP: 117/79 (06/30 0028) Pulse Rate: 88 (06/30 0028)  Labs: Recent Labs    01/18/21 1948 01/18/21 1948 01/18/21 2158 01/19/21 0406 01/19/21 1120 01/19/21 2036 01/20/21 0309  HGB 15.3*  --   --  14.5  --   --  13.8  HCT 47.9*  --   --  46.6*  --   --  43.4  PLT 241  --   --  229  --   --  218  APTT  --   --  <20*  --   --   --   --   LABPROT  --   --  13.7  --   --   --   --   INR  --   --  1.1  --   --   --   --   HEPARINUNFRC  --    < >  --  0.26* 0.20* 0.54 0.67  CREATININE 1.02*  --   --  0.81  --   --   --    < > = values in this interval not displayed.    Estimated Creatinine Clearance: 113.1 mL/min (by C-G formula based on SCr of 0.81 mg/dL).  Medical History: Past Medical History:  Diagnosis Date   Asthma    Diabetes mellitus without complication (HCC)    PE (pulmonary thromboembolism) (HCC) 2016   Assessment: 40 yo female presents with SOB x 2 weeks and right lower leg pain.  Pt has a history of PE in 2016.  No current AC noted.  Pharmacy consulted to dose heparin for PE  01/20/2021  HL 0.67 therapeutic on 1900 units/hr CBC WNL No bleeding noted  Goal of Therapy:  Heparin level 0.3-0.7 units/ml Monitor platelets by anticoagulation protocol: Yes   Plan:  Continue heparin drip at 1900 units/hr Daily CBC, daily Heparin  Await transition to oral anti-coagulation  01/22/2021 RPh 01/20/2021, 3:44 AM

## 2021-01-20 NOTE — TOC Benefit Eligibility Note (Signed)
Transition of Care [facility]) Benefit Eligibility Note    Patient Details  Name: Shannon Oneal MRN: [id number] Date of Birth: 1980-08-13   Medication/Dose: Xarelto 2.5 mg 2x day, 22m 2x day, 134m1 xday for 30 days  Covered?: Yes  Tier: 3 Drug  Prescription [facility]age [facility] [place]Company/Phone Number:: [phone]: [facility]  Deductible: Met  Additional Notes: all doses [facility] Number: 01/20/2021, 1:56 PM

## 2021-01-21 DIAGNOSIS — I82411 Acute embolism and thrombosis of right femoral vein: Secondary | ICD-10-CM

## 2021-01-21 DIAGNOSIS — I2699 Other pulmonary embolism without acute cor pulmonale: Principal | ICD-10-CM

## 2021-01-21 LAB — PROTEIN C, TOTAL: Protein C, Total: 98 % (ref 60–150)

## 2021-01-21 LAB — GLUCOSE, CAPILLARY
Glucose-Capillary: 189 mg/dL — ABNORMAL HIGH (ref 70–99)
Glucose-Capillary: 230 mg/dL — ABNORMAL HIGH (ref 70–99)
Glucose-Capillary: 171 mg/dL — ABNORMAL HIGH (ref 70–99)
Glucose-Capillary: 201 mg/dL — ABNORMAL HIGH (ref 70–99)

## 2021-01-21 MED ORDER — OXYCODONE HCL 5 MG PO TABS: 5.0000 mg | ORAL_TABLET | ORAL | Status: DC | PRN

## 2021-01-21 MED ORDER — OXYCODONE HCL 5 MG PO TABS
5.0000 mg | ORAL_TABLET | ORAL | Status: DC | PRN
  Administered 2021-01-21: 5 mg via ORAL
  Administered 2021-01-21 – 2021-01-22 (×2): 10 mg via ORAL
  Filled 2021-01-21 (×2): qty 2
  Filled 2021-01-21: qty 1

## 2021-01-21 MED ORDER — ENOXAPARIN SODIUM 120 MG/0.8ML IJ SOSY
110.0000 mg | PREFILLED_SYRINGE | Freq: Two times a day (BID) | INTRAMUSCULAR | Status: DC
  Administered 2021-01-21 – 2021-01-22 (×2): 110 mg via SUBCUTANEOUS
  Filled 2021-01-21 (×2): qty 0.74

## 2021-01-21 MED ORDER — FLUCONAZOLE 150 MG PO TABS
150.0000 mg | ORAL_TABLET | Freq: Once | ORAL | Status: AC
  Administered 2021-01-21: 150 mg via ORAL
  Filled 2021-01-21: qty 1

## 2021-01-21 NOTE — TOC Benefit Eligibility Note (Signed)
Transition of Care [facility]) Benefit Eligibility Note    Patient Details  Name: Shannon Oneal MRN: [id number] Date of Birth: [birth year]   Medication/Dose: Eliquis 10mg  x 7 days the 5mg  2 x day , Enoxaparin on formulay 110mg  q 12 hours  Covered?: Yes  Tier: 3 Drug  Prescription Coverage Preferred Pharmacy: [facility] and [employer] Officer with Person/Company/Phone Number:: [employer] [place]  Co-Pay: Eliquis 10mg  x 7 days the 5mg  2 x day is $266.44,  Lovenox not covered Enoxaparin on formulay 110mg  q 12 hours is $72.88  Prior Approval: No  Deductible: Met  Additional Notes: all doses were estimated at copay $490 at [facility] and $470.00 at [facility] [place] Phone Number: 01/21/2021, 9:50 AM

## 2021-01-21 NOTE — Consult Note (Signed)
Referring Physician: [facility]  Patient name: Shannon Oneal MRN: [id number] DOB: February 14, 1981 Sex: female  REASON FOR CONSULT: Right leg DVT  HPI: Shannon Oneal is a 40 y.o. female, admitted 3 days ago with right leg swelling.  She was noted at the time of admission to the [facility] [address], ER to have a DVT in her right leg as well as a pulmonary embolus.  She did have a prior pulmonary embolus several years ago and was treated with 6 months of anticoagulation.  On admission she was treated with heparin during this hospital course and is being transitioned over to oral anticoagulation.  Patient states the swelling is about the same as it was 3 days ago.  The pain is no worse.  She is still uncomfortable in the thigh and calf.  Most of the pain is in the thigh.  Hypercoagulable work-up is in progress and she has follow-up scheduled with hematology.   Past Medical History:  Diagnosis Date   Asthma    Diabetes mellitus without complication (HCC)    PE (pulmonary thromboembolism) (HCC) 2016   History reviewed. No pertinent surgical history.  Family History  Problem Relation Age of Onset   Deep vein thrombosis Father     SOCIAL HISTORY: Social History   Socioeconomic History   Marital status: Married    Spouse name: Not on file   Number of children: Not on file   Years of education: Not on file   Highest education level: Not on file  Occupational History   Not on file  Tobacco Use   Smoking status: Never   Smokeless tobacco: Never  Substance and Sexual Activity   Alcohol use: Yes   Drug use: Not on file   Sexual activity: Not on file  Other Topics Concern   Not on file  Social History Narrative   Not on file   Social Determinants of Health   Financial Resource Strain: Not on file  Food Insecurity: Not on file  Transportation Needs: Not on file  Physical Activity: Not on file  Stress: Not on file  Social Connections: Not on file  Intimate Partner Violence: Not on file     Allergies  Allergen Reactions   Amoxapine And Related Other (See Comments)    Burns skin, hair falls out   Amoxicillin Other (See Comments)    Burns skin and hair falls out   Tramadol Itching   Rocephin [Ceftriaxone] Rash    swelling    Current Facility-Administered Medications  Medication Dose Route Frequency Provider Last Rate Last Admin   acetaminophen (TYLENOL) tablet 650 mg  650 mg Oral Q6H PRN Charlsie Quest, MD       Or   acetaminophen (TYLENOL) suppository 650 mg  650 mg Rectal Q6H PRN Darreld Mclean R, MD       diphenhydrAMINE (BENADRYL) capsule 25 mg  25 mg Oral Q6H PRN Marinda Elk, MD   25 mg at 01/21/21 1146   insulin aspart (novoLOG) injection 0-5 Units  0-5 Units Subcutaneous QHS Darreld Mclean R, MD   3 Units at 01/19/21 2200   insulin aspart (novoLOG) injection 0-9 Units  0-9 Units Subcutaneous TID WC Charlsie Quest, MD   2 Units at 01/21/21 0820   insulin glargine (LANTUS) injection 30 Units  30 Units Subcutaneous BID Charlsie Quest, MD   30 Units at 01/21/21 0958   ondansetron (ZOFRAN) tablet 4 mg  4 mg Oral Q6H PRN Charlsie Quest, MD  Or   ondansetron (ZOFRAN) injection 4 mg  4 mg Intravenous Q6H PRN Darreld Mclean R, MD       oxyCODONE (Oxy IR/ROXICODONE) immediate release tablet 5-10 mg  5-10 mg Oral Q4H PRN Noralee Stain, DO   5 mg at 01/21/21 1055   polyethylene glycol (MIRALAX / GLYCOLAX) packet 17 g  17 g Oral BID Marinda Elk, MD   17 g at 01/20/21 2131   Rivaroxaban (XARELTO) tablet 15 mg  15 mg Oral BID WC Otho Bellows, RPH   15 mg at 01/21/21 7588   Followed by   Melene Muller ON 02/11/2021] rivaroxaban (XARELTO) tablet 20 mg  20 mg Oral Q supper Green, Terri L, RPH       senna-docusate (Senokot-S) tablet 1 tablet  1 tablet Oral QHS PRN Charlsie Quest, MD       sodium chloride flush (NS) 0.9 % injection 3 mL  3 mL Intravenous Q12H Darreld Mclean R, MD   3 mL at 01/21/21 0958    ROS:   General:  No weight loss, Fever,  chills  HEENT: No recent headaches, no nasal bleeding, no visual changes, no sore throat  Neurologic: No dizziness, blackouts, seizures. No recent symptoms of stroke or mini- stroke. No recent episodes of slurred speech, or temporary blindness.  Cardiac: No recent episodes of chest pain/pressure, no shortness of breath at rest.  No shortness of breath with exertion.  Denies history of atrial fibrillation or irregular heartbeat  Vascular: No history of rest pain in feet.  No history of claudication.  No history of non-healing ulcer, No history of DVT   Pulmonary: No home oxygen, no productive cough, no hemoptysis,  No asthma or wheezing  Musculoskeletal:  [ ]  Arthritis, [ ]  Low back pain,  [ ]  Joint pain  Hematologic:No history of hypercoagulable state.  No history of easy bleeding.  No history of anemia  Gastrointestinal: No hematochezia or melena,  No gastroesophageal reflux, no trouble swallowing  Urinary: [ ]  chronic Kidney disease, [ ]  on HD - [ ]  MWF or [ ]  TTHS, [ ]  Burning with urination, [ ]  Frequent urination, [ ]  Difficulty urinating;   Skin: No rashes  Psychological: No history of anxiety,  No history of depression   Physical Examination  Vitals:   01/20/21 0457 01/20/21 1508 01/20/21 2035 01/21/21 0621  BP: 121/78 131/90 123/89 124/89  Pulse: 82 93 85   Resp: 17 16 19    Temp: 98 F (36.7 C) 98.7 F (37.1 C) 98.2 F (36.8 C) 98.5 F (36.9 C)  TempSrc:  Oral  Oral  SpO2: 99% 99% 99% 99%  Weight:      Height:        Body mass index is 39.77 kg/m.  General:  Alert and oriented, no acute distress HEENT: Normal Neck: No JVD Cardiac: Regular Rate and Rhythm  Abdomen: Soft, non-tender, non-distended, obese  skin: No rash, no ulcer Extremity Pulses:  2+ radial, brachial, femoral, 1+ dorsalis pedis, posterior tibial pulses bilaterally Musculoskeletal: No deformity right leg extending from just above the knee to the mid calf and some into the foot edema, mild  pain on palpation.  Overall compartments appear soft Neurologic: Upper and lower extremity motor 5/5 and symmetric  DATA:  Duplex of the right leg shows popliteal and superficial femoral DVT.  There is no involvement of the common femoral or external iliac vein.  ASSESSMENT: Right leg popliteal superficial femoral DVT.  Current standard of care for  uncomplicated DVT such as this remains IV heparin followed by transition to oral anticoagulation.  I agree that she should see hematology as consideration would need to be made for lifetime anticoagulation at this point with 1 prior pulmonary embolus.  She does not have any evidence of limb compromise or compartment syndrome.  There is no indication for thrombolysis in this patient with uncomplicated DVT.   PLAN: Anticoagulation as you are doing.  Will sign off   Fabienne Bruns, MD [facility] Office: [phone]

## 2021-01-21 NOTE — Progress Notes (Signed)
PROGRESS NOTE    Shannon Oneal  AVW:[id number] DOB: 01/04/1981 DOA: 01/18/2021 PCP: Pcp, No     Brief Narrative:  Shannon Oneal is an 40 y.o. female past medical history diabetes mellitus type 2, history of PE in 2016 completed 6 months of anticoagulation and stopped, presents to the ED for evaluation of right leg swelling and dyspnea. CTA of the chest was positive for bilateral pulmonary emboli.  Lower extremity Doppler was positive for right leg DVT.  New events last 24 hours / Subjective: Patient continues to have significant pain in her right lower extremity as well as swelling.  Tramadol has not been helping with the pain.  We discussed her co-pay for Xarelto will be over $400.  I have asked TOC to look into cost for Eliquis and Lovenox.  Coumadin is also an option.  Assessment & Plan:   Principal Problem:   Acute pulmonary embolus (HCC) Active Problems:   Acute deep vein thrombosis (DVT) of right lower extremity (HCC)   Type 2 diabetes mellitus with hyperglycemia (HCC)   Acute pulmonary embolism (HCC)   Acute PE with right lower extremity DVT -Echocardiogram without regional wall motion abnormalities or signs of right heart strain -Remains on room air with stable vital signs -Hypercoagulable work-up pending, will need outpatient hematology evaluation -Continues to have significant swelling and pain of the right lower extremity.  Consulted vascular surgery to see if thrombectomy might be an option for her -Xarelto is too expensive with copay of $470, transition to Lovenox.  She may bridge with Coumadin as an outpatient when she establishes with a primary care physician for regular INR checks  Diabetes mellitus type 2 -Continue Lantus, sliding scale insulin    DVT prophylaxis: Lovenox Code Status:     Code Status Orders  (From admission, onward)           Start     Ordered   01/18/21 2319  Full code  Continuous        01/18/21 2320           Code Status History      This patient has a current code status but no historical code status.      Family Communication: None at bedside Disposition Plan:  Status is: Inpatient  Remains inpatient appropriate because:Inpatient level of care appropriate due to severity of illness  Dispo: The patient is from: Home              Anticipated d/c is to: Home              Patient currently is not medically stable to d/c.   Difficult to place patient No      Antimicrobials:  Anti-infectives (From admission, onward)    None        Objective: Vitals:   01/20/21 0457 01/20/21 1508 01/20/21 2035 01/21/21 0621  BP: 121/78 131/90 123/89 124/89  Pulse: 82 93 85   Resp: Temp: 98 F (36.7 C) 98.7 F (37.1 C) 98.2 F (36.8 C) 98.5 F (36.9 C)  TempSrc:  Oral  Oral  SpO2: 99% 99% 99% 99%  Weight:      Height:        Intake/Output Summary (Last 24 hours) at 01/21/2021 1212 Last data filed at 01/21/2021 0900 Gross per 24 hour  Intake 290.88 ml  Output --  Net 290.88 ml   Filed Weights   01/18/21 1705  Weight: 108.4 kg    Examination:  General exam: Appears calm and comfortable  Respiratory system: Clear to auscultation. Respiratory effort normal. No respiratory distress. No conversational dyspnea.  On room air Cardiovascular system: S1 & S2 heard, RRR. No murmurs.  Gastrointestinal system: Abdomen is nondistended, soft and nontender. Normal bowel sounds heard. Central nervous system: Alert and oriented. No focal neurological deficits. Speech clear.  Extremities: Right lower extremity with tenderness to palpation and edema Psychiatry: Judgement and insight appear normal. Mood & affect appropriate.   Data Reviewed: I have personally reviewed following labs and imaging studies  CBC: Recent Labs  Lab 01/18/21 1948 01/19/21 0406 01/20/21 0309  WBC 8.9 8.8 8.9  NEUTROABS 4.3  --   --   HGB 15.3* 14.5 13.8  HCT 47.9* 46.6* 43.4  MCV 74.3* 75.2* 75.5*  PLT 241 229 218   Basic  Metabolic Panel: Recent Labs  Lab 01/18/21 1948 01/19/21 0406  NA 133* 134*  K 4.2 4.0  CL 102 102  CO2 25 24  GLUCOSE 358* 312*  BUN 11 11  CREATININE 1.02* 0.81  CALCIUM 9.2 9.3   GFR: Estimated Creatinine Clearance: 113.1 mL/min (by C-G formula based on SCr of 0.81 mg/dL). Liver Function Tests: No results for input(s): AST, ALT, ALKPHOS, BILITOT, PROT, ALBUMIN in the last 168 hours. No results for input(s): LIPASE, AMYLASE in the last 168 hours. No results for input(s): AMMONIA in the last 168 hours. Coagulation Profile: Recent Labs  Lab 01/18/21 2158  INR 1.1   Cardiac Enzymes: No results for input(s): CKTOTAL, CKMB, CKMBINDEX, TROPONINI in the last 168 hours. BNP (last 3 results) No results for input(s): PROBNP in the last 8760 hours. HbA1C: Recent Labs    01/19/21 0406  HGBA1C 12.9*   CBG: Recent Labs  Lab 01/20/21 1203 01/20/21 1704 01/20/21 2037 01/21/21 0723 01/21/21 1205  GLUCAP 241* 193* 179* 189* 230*   Lipid Profile: No results for input(s): CHOL, HDL, LDLCALC, TRIG, CHOLHDL, LDLDIRECT in the last 72 hours. Thyroid Function Tests: No results for input(s): TSH, T4TOTAL, FREET4, T3FREE, THYROIDAB in the last 72 hours. Anemia Panel: No results for input(s): VITAMINB12, FOLATE, FERRITIN, TIBC, IRON, RETICCTPCT in the last 72 hours. Sepsis Labs: No results for input(s): PROCALCITON, LATICACIDVEN in the last 168 hours.  Recent Results (from the past 240 hour(s))  Resp Panel by RT-PCR (Flu A&B, Covid) Nasopharyngeal Swab     Status: None   Collection Time: 01/18/21 10:01 PM   Specimen: Nasopharyngeal Swab; Nasopharyngeal(NP) swabs in vial transport medium  Result Value Ref Range Status   SARS Coronavirus 2 by RT PCR NEGATIVE NEGATIVE Final    Comment: (NOTE) SARS-CoV-2 target nucleic acids are NOT DETECTED.  The SARS-CoV-2 RNA is generally detectable in upper respiratory specimens during the acute phase of infection. The lowest concentration of  SARS-CoV-2 viral copies this assay can detect is 138 copies/mL. A negative result does not preclude SARS-Cov-2 infection and should not be used as the sole basis for treatment or other patient management decisions. A negative result may occur with  improper specimen collection/handling, submission of specimen other than nasopharyngeal swab, presence of viral mutation(s) within the areas targeted by this assay, and inadequate number of viral copies(<138 copies/mL). A negative result must be combined with clinical observations, patient history, and epidemiological information. The expected result is Negative.  Fact Sheet for Patients:  BloggerCourse.comhttps://www.fda.gov/media/152166/download  Fact Sheet for Healthcare Providers:  SeriousBroker.ithttps://www.fda.gov/media/152162/download  This test is no t yet approved or cleared by the [place] FDA and  has been authorized  for detection and/or diagnosis of SARS-CoV-2 by FDA under an Emergency Use Authorization (EUA). This EUA will remain  in effect (meaning this test can be used) for the duration of the COVID-19 declaration under Section 564(b)(1) of the Act, 21 U.S.C.section 360bbb-3(b)(1), unless the authorization is terminated  or revoked sooner.       Influenza A by PCR NEGATIVE NEGATIVE Final   Influenza B by PCR NEGATIVE NEGATIVE Final    Comment: (NOTE) The Xpert Xpress SARS-CoV-2/FLU/RSV plus assay is intended as an aid in the diagnosis of influenza from Nasopharyngeal swab specimens and should not be used as a sole basis for treatment. Nasal washings and aspirates are unacceptable for Xpert Xpress SARS-CoV-2/FLU/RSV testing.  Fact Sheet for Patients: BloggerCourse.com  Fact Sheet for Healthcare Providers: SeriousBroker.it  This test is not yet approved or cleared by the [place] FDA and has been authorized for detection and/or diagnosis of SARS-CoV-2 by FDA under an Emergency Use  Authorization (EUA). This EUA will remain in effect (meaning this test can be used) for the duration of the COVID-19 declaration under Section 564(b)(1) of the Act, 21 U.S.C. section 360bbb-3(b)(1), unless the authorization is terminated or revoked.  Performed at [facility], [address] [place], [place] [address]       Radiology Studies: ECHOCARDIOGRAM COMPLETE  Result Date: 01/19/2021    ECHOCARDIOGRAM REPORT   Patient Name:   Shannon Oneal Date of Exam: 01/19/2021 Medical Rec #:  [id number]   Height:       65.0 in Accession #:    [id number]  Weight:       239.0 lb Date of Birth:  Oct 04, 1980  BSA:          2.134 m Patient Age:    39 years    BP:           128/76 mmHg Patient Gender: F           HR:           88 bpm. Exam Location:  Inpatient Procedure: 2D Echo, 3D Echo, Cardiac Doppler and Color Doppler Indications:    I26.02 Pulmonary embolus  History:        Patient has no prior history of Echocardiogram examinations.                 Abnormal ECG, Arrythmias:Tachycardia, Signs/Symptoms:Shortness                 of Breath and Dyspnea; Risk Factors:Diabetes.  Sonographer:    Sheralyn Boatman RDCS Referring Phys: [id number] Floreen Comber PATEL  Sonographer Comments: Image acquisition challenging due to patient body habitus. IMPRESSIONS  1. Left ventricular ejection fraction, by estimation, is 55 to 60%. Left ventricular ejection fraction by 3D volume is 58 %. The left ventricle has normal function. The left ventricle has no regional wall motion abnormalities. There is mild left ventricular hypertrophy. Left ventricular diastolic parameters were normal.  2. Right ventricular systolic function is normal. The right ventricular size is normal. Tricuspid regurgitation signal is inadequate for assessing PA pressure.  3. The mitral valve is normal in structure. Trivial mitral valve regurgitation. No evidence of mitral stenosis.  4. The aortic valve is normal in structure. Aortic valve regurgitation is  not visualized. No aortic stenosis is present.  5. The inferior vena cava is normal in size with greater than 50% respiratory variability, suggesting right atrial pressure of 3 mmHg. FINDINGS  Left Ventricle: Left ventricular ejection fraction, by estimation, is 55 to  60%. Left ventricular ejection fraction by 3D volume is 58 %. The left ventricle has normal function. The left ventricle has no regional wall motion abnormalities. The left ventricular internal cavity size was normal in size. There is mild left ventricular hypertrophy. Left ventricular diastolic parameters were normal. Right Ventricle: The right ventricular size is normal. No increase in right ventricular wall thickness. Right ventricular systolic function is normal. Tricuspid regurgitation signal is inadequate for assessing PA pressure. Left Atrium: Left atrial size was normal in size. Right Atrium: Right atrial size was normal in size. Pericardium: There is no evidence of pericardial effusion. Presence of pericardial fat pad. Mitral Valve: The mitral valve is normal in structure. Trivial mitral valve regurgitation. No evidence of mitral valve stenosis. Tricuspid Valve: The tricuspid valve is normal in structure. Tricuspid valve regurgitation is trivial. No evidence of tricuspid stenosis. Aortic Valve: The aortic valve is normal in structure. Aortic valve regurgitation is not visualized. No aortic stenosis is present. Pulmonic Valve: The pulmonic valve was normal in structure. Pulmonic valve regurgitation is not visualized. No evidence of pulmonic stenosis. Aorta: The aortic root is normal in size and structure. Venous: The inferior vena cava is normal in size with greater than 50% respiratory variability, suggesting right atrial pressure of 3 mmHg. IAS/Shunts: There is redundancy of the interatrial septum. No atrial level shunt detected by color flow Doppler.  LEFT VENTRICLE PLAX 2D LVIDd:         3.23 cm         Diastology LVIDs:         2.30 cm          LV e' medial:    7.07 cm/s LV PW:         1.20 cm         LV E/e' medial:  10.6 LV IVS:        1.23 cm         LV e' lateral:   10.00 cm/s LVOT diam:     2.00 cm         LV E/e' lateral: 7.5 LV SV:         61 LV SV Index:   28 LVOT Area:     3.14 cm        3D Volume EF                                LV 3D EF:    Left                                             ventricular LV Volumes (MOD)                            ejection LV vol d, MOD    48.6 ml                    fraction by A2C:                                        3D volume LV vol d, MOD    61.2 ml  is 58 %. A4C: LV vol s, MOD    18.4 ml A2C:                           3D Volume EF: LV vol s, MOD    22.8 ml       3D EF:        58 % A4C: LV SV MOD A2C:   30.2 ml LV SV MOD A4C:   61.2 ml LV SV MOD BP:    34.0 ml RIGHT VENTRICLE             IVC RV S prime:     13.60 cm/s  IVC diam: 1.30 cm TAPSE (M-mode): 2.2 cm LEFT ATRIUM             Index       RIGHT ATRIUM          Index LA diam:        3.40 cm 1.59 cm/m  RA Area:     9.30 cm LA Vol (A2C):   20.3 ml 9.51 ml/m  RA Volume:   17.20 ml 8.06 ml/m LA Vol (A4C):   24.3 ml 11.39 ml/m LA Biplane Vol: 22.7 ml 10.64 ml/m  AORTIC VALVE LVOT Vmax:   128.00 cm/s LVOT Vmean:  80.200 cm/s LVOT VTI:    0.193 m  AORTA Ao Root diam: 2.70 cm Ao Asc diam:  2.70 cm MITRAL VALVE MV Area (PHT): 3.88 cm    SHUNTS MV Decel Time: 196 msec    Systemic VTI:  0.19 m MV E velocity: 74.80 cm/s  Systemic Diam: 2.00 cm MV A velocity: 73.65 cm/s MV E/A ratio:  1.02 Weston Brass MD Electronically signed by Weston Brass MD Signature Date/Time: 01/19/2021/5:39:54 PM    Final       Scheduled Meds:  insulin aspart  0-5 Units Subcutaneous QHS   insulin aspart  0-9 Units Subcutaneous TID WC   insulin glargine  30 Units Subcutaneous BID   polyethylene glycol  17 g Oral BID   rivaroxaban  15 mg Oral BID WC   Followed by   Melene Muller ON 02/11/2021] rivaroxaban  20 mg Oral Q supper   sodium chloride flush  3 mL  Intravenous Q12H   Continuous Infusions:   LOS: 2 days      Time spent: 40 minutes   Noralee Stain, DO [facility] 01/21/2021, 12:12 PM   Available via Epic secure chat 7am-7pm After these hours, please refer to coverage provider listed on [email or web address]

## 2021-01-21 NOTE — Progress Notes (Signed)
Inpatient Diabetes Program Recommendations  AACE/ADA: New Consensus Statement on Inpatient Glycemic Control (2015)  Target Ranges:  Prepandial:   less than 140 mg/dL      Peak postprandial:   less than 180 mg/dL (1-2 hours)      Critically ill patients:  140 - 180 mg/dL   Lab Results  Component Value Date   GLUCAP 189 (H) 01/21/2021   HGBA1C 12.9 (H) 01/19/2021    Review of Glycemic Control  Diabetes history: DM2 Outpatient Diabetes medications: Toujeo 30 units BID, metformin 1000 mg BID, glipizide 10 mg QD Current orders for Inpatient glycemic control: Lantus 30 units BID, Novolog 0-9 units TID with meals and 0-5 HS  HgbA1C - 12.9%  Spoke with pt about HgbA1C of 12.9% (average blood sugar of 324 mg/dL) and importance of controlling blood sugars to prevent long-term complications.   Eating 100%. Post-prandials elevated.  Inpatient Diabetes Program Recommendations:    Add Novolog 3 units TID with meals if eating > 50%.  Continue to follow.  Thank you. Ailene Ards, RD, LDN, CDE Inpatient Diabetes Coordinator [phone]

## 2021-01-21 NOTE — Progress Notes (Signed)
ANTICOAGULATION CONSULT NOTE -   Pharmacy Consult for Lovenox Indication: pulmonary embolus, DVT  Allergies  Allergen Reactions   Amoxapine And Related Other (See Comments)    Burns skin, hair falls out   Amoxicillin Other (See Comments)    Burns skin and hair falls out   Tramadol Itching   Rocephin [Ceftriaxone] Rash    swelling    Patient Measurements: Height: 5\' 5"  (165.1 cm) Weight: 108.4 kg (239 lb) IBW/kg (Calculated) : 57 Heparin Dosing Weight: 82kg  Vital Signs: Temp: 98.5 F (36.9 C) (07/01 0621) Temp Source: Oral (07/01 0621) BP: 124/89 (07/01 0621)  Labs: Recent Labs    01/18/21 1948 01/18/21 1948 01/18/21 2158 01/19/21 0406 01/19/21 1120 01/19/21 2036 01/20/21 0309  HGB 15.3*  --   --  14.5  --   --  13.8  HCT 47.9*  --   --  46.6*  --   --  43.4  PLT 241  --   --  229  --   --  218  APTT  --   --  <20*  --   --   --   --   LABPROT  --   --  13.7  --   --   --   --   INR  --   --  1.1  --   --   --   --   HEPARINUNFRC  --    < >  --  0.26* 0.20* 0.54 0.67  CREATININE 1.02*  --   --  0.81  --   --   --    < > = values in this interval not displayed.    Estimated Creatinine Clearance: 113.1 mL/min (by C-G formula based on SCr of 0.81 mg/dL).  Medical History: Past Medical History:  Diagnosis Date   Asthma    Diabetes mellitus without complication ([place])    PE (pulmonary thromboembolism) ([place]) 2016   Assessment: 40 yo female presents with SOB x 2 weeks and right lower leg pain.  Pt has a history of PE in 2016.  No current AC noted.  Pharmacy consulted to dose heparin for PE  Transitioned to Xarelto 6/30, discharge kit ordered by MD.  Unfortunately patient cannot afford Xarelto or Eliquis, also has no PCP.  Best choice is Lovenox   Goal of Therapy:  Heparin level 0.3-0.7 units/ml Monitor platelets by anticoagulation protocol: Yes   Plan:  Discontinue Xarelto Begin Lovenox 110mg  SQ bid this afternoon 7/1 If patient tolerates Lovenox, can  transition to q24hr dosing at 160mg  q24 (1.5mg /kg/24hr)  Minda Ditto PharmD 01/21/2021, 12:27 PM

## 2021-01-22 ENCOUNTER — Other Ambulatory Visit: Payer: Self-pay

## 2021-01-22 LAB — GLUCOSE, CAPILLARY: Glucose-Capillary: 163 mg/dL — ABNORMAL HIGH (ref 70–99)

## 2021-01-22 MED ORDER — ENOXAPARIN SODIUM 120 MG/0.8ML IJ SOSY: 110.0000 mg | PREFILLED_SYRINGE | Freq: Two times a day (BID) | INTRAMUSCULAR | 2 refills | Status: DC

## 2021-01-22 MED ORDER — DIPHENHYDRAMINE HCL 25 MG PO CAPS: 25.0000 mg | ORAL_CAPSULE | Freq: Four times a day (QID) | ORAL | 0 refills | Status: DC | PRN

## 2021-01-22 MED ORDER — OXYCODONE HCL 5 MG PO TABS: 5.0000 mg | ORAL_TABLET | ORAL | 0 refills | Status: DC | PRN

## 2021-01-22 MED ORDER — INSULIN ASPART 100 UNIT/ML IJ SOLN: 3.0000 [IU] | Freq: Three times a day (TID) | INTRAMUSCULAR | Status: DC

## 2021-01-22 MED ORDER — TOUJEO SOLOSTAR 300 UNIT/ML ~~LOC~~ SOPN: 30.0000 [IU] | PEN_INJECTOR | Freq: Two times a day (BID) | SUBCUTANEOUS | 3 refills | Status: DC

## 2021-01-22 MED ORDER — RIVAROXABAN (XARELTO) EDUCATION KIT FOR DVT/PE PATIENTS: PACK | Freq: Once | Status: DC

## 2021-01-22 NOTE — Progress Notes (Unsigned)
Patient requesting her meds to be sent to [facility] instead of [facility]. Benadryl, oxycodone, lovenox, toujeo sent. Called [facility] to cancel previous prescription.   Noralee Stain, DO [facility] 01/22/2021, 2:20 PM     Patient called nursing unit again, apparently her deductible at the pharmacy is $1300 and cannot afford. She has no way to get lovenox today. I've sent her script for Xarelto. She has a 30-day free Xarelto coupon card and can get the meds today. She will have to transition to lovenox after the holiday weekend when she can figure out her deductible issue.   Noralee Stain, DO [facility] 01/22/2021, 5:48 PM     Patient called nursing unit again, asking for insulin sliding scale to be sent to pharmacy.   Noralee Stain, DO [facility] 01/23/2021, 12:25 PM   Available via Epic secure chat 7am-7pm After these hours, please refer to coverage provider listed on [email or web address]

## 2021-01-22 NOTE — Discharge Summary (Signed)
Physician Discharge Summary  Shannon Oneal:[id number] DOB: 1980-10-20 DOA: 01/18/2021  PCP: Pcp, No  Admit date: 01/18/2021 Discharge date: 01/22/2021  Admitted From: Home Disposition:  Home  Recommendations for Outpatient Follow-up:  Follow up with PCP in 1 week Referred to hematology   Discharge Condition: Stable CODE STATUS: Full  Diet recommendation: Carb modified   Brief/Interim Summary: Shannon Oneal is an 40 y.o. female past medical history diabetes mellitus type 2, history of PE in 2016 completed 6 months of anticoagulation and stopped, presents to the ED for evaluation of right leg swelling and dyspnea. CTA of the chest was positive for bilateral pulmonary emboli.  Lower extremity Doppler was positive for right leg DVT. There was no provoking factor for her VTE. Vascular surgery consulted and did not recommend thrombolysis. Patient was discharged on lovenox (could not afford copay for Xarelto or Eliquis, not established with PCP for regular INR checks on discharge). Hypercoagulable work up pending on discharge. She remained stable on room air prior to discharge.   Discharge Diagnoses:  Principal Problem:   Acute pulmonary embolus (HCC) Active Problems:   Acute deep vein thrombosis (DVT) of right lower extremity (HCC)   Type 2 diabetes mellitus with hyperglycemia (HCC)   Acute pulmonary embolism [facility])   Discharge Instructions  Discharge Instructions     Ambulatory referral to Hematology / Oncology   Complete by: As directed    Call MD for:  difficulty breathing, headache or visual disturbances   Complete by: As directed    Call MD for:  extreme fatigue   Complete by: As directed    Call MD for:  persistant dizziness or light-headedness   Complete by: As directed    Call MD for:  persistant nausea and vomiting   Complete by: As directed    Call MD for:  severe uncontrolled pain   Complete by: As directed    Call MD for:  temperature >100.4   Complete by: As directed     Discharge instructions   Complete by: As directed    You were cared for by a hospitalist during your hospital stay. If you have any questions about your discharge medications or the care you received while you were in the hospital after you are discharged, you can call the unit and ask to speak with the hospitalist on call if the hospitalist that took care of you is not available. Once you are discharged, your primary care physician will handle any further medical issues. Please note that NO REFILLS for any discharge medications will be authorized once you are discharged, as it is imperative that you return to your primary care physician (or establish a relationship with a primary care physician if you do not have one) for your aftercare needs so that they can reassess your need for medications and monitor your lab values.   Increase activity slowly   Complete by: As directed       Allergies as of 01/22/2021       Reactions   Amoxapine And Related Other (See Comments)   Burns skin, hair falls out   Amoxicillin Other (See Comments)   Burns skin and hair falls out   Tramadol Itching   Rocephin [ceftriaxone] Rash   swelling        Medication List     STOP taking these medications    naproxen 250 MG tablet Commonly known as: NAPROSYN   NovoLIN N ReliOn 100 UNIT/ML injection Generic drug: insulin NPH Human  TAKE these medications    diphenhydrAMINE 25 mg capsule Commonly known as: BENADRYL Take 1 capsule (25 mg total) by mouth every 6 (six) hours as needed for itching.   DULoxetine 30 MG capsule Commonly known as: CYMBALTA Take 30 mg by mouth daily.   enoxaparin 120 MG/0.8ML injection Commonly known as: LOVENOX Inject 0.74 mLs (110 mg total) into the skin 2 (two) times daily.   glipiZIDE 10 MG tablet Commonly known as: GLUCOTROL Take 10 mg by mouth daily before breakfast.   metFORMIN 1000 MG tablet Commonly known as: GLUCOPHAGE Take 1,000 mg by mouth 2 (two)  times daily.   oxyCODONE 5 MG immediate release tablet Commonly known as: Oxy IR/ROXICODONE Take 1-2 tablets (5-10 mg total) by mouth every 4 (four) hours as needed for breakthrough pain or severe pain.   Toujeo SoloStar 300 UNIT/ML Solostar Pen Generic drug: insulin glargine (1 Unit Dial) Inject 30 Units into the skin in the morning and at bedtime.               Durable Medical Equipment  (From admission, onward)           Start     Ordered   01/22/21 0840  For home use only DME Crutches  Once        01/22/21 0839            Follow-up Information     PCP Follow up.   Why: Establish with PCP as soon as possible on discharge        Hematology Follow up.   Why: You were referred to hematology for PE and DVT. Call their office next week if you haven't heard regarding your appointment. Contact information: [address],  [place], [place] [address]  [phone]               Allergies  Allergen Reactions   Amoxapine And Related Other (See Comments)    Burns skin, hair falls out   Amoxicillin Other (See Comments)    Burns skin and hair falls out   Tramadol Itching   Rocephin [Ceftriaxone] Rash    swelling    Consultations: Vascular surgery    Procedures/Studies: CT Angio Chest PE W and/or Wo Contrast  Result Date: 01/18/2021 CLINICAL DATA:  PE suspected, low/intermediate prob, positive D-dimer dvt positive EXAM: CT ANGIOGRAPHY CHEST WITH CONTRAST TECHNIQUE: Multidetector CT imaging of the chest was performed using the standard protocol during bolus administration of intravenous contrast. Multiplanar CT image reconstructions and MIPs were obtained to evaluate the vascular anatomy. CONTRAST:  OMNIPAQUE IOHEXOL 350 MG/ML SOLN COMPARISON:  None. FINDINGS: Cardiovascular: There are bilateral central pulmonary emboli in the distal right and left branch pulmonary arteries. Emboli extend into into the lobar, segmental, and subsegmental branches  of the lower lobes bilaterally. There is similar extension in the the upper lobes but to a lesser degree than the lower lobes. RV/LV ratio is approximately 1.02. Mediastinum/Nodes: No enlarged mediastinal, hilar, or axillary lymph nodes. Thyroid gland, trachea, and esophagus demonstrate no significant findings. Lungs/Pleura: Lungs are clear. No pleural effusion or pneumothorax. Upper Abdomen: No acute abnormality. Musculoskeletal: No chest wall abnormality. No acute or significant osseous findings. Review of the MIP images confirms the above findings. IMPRESSION: Bilateral central pulmonary emboli in the distal right and left branch pulmonary arteries, extending into the lobar, segmental, and subsegmental branches. Mildly elevated RV/LV ratio suggesting possible right heart strain. Correlate with echocardiography. Positive for acute PE with CT evidence of right  heart strain (RV/LV Ratio = 1.02) consistent with at least submassive (intermediate risk) PE. The presence of right heart strain has been associated with an increased risk of morbidity and mortality. Please refer to the "PE Focused" order set in EPIC. Critical Value/emergent results were called by telephone at the time of interpretation on 01/18/2021 at 9:38 pm to provider [facility] , who verbally acknowledged these results. Electronically Signed   By: Caprice Renshaw   On: 01/18/2021 21:41   DG Knee Complete 4 Views Left  Result Date: 01/18/2021 CLINICAL DATA:  Knee pain EXAM: LEFT KNEE - COMPLETE 4+ VIEW COMPARISON:  None. FINDINGS: No fracture or malalignment. Mild to moderate medial joint space degenerative change. No sizable knee effusion IMPRESSION: Degenerative changes.  No acute osseous abnormality Electronically Signed   By: Jasmine Pang M.D.   On: 01/18/2021 18:24   DG Knee Complete 4 Views Right  Result Date: 01/18/2021 CLINICAL DATA:  Knee pain EXAM: RIGHT KNEE - COMPLETE 4+ VIEW COMPARISON:  03/26/2016 FINDINGS: No fracture or  malalignment. Mild medial and patellofemoral degenerative changes. No substantial knee effusion IMPRESSION: Mild arthritis.  No acute osseous abnormality Electronically Signed   By: Jasmine Pang M.D.   On: 01/18/2021 18:24   ECHOCARDIOGRAM COMPLETE  Result Date: 01/19/2021    ECHOCARDIOGRAM REPORT   Patient Name:   AMEIA MORENCY Date of Exam: 01/19/2021 Medical Rec #:  [id number]   Height:       65.0 in Accession #:    [id number]  Weight:       239.0 lb Date of Birth:  Aug 07, 1980  BSA:          2.134 m Patient Age:    39 years    BP:           128/76 mmHg Patient Gender: F           HR:           88 bpm. Exam Location:  Inpatient Procedure: 2D Echo, 3D Echo, Cardiac Doppler and Color Doppler Indications:    I26.02 Pulmonary embolus  History:        Patient has no prior history of Echocardiogram examinations.                 Abnormal ECG, Arrythmias:Tachycardia, Signs/Symptoms:Shortness                 of Breath and Dyspnea; Risk Factors:Diabetes.  Sonographer:    Sheralyn Boatman RDCS Referring Phys: [id number] Floreen Comber PATEL  Sonographer Comments: Image acquisition challenging due to patient body habitus. IMPRESSIONS  1. Left ventricular ejection fraction, by estimation, is 55 to 60%. Left ventricular ejection fraction by 3D volume is 58 %. The left ventricle has normal function. The left ventricle has no regional wall motion abnormalities. There is mild left ventricular hypertrophy. Left ventricular diastolic parameters were normal.  2. Right ventricular systolic function is normal. The right ventricular size is normal. Tricuspid regurgitation signal is inadequate for assessing PA pressure.  3. The mitral valve is normal in structure. Trivial mitral valve regurgitation. No evidence of mitral stenosis.  4. The aortic valve is normal in structure. Aortic valve regurgitation is not visualized. No aortic stenosis is present.  5. The inferior vena cava is normal in size with greater than 50% respiratory variability, suggesting  right atrial pressure of 3 mmHg. FINDINGS  Left Ventricle: Left ventricular ejection fraction, by estimation, is 55 to 60%. Left ventricular ejection fraction by 3D volume is 58 %.  The left ventricle has normal function. The left ventricle has no regional wall motion abnormalities. The left ventricular internal cavity size was normal in size. There is mild left ventricular hypertrophy. Left ventricular diastolic parameters were normal. Right Ventricle: The right ventricular size is normal. No increase in right ventricular wall thickness. Right ventricular systolic function is normal. Tricuspid regurgitation signal is inadequate for assessing PA pressure. Left Atrium: Left atrial size was normal in size. Right Atrium: Right atrial size was normal in size. Pericardium: There is no evidence of pericardial effusion. Presence of pericardial fat pad. Mitral Valve: The mitral valve is normal in structure. Trivial mitral valve regurgitation. No evidence of mitral valve stenosis. Tricuspid Valve: The tricuspid valve is normal in structure. Tricuspid valve regurgitation is trivial. No evidence of tricuspid stenosis. Aortic Valve: The aortic valve is normal in structure. Aortic valve regurgitation is not visualized. No aortic stenosis is present. Pulmonic Valve: The pulmonic valve was normal in structure. Pulmonic valve regurgitation is not visualized. No evidence of pulmonic stenosis. Aorta: The aortic root is normal in size and structure. Venous: The inferior vena cava is normal in size with greater than 50% respiratory variability, suggesting right atrial pressure of 3 mmHg. IAS/Shunts: There is redundancy of the interatrial septum. No atrial level shunt detected by color flow Doppler.  LEFT VENTRICLE PLAX 2D LVIDd:         3.23 cm         Diastology LVIDs:         2.30 cm         LV e' medial:    7.07 cm/s LV PW:         1.20 cm         LV E/e' medial:  10.6 LV IVS:        1.23 cm         LV e' lateral:   10.00 cm/s LVOT  diam:     2.00 cm         LV E/e' lateral: 7.5 LV SV:         61 LV SV Index:   28 LVOT Area:     3.14 cm        3D Volume EF                                LV 3D EF:    Left                                             ventricular LV Volumes (MOD)                            ejection LV vol d, MOD    48.6 ml                    fraction by A2C:                                        3D volume LV vol d, MOD    61.2 ml                    is 58 %.  A4C: LV vol s, MOD    18.4 ml A2C:                           3D Volume EF: LV vol s, MOD    22.8 ml       3D EF:        58 % A4C: LV SV MOD A2C:   30.2 ml LV SV MOD A4C:   61.2 ml LV SV MOD BP:    34.0 ml RIGHT VENTRICLE             IVC RV S prime:     13.60 cm/s  IVC diam: 1.30 cm TAPSE (M-mode): 2.2 cm LEFT ATRIUM             Index       RIGHT ATRIUM          Index LA diam:        3.40 cm 1.59 cm/m  RA Area:     9.30 cm LA Vol (A2C):   20.3 ml 9.51 ml/m  RA Volume:   17.20 ml 8.06 ml/m LA Vol (A4C):   24.3 ml 11.39 ml/m LA Biplane Vol: 22.7 ml 10.64 ml/m  AORTIC VALVE LVOT Vmax:   128.00 cm/s LVOT Vmean:  80.200 cm/s LVOT VTI:    0.193 m  AORTA Ao Root diam: 2.70 cm Ao Asc diam:  2.70 cm MITRAL VALVE MV Area (PHT): 3.88 cm    SHUNTS MV Decel Time: 196 msec    Systemic VTI:  0.19 m MV E velocity: 74.80 cm/s  Systemic Diam: 2.00 cm MV A velocity: 73.65 cm/s MV E/A ratio:  1.02 Weston BrassGayatri Acharya MD Electronically signed by Weston BrassGayatri Acharya MD Signature Date/Time: 01/19/2021/5:39:54 PM    Final    VAS US LOWER EXTREMITY VENOUS (DVT) (ONLY [facility] & [facility] 7a-7p)  Result Date: 01/19/2021  Lower Venous DVT Study Patient Name:  Allean FoundSONYA Daddona  Date of Exam:   01/18/2021 Medical Rec #: [id number]    Accession #:    [id number] Date of Birth: 06/13/1981   Patient Gender: F Patient Age:   [age]Y [facility] Procedure:      VAS US LOWER EXTREMITY VENOUS (DVT) Referring Phys: [id number] PETER C MESSICK  --------------------------------------------------------------------------------  Indications: SOB, and RLE - pain and numbness.  Risk Factors: PE 2016. Comparison Study: No previous exams Performing Technologist: Jody Hill RVT, RDMS  Examination Guidelines: A complete evaluation includes B-mode imaging, spectral Doppler, color Doppler, and power Doppler as needed of all accessible portions of each vessel. Bilateral testing is considered an integral part of a complete examination. Limited examinations for reoccurring indications may be performed as noted. The reflux portion of the exam is performed with the patient in reverse Trendelenburg.  +---------+---------------+---------+-----------+----------+-------------------+ RIGHT    CompressibilityPhasicitySpontaneityPropertiesThrombus Aging      +---------+---------------+---------+-----------+----------+-------------------+ CFV      Full           Yes      Yes                                      +---------+---------------+---------+-----------+----------+-------------------+ SFJ      Full                                                             +---------+---------------+---------+-----------+----------+-------------------+  FV Prox  Full           Yes      Yes                                      +---------+---------------+---------+-----------+----------+-------------------+ FV Mid   None           No       No                   Acute               +---------+---------------+---------+-----------+----------+-------------------+ FV DistalNone           No       No                   Acute               +---------+---------------+---------+-----------+----------+-------------------+ PFV      Full                                                             +---------+---------------+---------+-----------+----------+-------------------+ POP      None           No       No                   Acute                +---------+---------------+---------+-----------+----------+-------------------+ PTV      None           No       No                   Acute               +---------+---------------+---------+-----------+----------+-------------------+ PERO                                                  Not visualized on                                                         this exam           +---------+---------------+---------+-----------+----------+-------------------+ Gastroc  None           No       No                   Acute               +---------+---------------+---------+-----------+----------+-------------------+ SSV      None           No       No                   Acute               +---------+---------------+---------+-----------+----------+-------------------+   +---------+---------------+---------+-----------+----------+--------------+ LEFT  CompressibilityPhasicitySpontaneityPropertiesThrombus Aging +---------+---------------+---------+-----------+----------+--------------+ CFV      Full           Yes      Yes                                 +---------+---------------+---------+-----------+----------+--------------+ SFJ      Full                                                        +---------+---------------+---------+-----------+----------+--------------+ FV Prox  Full           Yes      Yes                                 +---------+---------------+---------+-----------+----------+--------------+ FV Mid   Full           Yes      Yes                                 +---------+---------------+---------+-----------+----------+--------------+ FV DistalFull           Yes      Yes                                 +---------+---------------+---------+-----------+----------+--------------+ PFV      Full                                                         +---------+---------------+---------+-----------+----------+--------------+ POP      Full           Yes      Yes                                 +---------+---------------+---------+-----------+----------+--------------+ PTV      Full                                                        +---------+---------------+---------+-----------+----------+--------------+ PERO     Full                                                        +---------+---------------+---------+-----------+----------+--------------+     Summary: BILATERAL: -No evidence of popliteal cyst, bilaterally. RIGHT: - Findings consistent with acute deep vein thrombosis involving the right femoral vein, right popliteal vein, right posterior tibial veins, and right gastrocnemius veins. - Findings consistent with acute superficial vein thrombosis involving the right small saphenous vein.  LEFT: - There is no evidence of deep vein thrombosis in the lower extremity. - There is no  evidence of superficial venous thrombosis.  - Ultrasound characteristics of enlarged lymph nodes noted in the groin.  *See table(s) above for measurements and observations. Electronically signed by Waverly Ferrari MD on 01/19/2021 at 8:15:17 AM.    Final        Discharge Exam: Vitals:   01/21/21 1953 01/22/21 0539  BP: 119/73 117/77  Pulse: 93 80  Resp: 20 18  Temp: 98.7 F (37.1 C) 98 F (36.7 C)  SpO2: 99% 98%    General: Pt is alert, awake, not in acute distress Cardiovascular: RRR, S1/S2 +, RLE edema Respiratory: CTA bilaterally, no wheezing, no rhonchi, no respiratory distress, no conversational dyspnea, on room air  Abdominal: Soft, NT, ND, bowel sounds + Extremities: RLE edema, no cyanosis Psych: Normal mood and affect, stable judgement and insight     The results of significant diagnostics from this hospitalization (including imaging, microbiology, ancillary and laboratory) are listed below for reference.      Microbiology: Recent Results (from the past 240 hour(s))  Resp Panel by RT-PCR (Flu A&B, Covid) Nasopharyngeal Swab     Status: None   Collection Time: 01/18/21 10:01 PM   Specimen: Nasopharyngeal Swab; Nasopharyngeal(NP) swabs in vial transport medium  Result Value Ref Range Status   SARS Coronavirus 2 by RT PCR NEGATIVE NEGATIVE Final    Comment: (NOTE) SARS-CoV-2 target nucleic acids are NOT DETECTED.  The SARS-CoV-2 RNA is generally detectable in upper respiratory specimens during the acute phase of infection. The lowest concentration of SARS-CoV-2 viral copies this assay can detect is 138 copies/mL. A negative result does not preclude SARS-Cov-2 infection and should not be used as the sole basis for treatment or other patient management decisions. A negative result may occur with  improper specimen collection/handling, submission of specimen other than nasopharyngeal swab, presence of viral mutation(s) within the areas targeted by this assay, and inadequate number of viral copies(<138 copies/mL). A negative result must be combined with clinical observations, patient history, and epidemiological information. The expected result is Negative.  Fact Sheet for Patients:  BloggerCourse.com  Fact Sheet for Healthcare Providers:  SeriousBroker.it  This test is no t yet approved or cleared by the [place] FDA and  has been authorized for detection and/or diagnosis of SARS-CoV-2 by FDA under an Emergency Use Authorization (EUA). This EUA will remain  in effect (meaning this test can be used) for the duration of the COVID-19 declaration under Section 564(b)(1) of the Act, 21 U.S.C.section 360bbb-3(b)(1), unless the authorization is terminated  or revoked sooner.       Influenza A by PCR NEGATIVE NEGATIVE Final   Influenza B by PCR NEGATIVE NEGATIVE Final    Comment: (NOTE) The Xpert Xpress SARS-CoV-2/FLU/RSV plus assay is  intended as an aid in the diagnosis of influenza from Nasopharyngeal swab specimens and should not be used as a sole basis for treatment. Nasal washings and aspirates are unacceptable for Xpert Xpress SARS-CoV-2/FLU/RSV testing.  Fact Sheet for Patients: BloggerCourse.com  Fact Sheet for Healthcare Providers: SeriousBroker.it  This test is not yet approved or cleared by the [place] FDA and has been authorized for detection and/or diagnosis of SARS-CoV-2 by FDA under an Emergency Use Authorization (EUA). This EUA will remain in effect (meaning this test can be used) for the duration of the COVID-19 declaration under Section 564(b)(1) of the Act, 21 U.S.C. section 360bbb-3(b)(1), unless the authorization is terminated or revoked.  Performed at [facility], [address] [place], [place] [address]  Labs: BNP (last 3 results) No results for input(s): BNP in the last 8760 hours. Basic Metabolic Panel: Recent Labs  Lab 01/18/21 1948 01/19/21 0406  NA 133* 134*  K 4.2 4.0  CL 102 102  CO2 25 24  GLUCOSE 358* 312*  BUN 11 11  CREATININE 1.02* 0.81  CALCIUM 9.2 9.3   Liver Function Tests: No results for input(s): AST, ALT, ALKPHOS, BILITOT, PROT, ALBUMIN in the last 168 hours. No results for input(s): LIPASE, AMYLASE in the last 168 hours. No results for input(s): AMMONIA in the last 168 hours. CBC: Recent Labs  Lab 01/18/21 1948 01/19/21 0406 01/20/21 0309  WBC 8.9 8.8 8.9  NEUTROABS 4.3  --   --   HGB 15.3* 14.5 13.8  HCT 47.9* 46.6* 43.4  MCV 74.3* 75.2* 75.5*  PLT 241 229 218   Cardiac Enzymes: No results for input(s): CKTOTAL, CKMB, CKMBINDEX, TROPONINI in the last 168 hours. BNP: Invalid input(s): POCBNP CBG: Recent Labs  Lab 01/21/21 0723 01/21/21 1205 01/21/21 1639 01/21/21 2149 01/22/21 0744  GLUCAP 189* 230* 171* 201* 163*   D-Dimer No results for input(s):  DDIMER in the last 72 hours. Hgb A1c No results for input(s): HGBA1C in the last 72 hours. Lipid Profile No results for input(s): CHOL, HDL, LDLCALC, TRIG, CHOLHDL, LDLDIRECT in the last 72 hours. Thyroid function studies No results for input(s): TSH, T4TOTAL, T3FREE, THYROIDAB in the last 72 hours.  Invalid input(s): FREET3 Anemia work up No results for input(s): VITAMINB12, FOLATE, FERRITIN, TIBC, IRON, RETICCTPCT in the last 72 hours. Urinalysis    Component Value Date/Time   COLORURINE YELLOW 06/02/2020 1255   APPEARANCEUR CLEAR 06/02/2020 1255   LABSPEC 1.018 06/02/2020 1255   PHURINE 5.0 06/02/2020 1255   GLUCOSEU NEGATIVE 06/02/2020 1255   HGBUR NEGATIVE 06/02/2020 1255   BILIRUBINUR NEGATIVE 06/02/2020 1255   KETONESUR 5 (A) 06/02/2020 1255   PROTEINUR NEGATIVE 06/02/2020 1255   NITRITE NEGATIVE 06/02/2020 1255   LEUKOCYTESUR NEGATIVE 06/02/2020 1255   Sepsis Labs Invalid input(s): PROCALCITONIN,  WBC,  LACTICIDVEN Microbiology Recent Results (from the past 240 hour(s))  Resp Panel by RT-PCR (Flu A&B, Covid) Nasopharyngeal Swab     Status: None   Collection Time: 01/18/21 10:01 PM   Specimen: Nasopharyngeal Swab; Nasopharyngeal(NP) swabs in vial transport medium  Result Value Ref Range Status   SARS Coronavirus 2 by RT PCR NEGATIVE NEGATIVE Final    Comment: (NOTE) SARS-CoV-2 target nucleic acids are NOT DETECTED.  The SARS-CoV-2 RNA is generally detectable in upper respiratory specimens during the acute phase of infection. The lowest concentration of SARS-CoV-2 viral copies this assay can detect is 138 copies/mL. A negative result does not preclude SARS-Cov-2 infection and should not be used as the sole basis for treatment or other patient management decisions. A negative result may occur with  improper specimen collection/handling, submission of specimen other than nasopharyngeal swab, presence of viral mutation(s) within the areas targeted by this assay, and  inadequate number of viral copies(<138 copies/mL). A negative result must be combined with clinical observations, patient history, and epidemiological information. The expected result is Negative.  Fact Sheet for Patients:  BloggerCourse.com  Fact Sheet for Healthcare Providers:  SeriousBroker.it  This test is no t yet approved or cleared by the [place] FDA and  has been authorized for detection and/or diagnosis of SARS-CoV-2 by FDA under an Emergency Use Authorization (EUA). This EUA will remain  in effect (meaning this test can be used) for the duration of  the COVID-19 declaration under Section 564(b)(1) of the Act, 21 U.S.C.section 360bbb-3(b)(1), unless the authorization is terminated  or revoked sooner.       Influenza A by PCR NEGATIVE NEGATIVE Final   Influenza B by PCR NEGATIVE NEGATIVE Final    Comment: (NOTE) The Xpert Xpress SARS-CoV-2/FLU/RSV plus assay is intended as an aid in the diagnosis of influenza from Nasopharyngeal swab specimens and should not be used as a sole basis for treatment. Nasal washings and aspirates are unacceptable for Xpert Xpress SARS-CoV-2/FLU/RSV testing.  Fact Sheet for Patients: BloggerCourse.com  Fact Sheet for Healthcare Providers: SeriousBroker.it  This test is not yet approved or cleared by the [place] FDA and has been authorized for detection and/or diagnosis of SARS-CoV-2 by FDA under an Emergency Use Authorization (EUA). This EUA will remain in effect (meaning this test can be used) for the duration of the COVID-19 declaration under Section 564(b)(1) of the Act, 21 U.S.C. section 360bbb-3(b)(1), unless the authorization is terminated or revoked.  Performed at [facility], [address], [place], [place] [address]      Patient was seen and examined on the day of discharge and was found to be  in stable condition. Time coordinating discharge: 25 minutes including assessment and coordination of care, as well as examination of the patient.   SIGNED:  Noralee Stain, DO [facility] 01/22/2021, 8:46 AM

## 2021-01-22 NOTE — TOC Transition Note (Signed)
Transition of Care [facility]) - CM/SW Discharge Note   Patient Details  Name: Shannon Oneal MRN: [id number] Date of Birth: [birth year]  [facility] of [facility]) CM/SW Contact:  Golda Acre, RN Phone Number: 01/22/2021, 9:23 AM   Clinical Narrative:    Text to Zack  lank  with [employer]- crutches ordered.   Final next level of care: Home/Self Care Barriers to Discharge: No Barriers Identified   Patient Goals and CMS Choice        Discharge Placement                       Discharge Plan and Services                DME Arranged: Crutches DME Agency: [employer] Date DME Agency Contacted: 01/22/21 Time DME Agency Contacted: [phone] Representative spoke with at DME Agency: [place]            Social Determinants of Health (SDOH) Interventions     Readmission Risk Interventions No flowsheet data found.

## 2021-01-23 ENCOUNTER — Encounter (HOSPITAL_COMMUNITY): Payer: Self-pay

## 2021-01-23 ENCOUNTER — Other Ambulatory Visit: Payer: Self-pay

## 2021-01-23 ENCOUNTER — Emergency Department (HOSPITAL_COMMUNITY)
Admission: EM | Admit: 2021-01-23 | Discharge: 2021-01-23 | Disposition: A | Discharge status: Home / Self Care | Payer: BC Managed Care – PPO | Attending: Emergency Medicine | Admitting: Emergency Medicine

## 2021-01-23 DIAGNOSIS — J45909 Unspecified asthma, uncomplicated: Secondary | ICD-10-CM | POA: Insufficient documentation

## 2021-01-23 DIAGNOSIS — Z7901 Long term (current) use of anticoagulants: Secondary | ICD-10-CM | POA: Insufficient documentation

## 2021-01-23 DIAGNOSIS — M79661 Pain in right lower leg: Secondary | ICD-10-CM | POA: Diagnosis not present

## 2021-01-23 DIAGNOSIS — E1165 Type 2 diabetes mellitus with hyperglycemia: Secondary | ICD-10-CM | POA: Insufficient documentation

## 2021-01-23 DIAGNOSIS — Z7984 Long term (current) use of oral hypoglycemic drugs: Secondary | ICD-10-CM | POA: Diagnosis not present

## 2021-01-23 DIAGNOSIS — Z794 Long term (current) use of insulin: Secondary | ICD-10-CM | POA: Insufficient documentation

## 2021-01-23 DIAGNOSIS — I749 Embolism and thrombosis of unspecified artery: Secondary | ICD-10-CM

## 2021-01-23 DIAGNOSIS — I82401 Acute embolism and thrombosis of unspecified deep veins of right lower extremity: Secondary | ICD-10-CM | POA: Insufficient documentation

## 2021-01-23 DIAGNOSIS — Z87891 Personal history of nicotine dependence: Secondary | ICD-10-CM | POA: Diagnosis not present

## 2021-01-23 MED ORDER — RIVAROXABAN 15 MG PO TABS
15.0000 mg | ORAL_TABLET | Freq: Once | ORAL | Status: AC
  Administered 2021-01-23: 15 mg via ORAL
  Filled 2021-01-23 (×2): qty 1

## 2021-01-23 MED ORDER — INSULIN ASPART 100 UNIT/ML FLEXPEN: PEN_INJECTOR | SUBCUTANEOUS | 11 refills | Status: DC

## 2021-01-23 MED ORDER — INSULIN PEN NEEDLE 31G X 5 MM MISC: 0 refills | Status: DC

## 2021-01-23 MED ORDER — RIVAROXABAN (XARELTO) VTE STARTER PACK (15 & 20 MG): ORAL_TABLET | ORAL | 0 refills | Status: DC

## 2021-01-23 NOTE — Discharge Instructions (Addendum)
Call your primary care doctor or specialist as discussed in the next 2-3 days.   Return immediately back to the ER if:  Your symptoms worsen within the next 12-24 hours. You develop new symptoms such as new fevers, persistent vomiting, new pain, shortness of breath, or new weakness or numbness, or if you have any other concerns.  

## 2021-01-23 NOTE — ED Provider Notes (Signed)
[facility]-EMERGENCY DEPT Provider Note   CSN: [id number] Arrival date & time: 01/23/21  1616     History No chief complaint on file.   Shannon Oneal is a 40 y.o. female.  Patient presents with right leg pain ongoing for a week.  She was recently diagnosed with pulmonary embolism and DVT in the right lower extremity.  She was hospitalized for several days and ultimately discharged yesterday and given instructions to take Lovenox.  However when she tried to get her Lovenox she was being charged over thousand dollars.  She states that she is appealing this to insurance but it may take several days and she presents to the ER she does not have any other anticoagulation for her comorbidities.  Denies fevers or cough or vomiting or diarrhea.      Past Medical History:  Diagnosis Date   Asthma    Diabetes mellitus without complication (HCC)    PE (pulmonary thromboembolism) (HCC) 2016    Patient Active Problem List   Diagnosis Date Noted   Acute pulmonary embolism (HCC) 01/19/2021   Acute pulmonary embolus (HCC) 01/18/2021   Acute deep vein thrombosis (DVT) of right lower extremity (HCC) 01/18/2021   Type 2 diabetes mellitus with hyperglycemia (HCC) 01/18/2021    History reviewed. No pertinent surgical history.   OB History   No obstetric history on file.     Family History  Problem Relation Age of Onset   Deep vein thrombosis Father     Social History   Tobacco Use   Smoking status: Never   Smokeless tobacco: Never  Vaping Use   Vaping Use: Former   Substances: CBD  Substance Use Topics   Alcohol use: Yes   Drug use: Never    Home Medications Prior to Admission medications   Medication Sig Start Date End Date Taking? Authorizing Provider  RIVAROXABAN Carlena Hurl) VTE STARTER PACK (15 & 20 MG) Follow package directions: Take one 15mg  tablet by mouth twice a day. On day 22, switch to one 20mg  tablet once a day. Take with food. 01/23/21  Yes , MD  diphenhydrAMINE (BENADRYL) 25 mg capsule Take 1 capsule (25 mg total) by mouth every 6 (six) hours as needed for itching. 01/22/21   Cheryll Cockayne, DO  DULoxetine (CYMBALTA) 30 MG capsule Take 30 mg by mouth daily. 01/18/20   [provider]  enoxaparin (LOVENOX) 120 MG/0.8ML injection Inject 0.74 mLs (110 mg total) into the skin 2 (two) times daily. 01/22/21 04/22/21  03/25/21, DO  glipiZIDE (GLUCOTROL) 10 MG tablet Take 10 mg by mouth daily before breakfast.    [provider]  insulin aspart (NOVOLOG) 100 UNIT/ML FlexPen Check your blood sugar prior to your meal, three times daily. Then depending on your blood sugar, inject insulin as follows: Blood Glucose 121 - 150: 1 unit. BG 151 - 200: 2 units. BG 201 - 250: 3 units. BG 251 - 300: 5 units. BG 301 - 350: 7 units. BG 351 - 400: 9 units 01/23/21   Noralee Stain, DO  Insulin Pen Needle 31G X 5 MM MISC Three times daily 01/23/21   Noralee Stain, DO  metFORMIN (GLUCOPHAGE) 1000 MG tablet Take 1,000 mg by mouth 2 (two) times daily. 01/18/20   [provider]  oxyCODONE (OXY IR/ROXICODONE) 5 MG immediate release tablet Take 1-2 tablets (5-10 mg total) by mouth every 4 (four) hours as needed for breakthrough pain or severe pain. 01/22/21   01/20/20, DO  TOUJEO SOLOSTAR 300 UNIT/ML Solostar Pen Inject 30 Units into the skin in the morning and at bedtime. 01/22/21 04/22/21  Noralee Stain, DO  gabapentin (NEURONTIN) 300 MG capsule Take 1 capsule (300 mg total) by mouth 3 (three) times daily. Patient not taking: No sig reported 10/31/19 01/18/21  Pollyann Savoy, MD  metoCLOPramide (REGLAN) 10 MG tablet Take 1 tablet (10 mg total) by mouth every 6 (six) hours as needed (Nausea or headache). Patient not taking: Reported on 10/12/2017 02/18/13 10/31/19  Dione Booze, MD  promethazine (PHENERGAN) 25 MG tablet Take 1 tablet (25 mg total) by mouth every 6 (six) hours as needed for nausea or vomiting (headache). Patient not  taking: No sig reported 06/04/20 01/18/21  Charlynne Pander, MD    Allergies    Amoxapine and related, Amoxicillin, Tramadol, and Rocephin [ceftriaxone]  Review of Systems   Review of Systems  Constitutional:  Negative for fever.  HENT:  Negative for ear pain.   Eyes:  Negative for pain.  Respiratory:  Negative for cough.   Cardiovascular:  Negative for chest pain.  Gastrointestinal:  Negative for abdominal pain.  Genitourinary:  Negative for flank pain.  Musculoskeletal:  Negative for back pain.  Skin:  Negative for rash.  Neurological:  Negative for headaches.   Physical Exam Updated Vital Signs BP (!) 116/91 (BP Location: Right Arm)   Pulse 95   Temp 98.5 F (36.9 C) (Oral)   Resp 15   Ht 5\' 5"  (1.651 m)   Wt 108.4 kg   SpO2 95%   BMI 39.77 kg/m   Physical Exam Constitutional:      General: She is not in acute distress.    Appearance: Normal appearance.  HENT:     Head: Normocephalic.     Nose: Nose normal.  Eyes:     Extraocular Movements: Extraocular movements intact.  Cardiovascular:     Rate and Rhythm: Normal rate.  Pulmonary:     Effort: Pulmonary effort is normal.  Musculoskeletal:        General: Normal range of motion.     Cervical back: Normal range of motion.  Neurological:     General: No focal deficit present.     Mental Status: She is alert. Mental status is at baseline.    ED Results / Procedures / Treatments   Labs (all labs ordered are listed, but only abnormal results are displayed) Labs Reviewed - No data to display  EKG None  Radiology No results found.  Procedures Procedures   Medications Ordered in ED Medications - No data to display  ED Course  I have reviewed the triage vital signs and the nursing notes.  Pertinent labs & imaging results that were available during my care of the patient were reviewed by me and considered in my medical decision making (see chart for details).    MDM Rules/Calculators/A&P                           Patient states that during her hospitalization she was initially started on Xarelto.  But they found out that her overall co-pay for Xarelto will be $500 with a switch her to Lovenox which she had received in the last few days of her hospitalization.  When she was discharged and she went to fill her prescription of Lovenox her charge was over thousand dollars and she was unable to purchase a Lovenox.  She states that she called the hospital  to try to get a physician to call in order for Xarelto but she was unable to get in touch with anybody, so she presents back to the ER.  Even if the patient were to appeal her insurance company for Lovenox it is unclear when they would approve it and when she would be able to have access to it.  Patient does have access to Xarelto for free for 1 month which she prefers to take while she is awaiting her insurance to authorize a more affordable Lovenox for her.  Will be given a prescription of Lovenox for her to take at home.   Final Clinical Impression(s) / ED Diagnoses Final diagnoses:  Thromboembolism [facility])    Rx / DC Orders ED Discharge Orders          Ordered    RIVAROXABAN (XARELTO) VTE STARTER PACK (15 & 20 MG)        01/23/21 1752             Cheryll Cockayne, MD 01/23/21 [phone]

## 2021-01-23 NOTE — ED Triage Notes (Signed)
Patient states that she was hospitalized for a  right leg DVT and bilateral PE's.  Patient states she was given a prescription for Lovenox, but there was a $1400.00 payment. Patient states she can appeal the insurance company once open, but was told to come to the ED for a Lovenox injection  Patient was given a Welcome Kit for Xarelto, but ordering physician wanted her to have Lovenox and the Prescription for tht never made it to the pharmacy.

## 2021-01-25 LAB — FACTOR 5 LEIDEN

## 2021-01-26 LAB — PROTHROMBIN GENE MUTATION

## 2021-01-27 ENCOUNTER — Telehealth: Payer: Self-pay | Admitting: Physician Assistant

## 2021-01-27 NOTE — Telephone Encounter (Signed)
Received a new hem referral from Dr. Alvino Chapel at the ED for DVT. Ms. Shannon Oneal returned my call and has been scheduled to see Karena Addison on 7/15 at 11am. Pt aware to arrive 20 minutes early.

## 2021-01-30 ENCOUNTER — Encounter (HOSPITAL_COMMUNITY): Payer: Self-pay | Admitting: Emergency Medicine

## 2021-01-30 ENCOUNTER — Other Ambulatory Visit: Payer: Self-pay

## 2021-01-30 ENCOUNTER — Emergency Department (HOSPITAL_COMMUNITY)
Admission: EM | Admit: 2021-01-30 | Discharge: 2021-01-30 | Disposition: A | Discharge status: Home / Self Care | Payer: BC Managed Care – PPO | Attending: Emergency Medicine | Admitting: Emergency Medicine

## 2021-01-30 DIAGNOSIS — Z7984 Long term (current) use of oral hypoglycemic drugs: Secondary | ICD-10-CM | POA: Insufficient documentation

## 2021-01-30 DIAGNOSIS — Z794 Long term (current) use of insulin: Secondary | ICD-10-CM | POA: Diagnosis not present

## 2021-01-30 DIAGNOSIS — J45909 Unspecified asthma, uncomplicated: Secondary | ICD-10-CM | POA: Diagnosis not present

## 2021-01-30 DIAGNOSIS — E119 Type 2 diabetes mellitus without complications: Secondary | ICD-10-CM | POA: Diagnosis not present

## 2021-01-30 DIAGNOSIS — Z7901 Long term (current) use of anticoagulants: Secondary | ICD-10-CM | POA: Insufficient documentation

## 2021-01-30 DIAGNOSIS — L0100 Impetigo, unspecified: Secondary | ICD-10-CM | POA: Diagnosis not present

## 2021-01-30 DIAGNOSIS — M7989 Other specified soft tissue disorders: Secondary | ICD-10-CM | POA: Diagnosis not present

## 2021-01-30 DIAGNOSIS — Z87891 Personal history of nicotine dependence: Secondary | ICD-10-CM | POA: Insufficient documentation

## 2021-01-30 MED ORDER — MUPIROCIN CALCIUM 2 % EX CREA: 1.0000 "application " | TOPICAL_CREAM | Freq: Two times a day (BID) | CUTANEOUS | 0 refills | Status: DC

## 2021-01-30 NOTE — ED Provider Notes (Signed)
[facility]-EMERGENCY DEPT Provider Note   CSN: [id number] Arrival date & time: 01/30/21  1825     History No chief complaint on file.   Shannon Oneal is a 40 y.o. female.  HPI She presents with persistent and worsening right leg swelling, following treatment for right leg DVT with pulmonary emboli, with Xarelto, currently twice a day.  Since she was discharged from the hospital about a week ago of this problem, she has noticed increased pain and swelling in the right leg.  She has been elevating the right leg only at nighttime, and walking a lot, in her home.  She works for home, as a Careers adviser.  She denies worsening shortness of breath, fever, chills, cough, weakness or dizziness.  She has noticed a rash of her legs, over the last several days.  She denies being bit by insects.  There are no other known active modifying factors.    Past Medical History:  Diagnosis Date   Asthma    Diabetes mellitus without complication (HCC)    PE (pulmonary thromboembolism) (HCC) 2016    Patient Active Problem List   Diagnosis Date Noted   Acute pulmonary embolism (HCC) 01/19/2021   Acute pulmonary embolus (HCC) 01/18/2021   Acute deep vein thrombosis (DVT) of right lower extremity (HCC) 01/18/2021   Type 2 diabetes mellitus with hyperglycemia (HCC) 01/18/2021    History reviewed. No pertinent surgical history.   OB History   No obstetric history on file.     Family History  Problem Relation Age of Onset   Deep vein thrombosis Father     Social History   Tobacco Use   Smoking status: Never   Smokeless tobacco: Never  Vaping Use   Vaping Use: Former   Substances: CBD  Substance Use Topics   Alcohol use: Yes   Drug use: Never    Home Medications Prior to Admission medications   Medication Sig Start Date End Date Taking? Authorizing Provider  mupirocin cream (BACTROBAN) 2 % Apply 1 application topically 2 (two) times daily. Apply to rash on  legs 01/30/21  Yes Mancel Bale, MD  diphenhydrAMINE (BENADRYL) 25 mg capsule Take 1 capsule (25 mg total) by mouth every 6 (six) hours as needed for itching. 01/22/21   Noralee Stain, DO  DULoxetine (CYMBALTA) 30 MG capsule Take 30 mg by mouth daily. 01/18/20   [provider]  enoxaparin (LOVENOX) 120 MG/0.8ML injection Inject 0.74 mLs (110 mg total) into the skin 2 (two) times daily. 01/22/21 04/22/21  Noralee Stain, DO  glipiZIDE (GLUCOTROL) 10 MG tablet Take 10 mg by mouth daily before breakfast.    [provider]  insulin aspart (NOVOLOG) 100 UNIT/ML FlexPen Check your blood sugar prior to your meal, three times daily. Then depending on your blood sugar, inject insulin as follows: Blood Glucose 121 - 150: 1 unit. BG 151 - 200: 2 units. BG 201 - 250: 3 units. BG 251 - 300: 5 units. BG 301 - 350: 7 units. BG 351 - 400: 9 units 01/23/21   Noralee Stain, DO  Insulin Pen Needle 31G X 5 MM MISC Three times daily 01/23/21   Noralee Stain, DO  metFORMIN (GLUCOPHAGE) 1000 MG tablet Take 1,000 mg by mouth 2 (two) times daily. 01/18/20   [provider]  oxyCODONE (OXY IR/ROXICODONE) 5 MG immediate release tablet Take 1-2 tablets (5-10 mg total) by mouth every 4 (four) hours as needed for breakthrough pain or severe pain. 01/22/21   Alvino Chapel,  Victorino Dike, DO  RIVAROXABAN Carlena Hurl) VTE STARTER PACK (15 & 20 MG) Follow package directions: Take one 15mg  tablet by mouth twice a day. On day 22, switch to one 20mg  tablet once a day. Take with food. 01/23/21   , MD  TOUJEO SOLOSTAR 300 UNIT/ML Solostar Pen Inject 30 Units into the skin in the morning and at bedtime. 01/22/21 04/22/21  03/25/21, DO  gabapentin (NEURONTIN) 300 MG capsule Take 1 capsule (300 mg total) by mouth 3 (three) times daily. Patient not taking: No sig reported 10/31/19 01/18/21  12/31/19, MD  metoCLOPramide (REGLAN) 10 MG tablet Take 1 tablet (10 mg total) by mouth every 6 (six) hours as needed (Nausea or  headache). Patient not taking: Reported on 10/12/2017 02/18/13 10/31/19  02/20/13, MD  promethazine (PHENERGAN) 25 MG tablet Take 1 tablet (25 mg total) by mouth every 6 (six) hours as needed for nausea or vomiting (headache). Patient not taking: No sig reported 06/04/20 01/18/21  13/12/21, MD    Allergies    Amoxapine and related, Amoxicillin, Tramadol, and Rocephin [ceftriaxone]  Review of Systems   Review of Systems  All other systems reviewed and are negative.  Physical Exam Updated Vital Signs BP 131/79 (BP Location: Left Arm)   Pulse 89   Temp 98.1 F (36.7 C) (Oral)   Resp 18   Ht 5\' 5"  (1.651 m)   Wt 108.4 kg   SpO2 100%   BMI 39.77 kg/m   Physical Exam Vitals and nursing note reviewed.  Constitutional:      General: She is not in acute distress.    Appearance: She is well-developed. She is obese. She is not ill-appearing, toxic-appearing or diaphoretic.  HENT:     Head: Normocephalic and atraumatic.     Right Ear: External ear normal.     Left Ear: External ear normal.  Eyes:     Conjunctiva/sclera: Conjunctivae normal.     Pupils: Pupils are equal, round, and reactive to light.  Neck:     Trachea: Phonation normal.  Cardiovascular:     Rate and Rhythm: Normal rate.  Pulmonary:     Effort: Pulmonary effort is normal.  Abdominal:     General: There is no distension.  Musculoskeletal:        General: Normal range of motion.     Cervical back: Normal range of motion and neck supple.     Comments: Right leg swollen as compared to left.  It is mildly tender, diffusely.  Skin:    General: Skin is warm and dry.     Comments: She has scattered rash of the legs.  These are present on the right upper thigh, left lower thigh, and right lower leg.  They are characterized by very superficial ulceration, with redness but no drainage, fluctuance or bleeding.  There is a small area similar to that on her right forearm that she states is related to her prior IV,  last week.  Neurological:     Mental Status: She is alert and oriented to person, place, and time.     Cranial Nerves: No cranial nerve deficit.     Sensory: No sensory deficit.     Motor: No abnormal muscle tone.     Coordination: Coordination normal.  Psychiatric:        Mood and Affect: Mood normal.        Behavior: Behavior normal.        Thought Content: Thought content  normal.        Judgment: Judgment normal.    ED Results / Procedures / Treatments   Labs (all labs ordered are listed, but only abnormal results are displayed) Labs Reviewed - No data to display  EKG None  Radiology No results found.  Procedures Procedures   Medications Ordered in ED Medications - No data to display  ED Course  I have reviewed the triage vital signs and the nursing notes.  Pertinent labs & imaging results that were available during my care of the patient were reviewed by me and considered in my medical decision making (see chart for details).    MDM Rules/Calculators/A&P                           No data found.  At time of discharge -reevaluation with update and discussion. After initial assessment and treatment, an updated evaluation reveals she is comfortable has no further complaints.  Findings discussed and questions answered. Mancel Bale   Medical Decision Making:  This patient is presenting for evaluation of rash, which does require a range of treatment options, and is a complaint that involves a moderate risk of morbidity and mortality. The differential diagnoses include drug reaction, skin rash, metabolic disorder, complications from DVT/PE.. I decided to review old records, and in summary patient discharged from hospital 1 week ago after initiation of anticoagulant medication for VTE.  Presenting with rash of nonspecific nature..  I did not require additional historical information from anyone.    Critical Interventions-clinical evaluation, discussion with patient,  arrangements for follow-up care  After These Interventions, the Patient was reevaluated and was found stable for discharge.  Doubt complications from VTE, vital signs normal.  Rash is nonspecific but possibly impetigo.  Doubt sepsis or metabolic disorder.  Rash can be treated topically for bacterial infection  CRITICAL CARE-no Performed by: Mancel Bale  Nursing Notes Reviewed/ Care Coordinated Applicable Imaging Reviewed Interpretation of Laboratory Data incorporated into ED treatment  The patient appears reasonably screened and/or stabilized for discharge and I doubt any other medical condition or other [facility] requiring further screening, evaluation, or treatment in the ED at this time prior to discharge.  Plan: Home Medications-continue usual; Home Treatments-wash 2 or 3 times a day; return here if the recommended treatment, does not improve the symptoms; Recommended follow up-PCP, PR     Final Clinical Impression(s) / ED Diagnoses Final diagnoses:  Right leg swelling  Impetigo    Rx / DC Orders ED Discharge Orders          Ordered    mupirocin cream (BACTROBAN) 2 %  2 times daily        01/30/21 2142             Mancel Bale, MD 02/01/21 0015

## 2021-01-30 NOTE — ED Provider Notes (Signed)
Emergency Medicine Provider Triage Evaluation Note  Shannon Oneal , a 40 y.o. female  was evaluated in triage.  Pt complains of states continued swelling of right lower extremity. Now with some blisters.  Review of Systems  Positive: Leg swelling Negative: Fever  Physical Exam  BP 126/88 (BP Location: Left Arm)   Pulse 93   Temp 98.4 F (36.9 C) (Oral)   Resp 19   Ht 5\' 5"  (1.651 m)   Wt 108.4 kg   SpO2 99%   BMI 39.77 kg/m  Gen:   Awake, no distress   Resp:  Normal effort  MSK:   Moves extremities without difficulty  Other:  RUE more swollen than left, palpable pulses distally (difficult 2/2 edema)  Medical Decision Making  Medically screening exam initiated at 7:22 PM.  Appropriate orders placed.  Shannon Oneal was informed that the remainder of the evaluation will be completed by another provider, this initial triage assessment does not replace that evaluation, and the importance of remaining in the ED until their evaluation is complete.  No critical limb ischemia.  Sensation movement intact. Doesn't appear to have C dolens.    Shannon Oneal, DOLE 01/30/21 1924    04/02/21, MD 02/01/21 [phone]

## 2021-01-30 NOTE — ED Triage Notes (Signed)
Pt recently discharged for bilateral Pe's and DVT's, states she has R leg swelling that is new since Tuesday, intermittent SOB. Was placed on xeratlo, taking as directed. Reports new blisters/sores on her body.

## 2021-01-30 NOTE — Discharge Instructions (Addendum)
To help the pain and swelling in your right leg, elevated above your heart is much as possible both day and nighttime.  For the rash in your legs clean well with soap and water at least twice a day.  After that rinse and dry well, then apply a small coating of the antibiotic cream that we sent to your pharmacy.  Continue this until improved.  Follow-up with your doctor or return here as needed for problems or other concerns.

## 2021-02-04 ENCOUNTER — Other Ambulatory Visit: Payer: Self-pay

## 2021-02-04 ENCOUNTER — Inpatient Hospital Stay: Payer: BC Managed Care – PPO | Attending: Physician Assistant | Admitting: Physician Assistant

## 2021-02-04 ENCOUNTER — Ambulatory Visit (HOSPITAL_COMMUNITY): Admission: RE | Admit: 2021-02-04 | Payer: BC Managed Care – PPO | Source: Ambulatory Visit

## 2021-02-04 ENCOUNTER — Encounter: Payer: Self-pay | Admitting: Physician Assistant

## 2021-02-04 ENCOUNTER — Emergency Department (HOSPITAL_BASED_OUTPATIENT_CLINIC_OR_DEPARTMENT_OTHER): Payer: BC Managed Care – PPO

## 2021-02-04 ENCOUNTER — Inpatient Hospital Stay (HOSPITAL_COMMUNITY)
Admission: EM | Admit: 2021-02-04 | Discharge: 2021-02-06 | DRG: 300 | Disposition: A | Discharge status: Home / Self Care | Payer: BC Managed Care – PPO | Attending: Internal Medicine | Admitting: Internal Medicine

## 2021-02-04 ENCOUNTER — Inpatient Hospital Stay: Payer: BC Managed Care – PPO

## 2021-02-04 ENCOUNTER — Encounter (HOSPITAL_COMMUNITY): Payer: Self-pay

## 2021-02-04 VITALS — BP 131/86 | HR 86 | Temp 98.0°F | Resp 18 | Wt 235.0 lb

## 2021-02-04 DIAGNOSIS — I2699 Other pulmonary embolism without acute cor pulmonale: Secondary | ICD-10-CM

## 2021-02-04 DIAGNOSIS — E669 Obesity, unspecified: Secondary | ICD-10-CM | POA: Diagnosis not present

## 2021-02-04 DIAGNOSIS — I82401 Acute embolism and thrombosis of unspecified deep veins of right lower extremity: Secondary | ICD-10-CM | POA: Diagnosis not present

## 2021-02-04 DIAGNOSIS — E11628 Type 2 diabetes mellitus with other skin complications: Secondary | ICD-10-CM | POA: Diagnosis not present

## 2021-02-04 DIAGNOSIS — Z86711 Personal history of pulmonary embolism: Secondary | ICD-10-CM | POA: Diagnosis not present

## 2021-02-04 DIAGNOSIS — Z7901 Long term (current) use of anticoagulants: Secondary | ICD-10-CM | POA: Insufficient documentation

## 2021-02-04 DIAGNOSIS — Z6839 Body mass index (BMI) 39.0-39.9, adult: Secondary | ICD-10-CM | POA: Diagnosis not present

## 2021-02-04 DIAGNOSIS — M79604 Pain in right leg: Secondary | ICD-10-CM

## 2021-02-04 DIAGNOSIS — E1142 Type 2 diabetes mellitus with diabetic polyneuropathy: Secondary | ICD-10-CM | POA: Insufficient documentation

## 2021-02-04 DIAGNOSIS — L03115 Cellulitis of right lower limb: Secondary | ICD-10-CM | POA: Diagnosis present

## 2021-02-04 DIAGNOSIS — Z885 Allergy status to narcotic agent status: Secondary | ICD-10-CM

## 2021-02-04 DIAGNOSIS — L0390 Cellulitis, unspecified: Secondary | ICD-10-CM | POA: Diagnosis present

## 2021-02-04 DIAGNOSIS — Z79899 Other long term (current) drug therapy: Secondary | ICD-10-CM | POA: Diagnosis not present

## 2021-02-04 DIAGNOSIS — I82511 Chronic embolism and thrombosis of right femoral vein: Secondary | ICD-10-CM | POA: Diagnosis not present

## 2021-02-04 DIAGNOSIS — Z888 Allergy status to other drugs, medicaments and biological substances status: Secondary | ICD-10-CM | POA: Diagnosis not present

## 2021-02-04 DIAGNOSIS — L03119 Cellulitis of unspecified part of limb: Secondary | ICD-10-CM

## 2021-02-04 DIAGNOSIS — I82441 Acute embolism and thrombosis of right tibial vein: Secondary | ICD-10-CM | POA: Diagnosis not present

## 2021-02-04 DIAGNOSIS — I82591 Chronic embolism and thrombosis of other specified deep vein of right lower extremity: Secondary | ICD-10-CM | POA: Diagnosis not present

## 2021-02-04 DIAGNOSIS — Z20822 Contact with and (suspected) exposure to covid-19: Secondary | ICD-10-CM | POA: Diagnosis present

## 2021-02-04 DIAGNOSIS — Z881 Allergy status to other antibiotic agents status: Secondary | ICD-10-CM

## 2021-02-04 DIAGNOSIS — I82409 Acute embolism and thrombosis of unspecified deep veins of unspecified lower extremity: Secondary | ICD-10-CM | POA: Diagnosis not present

## 2021-02-04 DIAGNOSIS — M7989 Other specified soft tissue disorders: Secondary | ICD-10-CM

## 2021-02-04 DIAGNOSIS — I82491 Acute embolism and thrombosis of other specified deep vein of right lower extremity: Secondary | ICD-10-CM

## 2021-02-04 DIAGNOSIS — E1165 Type 2 diabetes mellitus with hyperglycemia: Secondary | ICD-10-CM | POA: Diagnosis present

## 2021-02-04 DIAGNOSIS — Z794 Long term (current) use of insulin: Secondary | ICD-10-CM

## 2021-02-04 DIAGNOSIS — I82501 Chronic embolism and thrombosis of unspecified deep veins of right lower extremity: Secondary | ICD-10-CM | POA: Diagnosis not present

## 2021-02-04 LAB — CBC WITH DIFFERENTIAL/PLATELET
Abs Immature Granulocytes: 0.03 10*3/uL (ref 0.00–0.07)
Basophils Absolute: 0 10*3/uL (ref 0.0–0.1)
Basophils Relative: 1 %
Eosinophils Absolute: 0.2 10*3/uL (ref 0.0–0.5)
Eosinophils Relative: 2 %
HCT: 39.1 % (ref 36.0–46.0)
Hemoglobin: 12.2 g/dL (ref 12.0–15.0)
Immature Granulocytes: 0 %
Lymphocytes Relative: 45 %
Lymphs Abs: 3.6 10*3/uL (ref 0.7–4.0)
MCH: 24.3 pg — ABNORMAL LOW (ref 26.0–34.0)
MCHC: 31.2 g/dL (ref 30.0–36.0)
MCV: 77.7 fL — ABNORMAL LOW (ref 80.0–100.0)
Monocytes Absolute: 0.5 10*3/uL (ref 0.1–1.0)
Monocytes Relative: 6 %
Neutro Abs: 3.7 10*3/uL (ref 1.7–7.7)
Neutrophils Relative %: 46 %
Platelets: 375 10*3/uL (ref 150–400)
RBC: 5.03 MIL/uL (ref 3.87–5.11)
RDW: 14.1 % (ref 11.5–15.5)
WBC: 8.1 10*3/uL (ref 4.0–10.5)
nRBC: 0 % (ref 0.0–0.2)

## 2021-02-04 LAB — BASIC METABOLIC PANEL
Anion gap: 5 (ref 5–15)
BUN: 11 mg/dL (ref 6–20)
CO2: 26 mmol/L (ref 22–32)
Calcium: 8.7 mg/dL — ABNORMAL LOW (ref 8.9–10.3)
Chloride: 104 mmol/L (ref 98–111)
Creatinine, Ser: 0.73 mg/dL (ref 0.44–1.00)
GFR, Estimated: >60 mL/min (ref >60)
Glucose, Bld: 282 mg/dL — ABNORMAL HIGH (ref 70–99)
Potassium: 4.2 mmol/L (ref 3.5–5.1)
Sodium: 135 mmol/L (ref 135–145)

## 2021-02-04 LAB — APTT: aPTT: 26 seconds (ref 24–36)

## 2021-02-04 LAB — LACTIC ACID, PLASMA
Lactic Acid, Venous: 1.1 mmol/L (ref 0.5–1.9)
Lactic Acid, Venous: 1.3 mmol/L (ref 0.5–1.9)

## 2021-02-04 LAB — RESP PANEL BY RT-PCR (FLU A&B, COVID) ARPGX2
Influenza A by PCR: NEGATIVE
Influenza B by PCR: NEGATIVE
SARS Coronavirus 2 by RT PCR: NEGATIVE

## 2021-02-04 LAB — HEPARIN LEVEL (UNFRACTIONATED): Heparin Unfractionated: >1.1 IU/mL — ABNORMAL HIGH (ref 0.30–0.70)

## 2021-02-04 LAB — SEDIMENTATION RATE: Sed Rate: 21 mm/hr (ref 0–22)

## 2021-02-04 LAB — CBG MONITORING, ED: Glucose-Capillary: 165 mg/dL — ABNORMAL HIGH (ref 70–99)

## 2021-02-04 LAB — C-REACTIVE PROTEIN: CRP: 0.8 mg/dL (ref <1.0)

## 2021-02-04 MED ORDER — CLINDAMYCIN PHOSPHATE 600 MG/50ML IV SOLN
600.0000 mg | Freq: Three times a day (TID) | INTRAVENOUS | Status: DC
  Administered 2021-02-05 (×2): 600 mg via INTRAVENOUS
  Filled 2021-02-04 (×3): qty 50

## 2021-02-04 MED ORDER — CIPROFLOXACIN HCL 500 MG PO TABS
500.0000 mg | ORAL_TABLET | Freq: Once | ORAL | Status: AC
  Administered 2021-02-04: 500 mg via ORAL
  Filled 2021-02-04: qty 1

## 2021-02-04 MED ORDER — HEPARIN (PORCINE) 25000 UT/250ML-% IV SOLN
1800.0000 [IU]/h | INTRAVENOUS | Status: DC
  Administered 2021-02-04: 1300 [IU]/h via INTRAVENOUS
  Administered 2021-02-05: 1600 [IU]/h via INTRAVENOUS
  Administered 2021-02-06: 1800 [IU]/h via INTRAVENOUS
  Filled 2021-02-04 (×3): qty 250

## 2021-02-04 MED ORDER — INSULIN ASPART 100 UNIT/ML IJ SOLN
4.0000 [IU] | Freq: Three times a day (TID) | INTRAMUSCULAR | Status: DC
  Administered 2021-02-05 – 2021-02-06 (×3): 4 [IU] via SUBCUTANEOUS
  Filled 2021-02-04: qty 0.04

## 2021-02-04 MED ORDER — HYDROCODONE-ACETAMINOPHEN 5-325 MG PO TABS
1.0000 | ORAL_TABLET | Freq: Once | ORAL | Status: AC
  Administered 2021-02-04: 1 via ORAL

## 2021-02-04 MED ORDER — INSULIN ASPART 100 UNIT/ML IJ SOLN
0.0000 [IU] | Freq: Three times a day (TID) | INTRAMUSCULAR | Status: DC
  Administered 2021-02-05 (×2): 5 [IU] via SUBCUTANEOUS
  Filled 2021-02-04: qty 0.15

## 2021-02-04 MED ORDER — CLINDAMYCIN PHOSPHATE 600 MG/50ML IV SOLN
600.0000 mg | Freq: Once | INTRAVENOUS | Status: AC
  Administered 2021-02-04: 600 mg via INTRAVENOUS
  Filled 2021-02-04: qty 50

## 2021-02-04 MED ORDER — HYDROCODONE-ACETAMINOPHEN 5-325 MG PO TABS
ORAL_TABLET | ORAL | Status: AC
  Filled 2021-02-04: qty 1

## 2021-02-04 NOTE — ED Triage Notes (Addendum)
[facility] brought in from [facility].   C/o right leg swelling and weeping.    Had DVTs and PE 2 weeks ago. On xarelto. Swelling started again this past Tuesday.    A/Ox4 Ambulatory in triage   Pt given 1 hydrocodone at cancer center.

## 2021-02-04 NOTE — Progress Notes (Signed)
RLE venous duplex has been completed.  Preliminary results given to PA.   Results can be found under chart review under CV PROC. 02/04/2021 4:34 PM Neda Willenbring RVT, RDMS

## 2021-02-04 NOTE — ED Notes (Signed)
US bedside

## 2021-02-04 NOTE — Progress Notes (Signed)
1223 Hydrocodone 5/325 1 tablet po given to patient for pain in her right leg. Pt has had sever itching from opxycodone and tramadol in the past. She knows to let this RN know if she develops any itching with hydrocodone.   1:40 pm Patient transferred to ED via w/c for worsening swelling and pain in her right leg. Pt can barely walk on it at this time. Pain is relieved somewhat with Hydrocodone. Only very very mild itching at this time. Pt voices understanding for the need to go to the ED.  Report given to [facility], RN in triage area.  Charge nurse made aware of patient per Georga Kaufmann, PA

## 2021-02-04 NOTE — Progress Notes (Signed)
[place] [phone]   [phone]  INITIAL CONSULT NOTE  Patient Care Team: Pcp, No as PCP - General  Hematological/Oncological History 1)2016: Diagnosed with pulmonary emboli and completed 6 months of anticoagulation. Initially treated with Eliquis and then switched to Pradaxa due to insurance coverage.   2)01/18/2021: CT chest was positive for bilateral pulmonary emboli. Doppler US was positive for right leg DVT. Vascular surgery did not recommend thrombolysis. Patient was discharged on Lovenox (could not afford copay for Xarelto or Eliqius).   3) 01/19/2021: Hypercoagulable workup was unremarkable except for mild increase in beta-2 glycoprotein IgA 40.   4) 01/22/2021: Switched to Xarelto since patient cannot afford copay for Lovenox. Given 30 day free Xarelto coupon card.   5)02/04/2021: Establish care with Dede Query PA-C  CHIEF COMPLAINTS/PURPOSE OF CONSULTATION:  "Recurrent PE/DVT "  HISTORY OF PRESENTING ILLNESS:  Shannon Oneal 40 y.o. female with medical history significant for asthma and diabetes. Patient is unaccompanied for this visit.   On exam today, Ms. Andrew reports that her right lower extremity edema and pain improved shortly after hospital discharge. However, since last Tuesday, she has noticed progressive swelling and pain in the right lower extremity. The pain and swelling starts at the knee and extends to the toes. She rates the pain as 6-7 out of 10 on a pain scale.She has tried tylenol without improvement of pain. She was prescribed oxycodone-acetaminophen and oxycodone that did improve the pain but caused itching. She has difficulty walking and bending her right foot. She does not notice improvement of swelling with elevation of legs. She has noticed some discoloration in her toes in the right foot. Patient has baseline peripheral neuropathy secondary to diabetes that has not worsened. Patient notes that her energy levels are  stable. She works from home but tries to ambulate throughout the house. She denies any appetite changes. Patient denies any nausea, vomiting or abdominal pain. Her bowel movements are regular without any diarrhea or constipation. She denies easy bruising or signs of bleeding. She reports improvement of shortness of breath since initiating anticoagulation. She denies any fevers, chills, night sweats, chest pain or cough. She has no other complaints.   MEDICAL HISTORY:  Past Medical History:  Diagnosis Date   Asthma    Diabetes mellitus without complication ([place])    PE (pulmonary thromboembolism) ([place]) 2016    SURGICAL HISTORY: No past surgical history on file.  SOCIAL HISTORY: Social History   Socioeconomic History   Marital status: Married    Spouse name: Not on file   Number of children: Not on file   Years of education: Not on file   Highest education level: Not on file  Occupational History   Not on file  Tobacco Use   Smoking status: Never   Smokeless tobacco: Never  Vaping Use   Vaping Use: Former   Substances: CBD  Substance and Sexual Activity   Alcohol use: Yes   Drug use: Never   Sexual activity: Not on file  Other Topics Concern   Not on file  Social History Narrative   Not on file   Social Determinants of Health   Financial Resource Strain: Not on file  Food Insecurity: Not on file  Transportation Needs: Not on file  Physical Activity: Not on file  Stress: Not on file  Social Connections: Not on file  Intimate Partner Violence: Not on file    FAMILY HISTORY: Family History  Problem Relation Age of Onset  Deep vein thrombosis Father     ALLERGIES:  is allergic to oxycodone, percocet [oxycodone-acetaminophen], amoxapine and related, amoxicillin, tramadol, and rocephin [ceftriaxone].  MEDICATIONS:  Current Facility-Administered Medications  Medication Dose Route Frequency Provider Last Rate Last Admin   rivaroxaban [name]) Education Kit for  DVT/PE patients   Does not apply Once Dessa Phi, DO       Current Outpatient Medications  Medication Sig Dispense Refill   diphenhydrAMINE (BENADRYL) 25 mg capsule Take 1 capsule (25 mg total) by mouth every 6 (six) hours as needed for itching. (Patient not taking: No sig reported) 30 capsule 0   enoxaparin (LOVENOX) 120 MG/0.8ML injection Inject 0.74 mLs (110 mg total) into the skin 2 (two) times daily. (Patient not taking: No sig reported) 44.4 mL 2   insulin aspart (NOVOLOG) 100 UNIT/ML FlexPen Check your blood sugar prior to your meal, three times daily. Then depending on your blood sugar, inject insulin as follows: Blood Glucose 121 - 150: 1 unit. BG 151 - 200: 2 units. BG 201 - 250: 3 units. BG 251 - 300: 5 units. BG 301 - 350: 7 units. BG 351 - 400: 9 units (Patient taking differently: Inject 1-9 Units into the skin 2 (two) times daily. Check your blood sugar prior to your meal, three times daily. Then depending on your blood sugar, inject insulin as follows: Blood Glucose 121 - 150: 1 unit. BG 151 - 200: 2 units. BG 201 - 250: 3 units. BG 251 - 300: 5 units. BG 301 - 350: 7 units. BG 351 - 400: 9 units) 15 mL 11   Insulin Pen Needle 31G X 5 MM MISC Three times daily 90 each 0   mupirocin cream (BACTROBAN) 2 % Apply 1 application topically 2 (two) times daily. Apply to rash on legs 15 g 0   RIVAROXABAN (XARELTO) VTE STARTER PACK (15 & 20 MG) Follow package directions: Take one 3m tablet by mouth twice a day. On day 22, switch to one 257mtablet once a day. Take with food. (Patient taking differently: Take 15 mg by mouth 2 (two) times daily. Follow package directions: Take one 1542mablet by mouth twice a day. On day 22, switch to one 46m6mblet once a day. Take with food.) 51 each 0   TOUJEO SOLOSTAR 300 UNIT/ML Solostar Pen Inject 30 Units into the skin in the morning and at bedtime. 1.5 mL 3   Facility-Administered Medications Ordered in Other Visits  Medication Dose Route Frequency  Provider Last Rate Last Admin   [START ON 02/05/2021] clindamycin (CLEOCIN) IVPB 600 mg  600 mg Intravenous Q8H MathGeorgette Shell       heparin ADULT infusion 100 units/mL (25000 units/250mL23m,300 Units/hr Intravenous Continuous GreenMinda Ditto 13 mL/hr at 02/04/21 2211 1,300 Units/hr at 02/04/21 2211   [START ON 02/05/2021] insulin aspart (novoLOG) injection 0-15 Units  0-15 Units Subcutaneous TID WC MatheGeorgette Shell      [START ON 02/05/2021] insulin aspart (novoLOG) injection 4 Units  4 Units Subcutaneous TID WC MatheGeorgette Shell       REVIEW OF SYSTEMS:   Constitutional: ( - ) fevers, ( - )  chills , ( - ) night sweats Eyes: ( - ) blurriness of vision, ( - ) double vision, ( - ) watery eyes Ears, nose, mouth, throat, and face: ( - ) mucositis, ( - ) sore throat Respiratory: ( - ) cough, ( - ) dyspnea, ( - )  wheezes Cardiovascular: ( - ) palpitation, ( - ) chest discomfort, ( +) lower extremity swelling Gastrointestinal:  ( - ) nausea, ( - ) heartburn, ( - ) change in bowel habits Skin: ( - ) abnormal skin rashes Lymphatics: ( - ) new lymphadenopathy, ( - ) easy bruising Neurological: ( - ) numbness, ( - ) tingling, ( - ) new weaknesses Behavioral/Psych: ( - ) mood change, ( - ) new changes  All other systems were reviewed with the patient and are negative.  PHYSICAL EXAMINATION: ECOG PERFORMANCE STATUS: 1 - Symptomatic but completely ambulatory  Vitals:   02/04/21 1055  BP: 131/86  Pulse: 86  Resp: 18  Temp: 98 F (36.7 C)  SpO2: 100%   Filed Weights   02/04/21 1055  Weight: 235 lb (106.6 kg)    GENERAL: well appearing African American female in NAD  SKIN: skin color, texture, turgor are normal, no rashes or significant lesions EYES: conjunctiva are pink and non-injected, sclera clear OROPHARYNX: no exudate, no erythema; lips, buccal mucosa, and tongue normal  LYMPH:  no palpable lymphadenopathy in the cervical or supraclavicular lymph nodes.   LUNGS: clear to auscultation and percussion with normal breathing effort HEART: regular rate & rhythm and no murmurs and no lower extremity edema ABDOMEN: soft, non-tender, non-distended, normal bowel sounds Musculoskeletal: no cyanosis of digits and no clubbing. Right lower extremity 2+ pitting edema from knee to foot. Very tender to palpation. Mild darkening pigmentation of the right toes. Open blisters noted in right lower leg and left upper thigh.  PSYCH: alert & oriented x 3, fluent speech NEURO: no focal motor/sensory deficits  LABORATORY DATA:  I have reviewed the data as listed CBC Latest Ref Rng & Units 02/04/2021 01/20/2021 01/19/2021  WBC 4.0 - 10.5 K/uL 8.1 8.9 8.8  Hemoglobin 12.0 - 15.0 g/dL 12.2 13.8 14.5  Hematocrit 36.0 - 46.0 % 39.1 43.4 46.6(H)  Platelets 150 - 400 K/uL 375 218 229    CMP Latest Ref Rng & Units 02/04/2021 01/19/2021 01/18/2021  Glucose 70 - 99 mg/dL 282(H) 312(H) 358(H)  BUN 6 - 20 mg/dL _0 Creatinine 0.44 - 1.00 mg/dL 0.73 0.81 1.02(H)  Sodium 135 - 145 mmol/L 135 134(L) 133(L)  Potassium 3.5 - 5.1 mmol/L 4.2 4.0 4.2  Chloride 98 - 111 mmol/L 104 102 102  CO2 22 - 32 mmol/L _1 Calcium 8.9 - 10.3 mg/dL 8.7(L) 9.3 9.2  Total Protein 6.0 - 8.3 g/dL - - -  Total Bilirubin 0.3 - 1.2 mg/dL - - -  Alkaline Phos 39 - 117 U/L - - -  AST 0 - 37 U/L - - -  ALT 0 - 35 U/L - - -   RADIOGRAPHIC STUDIES: I have personally reviewed the radiological images as listed and agreed with the findings in the report. CT Angio Chest PE W and/or Wo Contrast  Result Date: 01/18/2021 CLINICAL DATA:  PE suspected, low/intermediate prob, positive D-dimer dvt positive EXAM: CT ANGIOGRAPHY CHEST WITH CONTRAST TECHNIQUE: Multidetector CT imaging of the chest was performed using the standard protocol during bolus administration of intravenous contrast. Multiplanar CT image reconstructions and MIPs were obtained to evaluate the vascular anatomy. CONTRAST:  13m  OMNIPAQUE IOHEXOL 350 MG/ML SOLN COMPARISON:  None. FINDINGS: Cardiovascular: There are bilateral central pulmonary emboli in the distal right and left branch pulmonary arteries. Emboli extend into into the lobar, segmental, and subsegmental branches of the lower lobes bilaterally. There is similar extension in the  the upper lobes but to a lesser degree than the lower lobes. RV/LV ratio is approximately 1.02. Mediastinum/Nodes: No enlarged mediastinal, hilar, or axillary lymph nodes. Thyroid gland, trachea, and esophagus demonstrate no significant findings. Lungs/Pleura: Lungs are clear. No pleural effusion or pneumothorax. Upper Abdomen: No acute abnormality. Musculoskeletal: No chest wall abnormality. No acute or significant osseous findings. Review of the MIP images confirms the above findings. IMPRESSION: Bilateral central pulmonary emboli in the distal right and left branch pulmonary arteries, extending into the lobar, segmental, and subsegmental branches. Mildly elevated RV/LV ratio suggesting possible right heart strain. Correlate with echocardiography. Positive for acute PE with CT evidence of right heart strain (RV/LV Ratio = 1.02) consistent with at least submassive (intermediate risk) PE. The presence of right heart strain has been associated with an increased risk of morbidity and mortality. Please refer to the "PE Focused" order set in EPIC. Critical Value/emergent results were called by telephone at the time of interpretation on 01/18/2021 at 9:38 pm to provider [facility] , who verbally acknowledged these results. Electronically Signed   By: Maurine Simmering   On: 01/18/2021 21:41   DG Knee Complete 4 Views Left  Result Date: 01/18/2021 CLINICAL DATA:  Knee pain EXAM: LEFT KNEE - COMPLETE 4+ VIEW COMPARISON:  None. FINDINGS: No fracture or malalignment. Mild to moderate medial joint space degenerative change. No sizable knee effusion IMPRESSION: Degenerative changes.  No acute osseous abnormality  Electronically Signed   By: Donavan Foil M.D.   On: 01/18/2021 18:24   DG Knee Complete 4 Views Right  Result Date: 01/18/2021 CLINICAL DATA:  Knee pain EXAM: RIGHT KNEE - COMPLETE 4+ VIEW COMPARISON:  03/26/2016 FINDINGS: No fracture or malalignment. Mild medial and patellofemoral degenerative changes. No substantial knee effusion IMPRESSION: Mild arthritis.  No acute osseous abnormality Electronically Signed   By: Donavan Foil M.D.   On: 01/18/2021 18:24   ECHOCARDIOGRAM COMPLETE  Result Date: 01/19/2021    ECHOCARDIOGRAM REPORT   Patient Name:   Shannon Oneal Date of Exam: 01/19/2021 Medical Rec #:  [id number]   Height:       65.0 in Accession #:    [id number]  Weight:       239.0 lb Date of Birth:  Mar 11, 1981  BSA:          2.134 m Patient Age:    65 years    BP:           128/76 mmHg Patient Gender: F           HR:           88 bpm. Exam Location:  Inpatient Procedure: 2D Echo, 3D Echo, Cardiac Doppler and Color Doppler Indications:    I26.02 Pulmonary embolus  History:        Patient has no prior history of Echocardiogram examinations.                 Abnormal ECG, Arrythmias:Tachycardia, Signs/Symptoms:Shortness                 of Breath and Dyspnea; Risk Factors:Diabetes.  Sonographer:    Roseanna Rainbow RDCS Referring Phys: [id number] [place]  Sonographer Comments: Image acquisition challenging due to patient body habitus. IMPRESSIONS  1. Left ventricular ejection fraction, by estimation, is 55 to 60%. Left ventricular ejection fraction by 3D volume is 58 %. The left ventricle has normal function. The left ventricle has no regional wall motion abnormalities. There is mild left ventricular hypertrophy. Left ventricular diastolic  parameters were normal.  2. Right ventricular systolic function is normal. The right ventricular size is normal. Tricuspid regurgitation signal is inadequate for assessing PA pressure.  3. The mitral valve is normal in structure. Trivial mitral valve regurgitation. No evidence  of mitral stenosis.  4. The aortic valve is normal in structure. Aortic valve regurgitation is not visualized. No aortic stenosis is present.  5. The inferior vena cava is normal in size with greater than 50% respiratory variability, suggesting right atrial pressure of 3 mmHg. FINDINGS  Left Ventricle: Left ventricular ejection fraction, by estimation, is 55 to 60%. Left ventricular ejection fraction by 3D volume is 58 %. The left ventricle has normal function. The left ventricle has no regional wall motion abnormalities. The left ventricular internal cavity size was normal in size. There is mild left ventricular hypertrophy. Left ventricular diastolic parameters were normal. Right Ventricle: The right ventricular size is normal. No increase in right ventricular wall thickness. Right ventricular systolic function is normal. Tricuspid regurgitation signal is inadequate for assessing PA pressure. Left Atrium: Left atrial size was normal in size. Right Atrium: Right atrial size was normal in size. Pericardium: There is no evidence of pericardial effusion. Presence of pericardial fat pad. Mitral Valve: The mitral valve is normal in structure. Trivial mitral valve regurgitation. No evidence of mitral valve stenosis. Tricuspid Valve: The tricuspid valve is normal in structure. Tricuspid valve regurgitation is trivial. No evidence of tricuspid stenosis. Aortic Valve: The aortic valve is normal in structure. Aortic valve regurgitation is not visualized. No aortic stenosis is present. Pulmonic Valve: The pulmonic valve was normal in structure. Pulmonic valve regurgitation is not visualized. No evidence of pulmonic stenosis. Aorta: The aortic root is normal in size and structure. Venous: The inferior vena cava is normal in size with greater than 50% respiratory variability, suggesting right atrial pressure of 3 mmHg. IAS/Shunts: There is redundancy of the interatrial septum. No atrial level shunt detected by color flow  Doppler.  LEFT VENTRICLE PLAX 2D LVIDd:         3.23 cm         Diastology LVIDs:         2.30 cm         LV e' medial:    7.07 cm/s LV PW:         1.20 cm         LV E/e' medial:  10.6 LV IVS:        1.23 cm         LV e' lateral:   10.00 cm/s LVOT diam:     2.00 cm         LV E/e' lateral: 7.5 LV SV:         61 LV SV Index:   28 LVOT Area:     3.14 cm        3D Volume EF                                LV 3D EF:    Left                                             ventricular LV Volumes (MOD)  ejection LV vol d, MOD    48.6 ml                    fraction by A2C:                                        3D volume LV vol d, MOD    61.2 ml                    is 58 %. A4C: LV vol s, MOD    18.4 ml A2C:                           3D Volume EF: LV vol s, MOD    22.8 ml       3D EF:        58 % A4C: LV SV MOD A2C:   30.2 ml LV SV MOD A4C:   61.2 ml LV SV MOD BP:    34.0 ml RIGHT VENTRICLE             IVC RV S prime:     13.60 cm/s  IVC diam: 1.30 cm TAPSE (M-mode): 2.2 cm LEFT ATRIUM             Index       RIGHT ATRIUM          Index LA diam:        3.40 cm 1.59 cm/m  RA Area:     9.30 cm LA Vol (A2C):   20.3 ml 9.51 ml/m  RA Volume:   17.20 ml 8.06 ml/m LA Vol (A4C):   24.3 ml 11.39 ml/m LA Biplane Vol: 22.7 ml 10.64 ml/m  AORTIC VALVE LVOT Vmax:   128.00 cm/s LVOT Vmean:  80.200 cm/s LVOT VTI:    0.193 m  AORTA Ao Root diam: 2.70 cm Ao Asc diam:  2.70 cm MITRAL VALVE MV Area (PHT): 3.88 cm    SHUNTS MV Decel Time: 196 msec    Systemic VTI:  0.19 m MV E velocity: 74.80 cm/s  Systemic Diam: 2.00 cm MV A velocity: 73.65 cm/s MV E/A ratio:  1.02 Cherlynn Kaiser MD Electronically signed by Cherlynn Kaiser MD Signature Date/Time: 01/19/2021/5:39:54 PM    Final    VAS [place] LOWER EXTREMITY VENOUS (DVT) (ONLY [facility] & [facility])  Result Date: 02/04/2021  Lower Venous DVT Study Patient Name:  Shannon Oneal  Date of Exam:   02/04/2021 Medical Rec #: [id number]    Accession #:    [id number] Date of Birth:  1981/05/13   Patient Gender: F Patient Age:   65Y Exam Location:  [facility] Procedure:      VAS [place] LOWER EXTREMITY VENOUS (DVT) Referring Phys: [id number] ELIZABETH REES --------------------------------------------------------------------------------  Indications: Increased swelling to RLE.  Risk Factors: 01/18/21 DVT 01/18/21 RLE. Comparison Study: 01/18/21 - Positive extensive DVT RLE Performing Technologist: Jody Hill RVT, RDMS  Examination Guidelines: A complete evaluation includes B-mode imaging, spectral Doppler, color Doppler, and power Doppler as needed of all accessible portions of each vessel. Bilateral testing is considered an integral part of a complete examination. Limited examinations for reoccurring indications may be performed as noted. The reflux portion of the exam is performed with the patient in reverse Trendelenburg.  +---------+---------------+---------+-----------+----------+-------------------+ RIGHT    CompressibilityPhasicitySpontaneityPropertiesThrombus Aging      +---------+---------------+---------+-----------+----------+-------------------+  CFV      Full           Yes      Yes                                      +---------+---------------+---------+-----------+----------+-------------------+ SFJ      Full           Yes      Yes                                      +---------+---------------+---------+-----------+----------+-------------------+ FV Prox  Full           Yes      Yes                                      +---------+---------------+---------+-----------+----------+-------------------+ FV Mid   None           No       No                   Chronic             +---------+---------------+---------+-----------+----------+-------------------+ FV DistalNone           No       No                   Chronic             +---------+---------------+---------+-----------+----------+-------------------+ PFV      Full                                                              +---------+---------------+---------+-----------+----------+-------------------+ POP      None           No       No                   Chronic             +---------+---------------+---------+-----------+----------+-------------------+ PTV      Partial        No       No                   Chronic - one of                                                          paired PTVs         +---------+---------------+---------+-----------+----------+-------------------+ PERO     Full                                                             +---------+---------------+---------+-----------+----------+-------------------+ Gastroc  None  No       No                   Chronic             +---------+---------------+---------+-----------+----------+-------------------+ SSV      None           No       No                   Chronic - proximal                                                        SSV / partial MID   +---------+---------------+---------+-----------+----------+-------------------+   +----+---------------+---------+-----------+----------+--------------+ LEFTCompressibilityPhasicitySpontaneityPropertiesThrombus Aging +----+---------------+---------+-----------+----------+--------------+ CFV Full           Yes      Yes                                 +----+---------------+---------+-----------+----------+--------------+     Summary: RIGHT: - Findings consistent with chronic deep vein thrombosis involving the right femoral vein, right popliteal vein, right posterior tibial veins, and right gastrocnemius veins. - Findings consistent with chronic superficial vein thrombosis involving the right small saphenous vein. - No cystic structure found in the popliteal fossa. - Ultrasound characteristics of enlarged lymph nodes are noted in the groin.  LEFT: - No evidence of common femoral vein obstruction. - Ultrasound  characteristics of enlarged lymph nodes noted in the groin.  *See table(s) above for measurements and observations. Electronically signed by Monica Martinez MD on 02/04/2021 at 6:40:08 PM.    Final    VAS [place] LOWER EXTREMITY VENOUS (DVT) (ONLY [facility] & [facility] 7a-7p)  Result Date: 01/19/2021  Lower Venous DVT Study Patient Name:  Shannon Oneal  Date of Exam:   01/18/2021 Medical Rec #: [id number]    Accession #:    [id number] Date of Birth: 06-May-1981   Patient Gender: F Patient Age:   16Y Exam Location:  [facility] Procedure:      VAS [place] LOWER EXTREMITY VENOUS (DVT) Referring Phys: [id number] PETER C Thornton --------------------------------------------------------------------------------  Indications: SOB, and RLE - pain and numbness.  Risk Factors: PE 2016. Comparison Study: No previous exams Performing Technologist: Jody Hill RVT, RDMS  Examination Guidelines: A complete evaluation includes B-mode imaging, spectral Doppler, color Doppler, and power Doppler as needed of all accessible portions of each vessel. Bilateral testing is considered an integral part of a complete examination. Limited examinations for reoccurring indications may be performed as noted. The reflux portion of the exam is performed with the patient in reverse Trendelenburg.  +---------+---------------+---------+-----------+----------+-------------------+ RIGHT    CompressibilityPhasicitySpontaneityPropertiesThrombus Aging      +---------+---------------+---------+-----------+----------+-------------------+ CFV      Full           Yes      Yes                                      +---------+---------------+---------+-----------+----------+-------------------+ SFJ      Full                                                             +---------+---------------+---------+-----------+----------+-------------------+  FV Prox  Full           Yes      Yes                                       +---------+---------------+---------+-----------+----------+-------------------+ FV Mid   None           No       No                   Acute               +---------+---------------+---------+-----------+----------+-------------------+ FV DistalNone           No       No                   Acute               +---------+---------------+---------+-----------+----------+-------------------+ PFV      Full                                                             +---------+---------------+---------+-----------+----------+-------------------+ POP      None           No       No                   Acute               +---------+---------------+---------+-----------+----------+-------------------+ PTV      None           No       No                   Acute               +---------+---------------+---------+-----------+----------+-------------------+ PERO                                                  Not visualized on                                                         this exam           +---------+---------------+---------+-----------+----------+-------------------+ Gastroc  None           No       No                   Acute               +---------+---------------+---------+-----------+----------+-------------------+ SSV      None           No       No                   Acute               +---------+---------------+---------+-----------+----------+-------------------+   +---------+---------------+---------+-----------+----------+--------------+ LEFT  CompressibilityPhasicitySpontaneityPropertiesThrombus Aging +---------+---------------+---------+-----------+----------+--------------+ CFV      Full           Yes      Yes                                 +---------+---------------+---------+-----------+----------+--------------+ SFJ      Full                                                         +---------+---------------+---------+-----------+----------+--------------+ FV Prox  Full           Yes      Yes                                 +---------+---------------+---------+-----------+----------+--------------+ FV Mid   Full           Yes      Yes                                 +---------+---------------+---------+-----------+----------+--------------+ FV DistalFull           Yes      Yes                                 +---------+---------------+---------+-----------+----------+--------------+ PFV      Full                                                        +---------+---------------+---------+-----------+----------+--------------+ POP      Full           Yes      Yes                                 +---------+---------------+---------+-----------+----------+--------------+ PTV      Full                                                        +---------+---------------+---------+-----------+----------+--------------+ PERO     Full                                                        +---------+---------------+---------+-----------+----------+--------------+     Summary: BILATERAL: -No evidence of popliteal cyst, bilaterally. RIGHT: - Findings consistent with acute deep vein thrombosis involving the right femoral vein, right popliteal vein, right posterior tibial veins, and right gastrocnemius veins. - Findings consistent with acute superficial vein thrombosis involving the right small saphenous vein.  LEFT: - There is no evidence of deep vein thrombosis in the lower extremity. - There is no  evidence of superficial venous thrombosis.  - Ultrasound characteristics of enlarged lymph nodes noted in the groin.  *See table(s) above for measurements and observations. Electronically signed by Deitra Mayo MD on 01/19/2021 at 8:15:17 AM.    Final     ASSESSMENT & PLAN Marvelene Stoneberg is a 40 y.o. 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 Patient is currently on Xarelto starting 01/22/2021 after switching from Lovenox due to high copay. Due to progressive right lower extremity edema and pain while compliant on anticoagulation, we recommend evaluation in the ED. This includes a STAT doppler US and vascular surgery consultation. In addition, we recommend evaluation for infectious process due to underlying diabetes and peripheral neuropathy.   Regarding role for anticoagulation, we recommend indefinite anticoagulation since there is no provoking factor that caused her PE in 2016 and DVT/PE last month. Reviewed hypercoagulable workup that shows mild elevation of beta-glycoprotein IgA. Recommend to repeat antiphospholipid antibody panel in 12 weeks.   #Recurrent DVT/PE: --No provoking factor identified so recommend indefinite anticoagulation.  --Currently on Xarelto (with free 30 day supply card). Will apply for patient assistance program to help with copay.  --Due to progressive edema and pain in the right leg, will send patient to the ED for further evaluation. Recommend repeat STAT doppler US and vascular surgery evaluation. Need to rule out infectious process such as cellulitis.  --Gave 1 tablet of Hydrocodone-Acetaminophen 5-325 mg in clinic.  --Hypercoagulable workup from 01/19/2021 revealed mild beta-2 glycoprotein IgA levels at 40. Recommend to repeat levels in 12 weeks.  --Plan to follow up in clinic after ED/hospital discharge.   Orders Placed This Encounter  Procedures   CBC with Differential ([place] Only)    Standing Status:   Future    Standing Expiration Date:   02/04/2022   CMP ([place] only)    Standing Status:   Future    Standing Expiration Date:   02/04/2022    All questions were answered. The patient knows to call the clinic with any problems, questions or concerns.  I have spent a total of 60 minutes minutes of face-to-face and non-face-to-face time, preparing to see the patient,  obtaining and/or reviewing separately obtained history, performing a medically appropriate examination, counseling and educating the patient, ordering medications, communicating with other health care professionals, documenting clinical information in the electronic health record, and care coordination.   Dede Query, PA-C Department of Hematology/Oncology [place] at [facility] Phone: [phone]  Patient was seen with Dr. Jasmaine Rochel Lim[place].   ADDENDUM  .Patient was Personally and independently interviewed, examined and relevant elements of the history of present illness were reviewed in details and an assessment and plan was created. All elements of the patient's history of present illness , assessment and plan were discussed in details with Dede Query, PA-C. The above documentation reflects our combined findings assessment and plan.   Patient recently discharged from hospital from hospital after having had her 2nd unprovoked episode of extensive rt lower extremity DVT and b/l [place] PE. Notes after some minimal improvement on anticoagulation in the hospital she notes significantly worsening pain and swelling her her rt lower extremity below of knee. Swelling does not improve with leg elevation or overnight with sleep. She notes strict compliance with her loading dose of Xarelto which she currently is on. She has developed some open sores on her leg as well and has cracks in her intertriginous areas of her foot. She  has a diabetic foot with severe neuropathy upto mid leg.  She was sent to ER given her worsening symptoms with concerns to r/o DVT progression vs superadded cellulitis in a patient with leg swelling/diabetes and diabetic foot There is some darkening /color changes in her forefoot. PLAN -sent to ER -will need official vascular surgery consult to evaluate patient for significant worsening rt leg pain to determine options for intervention to help with progressive  symptoms despite anticoagulation. -switch patient to IV heparin and then transition to lovenox on discharge - based on vascular surgery plan. -given uncertain significant of borderline +B2gp IgA antibody - prefer to use Lovenox as opposed to DOAC at this time especially in light of worsening symptoms. -evaluation and rx possible cellulitis, consider MRI foot/leg to r/o deep tissue infection if necessary. -consider CT venogram to better evaluate pelvis venous system.   Sullivan Lone MD MS

## 2021-02-04 NOTE — ED Notes (Signed)
Pt provided with a meal and advised she will be NPO after midnight.

## 2021-02-04 NOTE — ED Provider Notes (Signed)
Emergency Medicine Provider Triage Evaluation Note  Shannon Oneal , a 40 y.o. female  was evaluated in triage.  Pt complains of worsening right lower extremity edema x2 weeks.  Patient was diagnosed with a right DVT and PE and is currently on Xarelto which she has been compliant with.  Patient was seen at the cancer center prior to arrival and sent to the ED due to concerns about infection.  No fever or chills.  Patient was seen in the ED on 7/10 for the same complaint which is thought to be related to impetigo patient was discharged with Bactroban.  Patient denies chest pain and shortness of breath.  Review of Systems  Positive: Lower extremity edema Negative: Fever  Physical Exam  BP (!) 137/95 (BP Location: Right Arm)   Pulse 83   Temp 98 F (36.7 C) (Oral)   Resp 16   SpO2 98%  Gen:   Awake, no distress   Resp:  Normal effort  MSK:   Moves extremities without difficulty  Other:  2+ pitting edema to RLE  Medical Decision Making  Medically screening exam initiated at 1:57 PM.  Appropriate orders placed.  Shannon Oneal was informed that the remainder of the evaluation will be completed by another provider, this initial triage assessment does not replace that evaluation, and the importance of remaining in the ED until their evaluation is complete.  Labs ordered.   [name] 02/04/21 1359    Lorre Nick, MD 02/05/21 [phone]

## 2021-02-04 NOTE — ED Provider Notes (Signed)
[facility]-EMERGENCY DEPT Provider Note   CSN: [id number] Arrival date & time: 02/04/21  1334     History Chief Complaint  Patient presents with   Leg Swelling    Shannon Oneal is a 40 y.o. female.  The history is provided by the patient and medical records.  Shannon Oneal is a 40 y.o. female who presents to the Emergency Department complaining of leg edema. She presents to the emergency department upon referral from the hematology's office for evaluation of progressive lower extremity edema. She has a history of DVT and PE and is on Xarelto starter pack at this time. She was discharged about a week and a half ago. She states that initially on her Lovenox bridging she was doing well. She is now on Xarelto in compliance with the medication. Since hospital discharge she reports increased swelling and pain described as 6 to 7/10. The pain is located from the knee down to the foot. She also reports recent discoloration to the toes of the right foot. No fevers, chills, chest pain, difficulty breathing. She was referred to the emergency department for further evaluation of progressive edema. She does work from home and sits in a chair for most the day.    Past Medical History:  Diagnosis Date   Asthma    Diabetes mellitus without complication (HCC)    PE (pulmonary thromboembolism) (HCC) 2016    Patient Active Problem List   Diagnosis Date Noted   Cellulitis 02/04/2021   Cellulitis in diabetic foot (HCC)    Acute pulmonary embolism (HCC) 01/19/2021   Acute pulmonary embolus (HCC) 01/18/2021   Acute deep vein thrombosis (DVT) of right lower extremity (HCC) 01/18/2021   Type 2 diabetes mellitus with hyperglycemia (HCC) 01/18/2021    History reviewed. No pertinent surgical history.   OB History   No obstetric history on file.     Family History  Problem Relation Age of Onset   Deep vein thrombosis Father     Social History   Tobacco Use   Smoking status:  Never   Smokeless tobacco: Never  Vaping Use   Vaping Use: Former   Substances: CBD  Substance Use Topics   Alcohol use: Yes   Drug use: Never    Home Medications Prior to Admission medications   Medication Sig Start Date End Date Taking? Authorizing Provider  insulin aspart (NOVOLOG) 100 UNIT/ML FlexPen Check your blood sugar prior to your meal, three times daily. Then depending on your blood sugar, inject insulin as follows: Blood Glucose 121 - 150: 1 unit. BG 151 - 200: 2 units. BG 201 - 250: 3 units. BG 251 - 300: 5 units. BG 301 - 350: 7 units. BG 351 - 400: 9 units Patient taking differently: Inject 1-9 Units into the skin 2 (two) times daily. Check your blood sugar prior to your meal, three times daily. Then depending on your blood sugar, inject insulin as follows: Blood Glucose 121 - 150: 1 unit. BG 151 - 200: 2 units. BG 201 - 250: 3 units. BG 251 - 300: 5 units. BG 301 - 350: 7 units. BG 351 - 400: 9 units 01/23/21  Yes Noralee Stainhoi, Jennifer, DO  RIVAROXABAN Carlena Hurl(XARELTO) VTE STARTER PACK (15 & 20 MG) Follow package directions: Take one 15mg  tablet by mouth twice a day. On day 22, switch to one 20mg  tablet once a day. Take with food. Patient taking differently: Take 15 mg by mouth 2 (two) times daily. Follow package directions: Take one  15mg  tablet by mouth twice a day. On day 22, switch to one 20mg  tablet once a day. Take with food. 01/23/21  Yes Hong, , MD  TOUJEO SOLOSTAR 300 UNIT/ML Solostar Pen Inject 30 Units into the skin in the morning and at bedtime. 01/22/21 04/22/21 Yes 03/25/21, DO  diphenhydrAMINE (BENADRYL) 25 mg capsule Take 1 capsule (25 mg total) by mouth every 6 (six) hours as needed for itching. Patient not taking: No sig reported 01/22/21   Noralee Stain, DO  enoxaparin (LOVENOX) 120 MG/0.8ML injection Inject 0.74 mLs (110 mg total) into the skin 2 (two) times daily. Patient not taking: No sig reported 01/22/21 04/22/21  03/25/21, DO  Insulin Pen Needle 31G X 5 MM  MISC Three times daily 01/23/21   Noralee Stain, DO  mupirocin cream (BACTROBAN) 2 % Apply 1 application topically 2 (two) times daily. Apply to rash on legs 01/30/21   Noralee Stain, MD  gabapentin (NEURONTIN) 300 MG capsule Take 1 capsule (300 mg total) by mouth 3 (three) times daily. Patient not taking: No sig reported 10/31/19 01/18/21  12/31/19, MD  metoCLOPramide (REGLAN) 10 MG tablet Take 1 tablet (10 mg total) by mouth every 6 (six) hours as needed (Nausea or headache). Patient not taking: Reported on 10/12/2017 02/18/13 10/31/19  02/20/13, MD  promethazine (PHENERGAN) 25 MG tablet Take 1 tablet (25 mg total) by mouth every 6 (six) hours as needed for nausea or vomiting (headache). Patient not taking: No sig reported 06/04/20 01/18/21  13/12/21, MD    Allergies    Oxycodone, Percocet [oxycodone-acetaminophen], Amoxapine and related, Amoxicillin, Tramadol, and Rocephin [ceftriaxone]  Review of Systems   Review of Systems  All other systems reviewed and are negative.  Physical Exam Updated Vital Signs BP 119/85   Pulse 85   Temp 98 F (36.7 C) (Oral)   Resp 17   SpO2 97%   Physical Exam Vitals and nursing note reviewed.  Constitutional:      Appearance: She is well-developed.  HENT:     Head: Normocephalic and atraumatic.  Cardiovascular:     Rate and Rhythm: Normal rate and regular rhythm.     Heart sounds: No murmur heard. Pulmonary:     Effort: Pulmonary effort is normal. No respiratory distress.     Breath sounds: Normal breath sounds.  Abdominal:     Palpations: Abdomen is soft.     Tenderness: There is no abdominal tenderness. There is no guarding or rebound.  Musculoskeletal:        General: Swelling present. No tenderness.     Comments: 2+ left DP pulse. One plus right DP pulse. There is moderate to severe soft tissue swelling to the right calf, ankle, foot. There is decreased range of motion at the right foot. There is ulceration to the upper  shin with a small amount of serous drainage. There is no significant erythema to the leg. There is mild to moderate tenderness to palpation throughout the entire right lower extremity from the knee to the foot.  Skin:    General: Skin is warm and dry.  Neurological:     Mental Status: She is alert and oriented to person, place, and time.  Psychiatric:        Behavior: Behavior normal.    ED Results / Procedures / Treatments   Labs (all labs ordered are listed, but only abnormal results are displayed) Labs Reviewed  CBC WITH DIFFERENTIAL/PLATELET - Abnormal; Notable for the  following components:      Result Value   MCV 77.7 (*)    MCH 24.3 (*)    All other components within normal limits  BASIC METABOLIC PANEL - Abnormal; Notable for the following components:   Glucose, Bld 282 (*)    Calcium 8.7 (*)    All other components within normal limits  HEPARIN LEVEL (UNFRACTIONATED) - Abnormal; Notable for the following components:   Heparin Unfractionated >1.10 (*)    All other components within normal limits  CBG MONITORING, ED - Abnormal; Notable for the following components:   Glucose-Capillary 165 (*)    All other components within normal limits  RESP PANEL BY RT-PCR (FLU A&B, COVID) ARPGX2  CULTURE, BLOOD (ROUTINE X 2)  CULTURE, BLOOD (ROUTINE X 2)  LACTIC ACID, PLASMA  LACTIC ACID, PLASMA  SEDIMENTATION RATE  C-REACTIVE PROTEIN  APTT  COMPREHENSIVE METABOLIC PANEL  CBC  HEMOGLOBIN A1C  APTT    EKG None  Radiology VAS [place] LOWER EXTREMITY VENOUS (DVT) (ONLY [facility] & [facility])  Result Date: 02/04/2021  Lower Venous DVT Study Patient Name:  Shannon Oneal  Date of Exam:   02/04/2021 Medical Rec #: [id number]    Accession #:    [id number] Date of Birth: Feb 03, 1981   Patient Gender: F Patient Age:   30Y Exam Location:  [facility] Procedure:      VAS [place] LOWER EXTREMITY VENOUS (DVT) Referring Phys: [id number] Mckay Brandt  --------------------------------------------------------------------------------  Indications: Increased swelling to RLE.  Risk Factors: 01/18/21 DVT 01/18/21 RLE. Comparison Study: 01/18/21 - Positive extensive DVT RLE Performing Technologist: Jody Hill RVT, RDMS  Examination Guidelines: A complete evaluation includes B-mode imaging, spectral Doppler, color Doppler, and power Doppler as needed of all accessible portions of each vessel. Bilateral testing is considered an integral part of a complete examination. Limited examinations for reoccurring indications may be performed as noted. The reflux portion of the exam is performed with the patient in reverse Trendelenburg.  +---------+---------------+---------+-----------+----------+-------------------+ RIGHT    CompressibilityPhasicitySpontaneityPropertiesThrombus Aging      +---------+---------------+---------+-----------+----------+-------------------+ CFV      Full           Yes      Yes                                      +---------+---------------+---------+-----------+----------+-------------------+ SFJ      Full           Yes      Yes                                      +---------+---------------+---------+-----------+----------+-------------------+ FV Prox  Full           Yes      Yes                                      +---------+---------------+---------+-----------+----------+-------------------+ FV Mid   None           No       No                   Chronic             +---------+---------------+---------+-----------+----------+-------------------+ FV DistalNone  No       No                   Chronic             +---------+---------------+---------+-----------+----------+-------------------+ PFV      Full                                                             +---------+---------------+---------+-----------+----------+-------------------+ POP      None           No       No                    Chronic             +---------+---------------+---------+-----------+----------+-------------------+ PTV      Partial        No       No                   Chronic - one of                                                          paired PTVs         +---------+---------------+---------+-----------+----------+-------------------+ PERO     Full                                                             +---------+---------------+---------+-----------+----------+-------------------+ Gastroc  None           No       No                   Chronic             +---------+---------------+---------+-----------+----------+-------------------+ SSV      None           No       No                   Chronic - proximal                                                        SSV / partial MID   +---------+---------------+---------+-----------+----------+-------------------+   +----+---------------+---------+-----------+----------+--------------+ LEFTCompressibilityPhasicitySpontaneityPropertiesThrombus Aging +----+---------------+---------+-----------+----------+--------------+ CFV Full           Yes      Yes                                 +----+---------------+---------+-----------+----------+--------------+     Summary: RIGHT: - Findings consistent with chronic deep vein thrombosis involving the right femoral vein, right popliteal vein, right posterior tibial veins, and right gastrocnemius veins. - Findings consistent with chronic superficial  vein thrombosis involving the right small saphenous vein. - No cystic structure found in the popliteal fossa. - Ultrasound characteristics of enlarged lymph nodes are noted in the groin.  LEFT: - No evidence of common femoral vein obstruction. - Ultrasound characteristics of enlarged lymph nodes noted in the groin.  *See table(s) above for measurements and observations. Electronically signed by Sherald Hess MD on 02/04/2021 at  6:40:08 PM.    Final     Procedures Procedures   Medications Ordered in ED Medications  clindamycin (CLEOCIN) IVPB 600 mg (has no administration in time range)  insulin aspart (novoLOG) injection 0-15 Units (has no administration in time range)  insulin aspart (novoLOG) injection 4 Units (has no administration in time range)  heparin ADULT infusion 100 units/mL (25000 units/253mL) (1,300 Units/hr Intravenous New Bag/Given 02/04/21 2211)  ciprofloxacin (CIPRO) tablet 500 mg (500 mg Oral Given 02/04/21 1855)  clindamycin (CLEOCIN) IVPB 600 mg (0 mg Intravenous Stopped 02/04/21 1949)    ED Course  I have reviewed the triage vital signs and the nursing notes.  Pertinent labs & imaging results that were available during my care of the patient were reviewed by me and considered in my medical decision making (see chart for details).    MDM Rules/Calculators/A&P                         patient with recent diagnosis of DVT currently on anticoagulation here for evaluation of progressive swelling and pain to the right lower extremity. She does have a history of diabetes and does have some ruptured vesicles, no significant erythema to the right lower extremity. Discussed with Dr. Candise Che with oncology, recommends vascular surgery consult and workup for infection. Discussed with Dr. Chestine Spore with vascular surgery he will see the patient in consult at [facility]. Medicine consulted for admission.  Final Clinical Impression(s) / ED Diagnoses Final diagnoses:  None    Rx / DC Orders ED Discharge Orders     None        Tilden Fossa, MD 02/04/21 2241

## 2021-02-04 NOTE — Progress Notes (Signed)
ANTICOAGULATION CONSULT NOTE   Pharmacy Consult for Heparin Indication: pulmonary embolus and DVT on Xarelto PTA  Allergies  Allergen Reactions   Oxycodone Itching   Percocet [Oxycodone-Acetaminophen] Itching   Amoxapine And Related Other (See Comments)    Burns skin, hair falls out   Amoxicillin Other (See Comments)    Burns skin and hair falls out   Tramadol Itching   Rocephin [Ceftriaxone] Rash    swelling   Patient Measurements:   Heparin Dosing Weight: 82 kg  Vital Signs: Temp: 98 F (36.7 C) (07/15 1340) Temp Source: Oral (07/15 1340) BP: 116/84 (07/15 1900) Pulse Rate: 87 (07/15 1900)  Labs: Recent Labs    02/04/21 1421  HGB 12.2  HCT 39.1  PLT 375  CREATININE 0.73    Estimated Creatinine Clearance: 113.3 mL/min (by C-G formula based on SCr of 0.73 mg/dL).   Medical History: Past Medical History:  Diagnosis Date   Asthma    Diabetes mellitus without complication (HCC)    PE (pulmonary thromboembolism) (HCC) 2016    Medications:  Scheduled:   [START ON 02/05/2021] insulin aspart  0-15 Units Subcutaneous TID WC   [START ON 02/05/2021] insulin aspart  4 Units Subcutaneous TID WC   rivaroxaban   Does not apply Once   Infusions:   [START ON 02/05/2021] clindamycin (CLEOCIN) IV      Assessment: Last Xarelto 15mg  dose was 7/15 at 0930.   6/28 CT chest with bilateral pulmonary embolism and acute right lower extremity DVT.  There was no provoking factor.  At that time vascular surgery was consulted - did not recommend thrombolysis patient was discharged on Lovenox as she could not afford to pay for Xarelto or Eliquis. 7/3: Anti-coagulation changed to Xarelto, starter pack 15mg  bid x 3 weeks, then 20mg  qd 7/10: to ED with worsening RLE swelling, rash. Treated topically for bacterial infection 7/15: to ED from [facility] for worsening RLE edema. ED physician discussed with vascular surgery Dr. today who does not think patient is a surgical  candidate Plan admit to [facility] so vascular surgery can see her in consultation.     Goal of Therapy:  Heparin level 0.3-0.7 units/ml aPTT 66-102 seconds Monitor platelets by anticoagulation protocol: Yes  Today, 02/04/2021 Baseline Heparin level elevated as expected on Xarelto (DOAC will artificially elevate Hep level) Baseline aPTT low as expected Will dose Heparin based on aPTT until levels correlate   Plan:  Heparin infusion at 1300 units/hr No loading dose Check aPTT at 6 hr from start of infusion Daily CBC  Chestine Spore PharmD 02/04/2021,8:26 PM

## 2021-02-04 NOTE — H&P (Addendum)
History and Physical    Shannon Oneal MRN:[id number] DOB: 06/28/1981 DOA: 02/04/2021  PCP: Pcp, No Patient coming from: home  Chief Complaint: leg swelling worse since the last 3 days  HPI: Shannon Oneal is a 39 y.o. female with medical history significant of type 2 diabetes, right lower extremity DVT on Xarelto which she has been taking on a regular basis was in to see oncologist today and patient was sent to the ER from the oncology office with persistent swelling of the right lower extremity in spite of being on Xarelto. Patient came to the ED with similar complaints and was discharged on Bactroban thinking there is some impetigo to the right lower extremity. Her DVT and pulmonary embolism was diagnosed 2 weeks ago, since 3 days ago this week her swelling started to increase in the right lower extremity with weeping. She had PE in 2016 when she completed 6 months of anticoagulation. CT chest last month showed bilateral pulmonary embolism and acute right lower extremity DVT.  There was no provoking factor.  At that time vascular surgery was consulted did not recommend thrombolysis patient was discharged on Lovenox as she could not afford to pay for Xarelto or Eliquis. ED physician discussed with vascular surgery Dr. Clark today who does not think patient is a surgical candidate  I am admitting this patient to [facility] so vascular surgery can see her in consultation.  She is a patient of Dr. Kale. She denies any chest pain she has on and off shortness of breath she is not on oxygen.  She denied fever chills cough.  She denies nausea vomiting diarrhea.  Her last bowel movement was 2 days prior to admission.  She has no urinary complaints.  Her dad had thromboembolism. She did not get COVID infection.  She has taken all the 3 vaccines.    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  Left lower  extremity there is no evidence of DVT.  Review of Systems: As per HPI otherwise all other systems reviewed and are negative  Ambulatory Status: She is ambulatory at baseline and works from home.  Past Medical History:  Diagnosis Date   Asthma    Diabetes mellitus without complication (HCC)    PE (pulmonary thromboembolism) (HCC) 2016    History reviewed. No pertinent surgical history.  Social History   Socioeconomic History   Marital status: Married    Spouse name: Not on file   Number of children: Not on file   Years of education: Not on file   Highest education level: Not on file  Occupational History   Not on file  Tobacco Use   Smoking status: Never   Smokeless tobacco: Never  Vaping Use   Vaping Use: Former   Substances: CBD  Substance and Sexual Activity   Alcohol use: Yes   Drug use: Never   Sexual activity: Not on file  Other Topics Concern   Not on file  Social History Narrative   Not on file   Social Determinants of Health   Financial Resource Strain: Not on file  Food Insecurity: Not on file  Transportation Needs: Not on file  Physical Activity: Not on file  Stress: Not on file  Social Connections: Not on file  Intimate Partner Violence: Not on file    Allergies  Allergen Reactions   Oxycodone Itching   Percocet [Oxycodone-Acetaminophen] Itching   Amoxapine And Related Other (  See Comments)    Burns skin, hair falls out   Amoxicillin Other (See Comments)    Burns skin and hair falls out   Tramadol Itching   Rocephin [Ceftriaxone] Rash    swelling    Family History  Problem Relation Age of Onset   Deep vein thrombosis Father       Prior to Admission medications   Medication Sig Start Date End Date Taking? Authorizing Provider  insulin aspart (NOVOLOG) 100 UNIT/ML FlexPen Check your blood sugar prior to your meal, three times daily. Then depending on your blood sugar, inject insulin as follows: Blood Glucose 121 - 150: 1 unit. BG 151 -  200: 2 units. BG 201 - 250: 3 units. BG 251 - 300: 5 units. BG 301 - 350: 7 units. BG 351 - 400: 9 units Patient taking differently: Inject 1-9 Units into the skin 2 (two) times daily. Check your blood sugar prior to your meal, three times daily. Then depending on your blood sugar, inject insulin as follows: Blood Glucose 121 - 150: 1 unit. BG 151 - 200: 2 units. BG 201 - 250: 3 units. BG 251 - 300: 5 units. BG 301 - 350: 7 units. BG 351 - 400: 9 units 01/23/21  Yes Dessa Phi, DO  RIVAROXABAN [name]) VTE STARTER PACK (15 & 20 MG) Follow package directions: Take one 87m tablet by mouth twice a day. On day 22, switch to one 235mtablet once a day. Take with food. Patient taking differently: Take 15 mg by mouth 2 (two) times daily. Follow package directions: Take one 1529mablet by mouth twice a day. On day 22, switch to one 15m36mblet once a day. Take with food. 01/23/21  Yes Hong, JoshGreggory Brandy  TOUJEO SOLOSTAR 300 UNIT/ML Solostar Pen Inject 30 Units into the skin in the morning and at bedtime. 01/22/21 04/22/21 Yes ChoiDessa Phi  diphenhydrAMINE (BENADRYL) 25 mg capsule Take 1 capsule (25 mg total) by mouth every 6 (six) hours as needed for itching. Patient not taking: No sig reported 01/22/21   ChoiDessa Phi  enoxaparin (LOVENOX) 120 MG/0.8ML injection Inject 0.74 mLs (110 mg total) into the skin 2 (two) times daily. Patient not taking: No sig reported 01/22/21 04/22/21  ChoiDessa Phi  Insulin Pen Needle 31G X 5 MM MISC Three times daily 01/23/21   ChoiDessa Phi  mupirocin cream (BACTROBAN) 2 % Apply 1 application topically 2 (two) times daily. Apply to rash on legs 01/30/21   WentDaleen Bo  gabapentin (NEURONTIN) 300 MG capsule Take 1 capsule (300 mg total) by mouth 3 (three) times daily. Patient not taking: No sig reported 10/31/19 01/18/21  ShelTruddie Hidden  metoCLOPramide (REGLAN) 10 MG tablet Take 1 tablet (10 mg total) by mouth every 6 (six) hours as needed (Nausea or  headache). Patient not taking: Reported on 10/12/2017 7/294/58/59/2/9/24icDelora Fuel  promethazine (PHENERGAN) 25 MG tablet Take 1 tablet (25 mg total) by mouth every 6 (six) hours as needed for nausea or vomiting (headache). Patient not taking: No sig reported 06/04/20 01/18/21  Yao,Drenda Freeze    Physical Exam: Vitals:   02/04/21 1518 02/04/21 1600 02/04/21 1700 02/04/21 1730  BP:  (!) 124/94 112/83 107/77  Pulse:  79 76 80  Resp: _0 Temp:      TempSrc:      SpO2:  100% 100% 100%     General:  Appears calm and  comfortable Eyes:  PERRL, EOMI, normal lids, iris ENT:  grossly normal hearing, lips & tongue, mmm Neck:  no LAD, masses or thyromegaly Cardiovascular:  RRR, no m/r/g. No LE edema.  Respiratory:  CTA bilaterally, no w/r/r. Normal respiratory effort. Abdomen:  soft, ntnd, NABS Skin:  no rash or induration seen on limited exam Extremity 3+ swelling/edema to the right lower extremity pitting  warm to touch Psychiatric:  grossly normal mood and affect, speech fluent and appropriate, AOx3 Neurologic:  CN 2-12 grossly intact, moves all extremities in coordinated fashion, sensation intact  Labs on Admission: I have personally reviewed following labs and imaging studies  CBC: Recent Labs  Lab 02/04/21 1421  WBC 8.1  NEUTROABS 3.7  HGB 12.2  HCT 39.1  MCV 77.7*  PLT 277   Basic Metabolic Panel: Recent Labs  Lab 02/04/21 1421  NA 135  K 4.2  CL 104  CO2 26  GLUCOSE 282*  BUN 11  CREATININE 0.73  CALCIUM 8.7*   GFR: Estimated Creatinine Clearance: 113.3 mL/min (by C-G formula based on SCr of 0.73 mg/dL). Liver Function Tests: No results for input(s): AST, ALT, ALKPHOS, BILITOT, PROT, ALBUMIN in the last 168 hours. No results for input(s): LIPASE, AMYLASE in the last 168 hours. No results for input(s): AMMONIA in the last 168 hours. Coagulation Profile: No results for input(s): INR, PROTIME in the last 168 hours. Cardiac Enzymes: No  results for input(s): CKTOTAL, CKMB, CKMBINDEX, TROPONINI in the last 168 hours. BNP (last 3 results) No results for input(s): PROBNP in the last 8760 hours. HbA1C: No results for input(s): HGBA1C in the last 72 hours. CBG: No results for input(s): GLUCAP in the last 168 hours. Lipid Profile: No results for input(s): CHOL, HDL, LDLCALC, TRIG, CHOLHDL, LDLDIRECT in the last 72 hours. Thyroid Function Tests: No results for input(s): TSH, T4TOTAL, FREET4, T3FREE, THYROIDAB in the last 72 hours. Anemia Panel: No results for input(s): VITAMINB12, FOLATE, FERRITIN, TIBC, IRON, RETICCTPCT in the last 72 hours. Urine analysis:    Component Value Date/Time   COLORURINE YELLOW 06/02/2020 [place] 06/02/2020 1255   LABSPEC 1.018 06/02/2020 1255   PHURINE 5.0 06/02/2020 1255   GLUCOSEU NEGATIVE 06/02/2020 1255   HGBUR NEGATIVE 06/02/2020 1255   BILIRUBINUR NEGATIVE 06/02/2020 1255   KETONESUR 5 (A) 06/02/2020 1255   PROTEINUR NEGATIVE 06/02/2020 1255   NITRITE NEGATIVE 06/02/2020 1255   LEUKOCYTESUR NEGATIVE 06/02/2020 1255    Creatinine Clearance: Estimated Creatinine Clearance: 113.3 mL/min (by C-G formula based on SCr of 0.73 mg/dL).  Sepsis Labs: _0 (procalcitonin:4,lacticidven:4) )No results found for this or any previous visit (from the past 240 hour(s)).   Radiological Exams on Admission: VAS [place] LOWER EXTREMITY VENOUS (DVT) (ONLY [facility] & [facility])  Result Date: 02/04/2021  Lower Venous DVT Study Patient Name:  EMANUELLE HAMMERSTROM  Date of Exam:   02/04/2021 Medical Rec #: [id number]    Accession #:    [id number] Date of Birth: Nov 26, 1980   Patient Gender: F Patient Age:   27Y Exam Location:  [facility] Procedure:      VAS [place] LOWER EXTREMITY VENOUS (DVT) Referring Phys: [place] --------------------------------------------------------------------------------  Indications: Increased swelling to RLE.  Risk Factors: 01/18/21 DVT 01/18/21 RLE. Comparison Study:  01/18/21 - Positive extensive DVT RLE Performing Technologist: Jody Hill RVT, RDMS  Examination Guidelines: A complete evaluation includes B-mode imaging, spectral Doppler, color Doppler, and power Doppler as needed of all accessible portions of each vessel. Bilateral testing is considered an  integral part of a complete examination. Limited examinations for reoccurring indications may be performed as noted. The reflux portion of the exam is performed with the patient in reverse Trendelenburg.  +---------+---------------+---------+-----------+----------+-------------------+ RIGHT    CompressibilityPhasicitySpontaneityPropertiesThrombus Aging      +---------+---------------+---------+-----------+----------+-------------------+ CFV      Full           Yes      Yes                                      +---------+---------------+---------+-----------+----------+-------------------+ SFJ      Full           Yes      Yes                                      +---------+---------------+---------+-----------+----------+-------------------+ FV Prox  Full           Yes      Yes                                      +---------+---------------+---------+-----------+----------+-------------------+ FV Mid   None           No       No                   Chronic             +---------+---------------+---------+-----------+----------+-------------------+ FV DistalNone           No       No                   Chronic             +---------+---------------+---------+-----------+----------+-------------------+ PFV      Full                                                             +---------+---------------+---------+-----------+----------+-------------------+ POP      None           No       No                   Chronic             +---------+---------------+---------+-----------+----------+-------------------+ PTV      Partial        No       No                   Chronic - one  of                                                          paired PTVs         +---------+---------------+---------+-----------+----------+-------------------+ PERO     Full                                                             +---------+---------------+---------+-----------+----------+-------------------+  Gastroc  None           No       No                   Chronic             +---------+---------------+---------+-----------+----------+-------------------+ SSV      None           No       No                   Chronic - proximal                                                        SSV / partial MID   +---------+---------------+---------+-----------+----------+-------------------+   +----+---------------+---------+-----------+----------+--------------+ LEFTCompressibilityPhasicitySpontaneityPropertiesThrombus Aging +----+---------------+---------+-----------+----------+--------------+ CFV Full           Yes      Yes                                 +----+---------------+---------+-----------+----------+--------------+    Summary: RIGHT: - Findings consistent with chronic deep vein thrombosis involving the right femoral vein, right popliteal vein, right posterior tibial veins, and right gastrocnemius veins. - Findings consistent with chronic superficial vein thrombosis involving the right small saphenous vein. - No cystic structure found in the popliteal fossa. - Ultrasound characteristics of enlarged lymph nodes are noted in the groin.  LEFT: - No evidence of common femoral vein obstruction. - Ultrasound characteristics of enlarged lymph nodes noted in the groin.  *See table(s) above for measurements and observations.    Preliminary     EKG: Normal sinus rhythm  Assessment/Plan Active Problems:   * No active hospital problems. *   #1 persistent right lower extremity swelling in the setting of recent acute DVT and PE patient compliant to  anticoagulation treatments at home. Oncologist recommended antibiotics CRP ESR and blood cultures which has been done in the ED. I will place her on heparin and admit her to [facility] to be seen by vascular surgery. Please let vascular surgery know as patient gets to [facility]. Her right lower extremity is swollen and warm to touch.  I will cover her with clindamycin.  She is allergic to Rocephin.  #2 type 2 diabetes will cover her with SSI   Estimated body mass index is 39.11 kg/m as calculated from the following:   Height as of 01/30/21: 5' 5" (1.651 m).   Weight as of an earlier encounter on 02/04/21: 106.6 kg.   DVT prophylaxis: Heparin drip Code Status: Full code Family Communication none at bedside Disposition Plan: Pending clinical improvement Consults called: ED physician discussed with Dr. Carlis Abbott with vascular surgery he needs to be informed once she gets to [facility] Admission status: Observation   Georgette Shell MD [facility]  02/04/2021, 6:10 PM

## 2021-02-05 ENCOUNTER — Observation Stay (HOSPITAL_COMMUNITY): Payer: BC Managed Care – PPO

## 2021-02-05 DIAGNOSIS — I82511 Chronic embolism and thrombosis of right femoral vein: Secondary | ICD-10-CM | POA: Diagnosis present

## 2021-02-05 DIAGNOSIS — I82409 Acute embolism and thrombosis of unspecified deep veins of unspecified lower extremity: Secondary | ICD-10-CM

## 2021-02-05 DIAGNOSIS — Z79899 Other long term (current) drug therapy: Secondary | ICD-10-CM | POA: Diagnosis not present

## 2021-02-05 DIAGNOSIS — E669 Obesity, unspecified: Secondary | ICD-10-CM | POA: Diagnosis present

## 2021-02-05 DIAGNOSIS — I82591 Chronic embolism and thrombosis of other specified deep vein of right lower extremity: Secondary | ICD-10-CM | POA: Diagnosis not present

## 2021-02-05 DIAGNOSIS — L03115 Cellulitis of right lower limb: Secondary | ICD-10-CM | POA: Diagnosis present

## 2021-02-05 DIAGNOSIS — I82491 Acute embolism and thrombosis of other specified deep vein of right lower extremity: Secondary | ICD-10-CM

## 2021-02-05 DIAGNOSIS — Z20822 Contact with and (suspected) exposure to covid-19: Secondary | ICD-10-CM | POA: Diagnosis present

## 2021-02-05 DIAGNOSIS — Z794 Long term (current) use of insulin: Secondary | ICD-10-CM | POA: Diagnosis not present

## 2021-02-05 DIAGNOSIS — I82401 Acute embolism and thrombosis of unspecified deep veins of right lower extremity: Secondary | ICD-10-CM

## 2021-02-05 DIAGNOSIS — Z86711 Personal history of pulmonary embolism: Secondary | ICD-10-CM | POA: Diagnosis not present

## 2021-02-05 DIAGNOSIS — Z6839 Body mass index (BMI) 39.0-39.9, adult: Secondary | ICD-10-CM | POA: Diagnosis not present

## 2021-02-05 DIAGNOSIS — Z881 Allergy status to other antibiotic agents status: Secondary | ICD-10-CM | POA: Diagnosis not present

## 2021-02-05 DIAGNOSIS — I82441 Acute embolism and thrombosis of right tibial vein: Secondary | ICD-10-CM | POA: Diagnosis present

## 2021-02-05 DIAGNOSIS — Z888 Allergy status to other drugs, medicaments and biological substances status: Secondary | ICD-10-CM | POA: Diagnosis not present

## 2021-02-05 DIAGNOSIS — Z885 Allergy status to narcotic agent status: Secondary | ICD-10-CM | POA: Diagnosis not present

## 2021-02-05 DIAGNOSIS — E1165 Type 2 diabetes mellitus with hyperglycemia: Secondary | ICD-10-CM | POA: Diagnosis present

## 2021-02-05 DIAGNOSIS — Z7901 Long term (current) use of anticoagulants: Secondary | ICD-10-CM | POA: Diagnosis not present

## 2021-02-05 HISTORY — DX: Acute embolism and thrombosis of unspecified deep veins of unspecified lower extremity: I82.409

## 2021-02-05 LAB — COMPREHENSIVE METABOLIC PANEL
ALT: 21 U/L (ref 0–44)
AST: 23 U/L (ref 15–41)
Albumin: 2.8 g/dL — ABNORMAL LOW (ref 3.5–5.0)
Alkaline Phosphatase: 49 U/L (ref 38–126)
Anion gap: 5 (ref 5–15)
BUN: 11 mg/dL (ref 6–20)
CO2: 28 mmol/L (ref 22–32)
Calcium: 8.6 mg/dL — ABNORMAL LOW (ref 8.9–10.3)
Chloride: 105 mmol/L (ref 98–111)
Creatinine, Ser: 0.74 mg/dL (ref 0.44–1.00)
GFR, Estimated: >60 mL/min (ref >60)
Glucose, Bld: 251 mg/dL — ABNORMAL HIGH (ref 70–99)
Potassium: 4 mmol/L (ref 3.5–5.1)
Sodium: 138 mmol/L (ref 135–145)
Total Bilirubin: 0.5 mg/dL (ref 0.3–1.2)
Total Protein: 6.5 g/dL (ref 6.5–8.1)

## 2021-02-05 LAB — HEMOGLOBIN A1C
Hgb A1c MFr Bld: 11.6 % — ABNORMAL HIGH (ref 4.8–5.6)
Mean Plasma Glucose: 286.22 mg/dL

## 2021-02-05 LAB — CBC
HCT: 35.4 % — ABNORMAL LOW (ref 36.0–46.0)
Hemoglobin: 10.9 g/dL — ABNORMAL LOW (ref 12.0–15.0)
MCH: 23.8 pg — ABNORMAL LOW (ref 26.0–34.0)
MCHC: 30.8 g/dL (ref 30.0–36.0)
MCV: 77.3 fL — ABNORMAL LOW (ref 80.0–100.0)
Platelets: 354 10*3/uL (ref 150–400)
RBC: 4.58 MIL/uL (ref 3.87–5.11)
RDW: 13.9 % (ref 11.5–15.5)
WBC: 7.3 10*3/uL (ref 4.0–10.5)
nRBC: 0 % (ref 0.0–0.2)

## 2021-02-05 LAB — GLUCOSE, CAPILLARY
Glucose-Capillary: 233 mg/dL — ABNORMAL HIGH (ref 70–99)
Glucose-Capillary: 220 mg/dL — ABNORMAL HIGH (ref 70–99)
Glucose-Capillary: 276 mg/dL — ABNORMAL HIGH (ref 70–99)
Glucose-Capillary: 132 mg/dL — ABNORMAL HIGH (ref 70–99)

## 2021-02-05 LAB — APTT
aPTT: 43 seconds — ABNORMAL HIGH (ref 24–36)
aPTT: 41 seconds — ABNORMAL HIGH (ref 24–36)

## 2021-02-05 LAB — CBG MONITORING, ED: Glucose-Capillary: 221 mg/dL — ABNORMAL HIGH (ref 70–99)

## 2021-02-05 MED ORDER — INSULIN ASPART 100 UNIT/ML IJ SOLN
0.0000 [IU] | Freq: Three times a day (TID) | INTRAMUSCULAR | Status: DC
  Administered 2021-02-05: 11 [IU] via SUBCUTANEOUS
  Administered 2021-02-06: 7 [IU] via SUBCUTANEOUS
  Administered 2021-02-06: 4 [IU] via SUBCUTANEOUS

## 2021-02-05 MED ORDER — HYDROCODONE-ACETAMINOPHEN 5-325 MG PO TABS
1.0000 | ORAL_TABLET | Freq: Four times a day (QID) | ORAL | Status: DC | PRN
  Administered 2021-02-05 – 2021-02-06 (×2): 1 via ORAL
  Filled 2021-02-05 (×2): qty 1

## 2021-02-05 MED ORDER — TRAMADOL HCL 50 MG PO TABS
50.0000 mg | ORAL_TABLET | Freq: Four times a day (QID) | ORAL | Status: DC | PRN
  Administered 2021-02-05: 50 mg via ORAL
  Filled 2021-02-05: qty 1

## 2021-02-05 MED ORDER — INSULIN GLARGINE 100 UNIT/ML ~~LOC~~ SOLN
20.0000 [IU] | Freq: Two times a day (BID) | SUBCUTANEOUS | Status: DC
  Administered 2021-02-05 – 2021-02-06 (×2): 20 [IU] via SUBCUTANEOUS
  Filled 2021-02-05 (×4): qty 0.2

## 2021-02-05 MED ORDER — CLINDAMYCIN HCL 300 MG PO CAPS
300.0000 mg | ORAL_CAPSULE | Freq: Four times a day (QID) | ORAL | Status: DC
  Administered 2021-02-05 – 2021-02-06 (×4): 300 mg via ORAL
  Filled 2021-02-05 (×6): qty 1

## 2021-02-05 MED ORDER — FENTANYL CITRATE (PF) 100 MCG/2ML IJ SOLN
50.0000 ug | Freq: Once | INTRAMUSCULAR | Status: AC
Start: 2021-02-05 — End: 2021-02-05
  Administered 2021-02-05: 50 ug via INTRAVENOUS
  Filled 2021-02-05: qty 2

## 2021-02-05 MED ORDER — TRAMADOL HCL 50 MG PO TABS: 50.0000 mg | ORAL_TABLET | Freq: Four times a day (QID) | ORAL | Status: DC | PRN

## 2021-02-05 MED ORDER — ACETAMINOPHEN 325 MG PO TABS: 650.0000 mg | ORAL_TABLET | Freq: Four times a day (QID) | ORAL | Status: DC | PRN

## 2021-02-05 MED ORDER — INSULIN ASPART 100 UNIT/ML IJ SOLN: 0.0000 [IU] | Freq: Every day | INTRAMUSCULAR | Status: DC

## 2021-02-05 MED ORDER — KETOROLAC TROMETHAMINE 30 MG/ML IJ SOLN
30.0000 mg | Freq: Once | INTRAMUSCULAR | Status: AC
  Administered 2021-02-05: 30 mg via INTRAVENOUS
  Filled 2021-02-05: qty 1

## 2021-02-05 NOTE — Progress Notes (Signed)
ANTICOAGULATION CONSULT NOTE   Pharmacy Consult for Heparin Indication: pulmonary embolus and DVT on Xarelto PTA  Allergies  Allergen Reactions   Oxycodone Itching   Percocet [Oxycodone-Acetaminophen] Itching   Amoxapine And Related Other (See Comments)    Burns skin, hair falls out   Amoxicillin Other (See Comments)    Burns skin and hair falls out   Tramadol Itching   Rocephin [Ceftriaxone] Rash    swelling   Patient Measurements:   Heparin Dosing Weight: 82 kg  Vital Signs: Temp: 98.4 F (36.9 C) (07/16 1020) Temp Source: Oral (07/16 1020) BP: 116/80 (07/16 1020) Pulse Rate: 84 (07/16 1020)  Labs: Recent Labs    02/04/21 1421 02/04/21 1950 02/05/21 0440  HGB 12.2  --  10.9*  HCT 39.1  --  35.4*  PLT 375  --  354  APTT  --  26 43*  HEPARINUNFRC  --  >1.10*  --   CREATININE 0.73  --  0.74     Estimated Creatinine Clearance: 113.3 mL/min (by C-G formula based on SCr of 0.74 mg/dL).   Medical History: Past Medical History:  Diagnosis Date   Asthma    Diabetes mellitus without complication (HCC)    PE (pulmonary thromboembolism) (HCC) 2016    Medications:  Scheduled:   insulin aspart  0-20 Units Subcutaneous TID WC   insulin aspart  0-5 Units Subcutaneous QHS   insulin aspart  4 Units Subcutaneous TID WC   insulin glargine  20 Units Subcutaneous BID   Infusions:   heparin 1,600 Units/hr (02/05/21 1446)    Assessment: Last Xarelto 15mg  dose was 7/15 at 0930.   6/28 CT chest with bilateral pulmonary embolism and acute right lower extremity DVT.  There was no provoking factor.  At that time vascular surgery was consulted - did not recommend thrombolysis patient was discharged on Lovenox as she could not afford to pay for Xarelto or Eliquis. 7/3: Anti-coagulation changed to Xarelto, starter pack 15mg  bid x 3 weeks, then 20mg  qd 7/10: to ED with worsening RLE swelling, rash. Treated topically for bacterial infection 7/15: to ED from [facility] for  worsening RLE edema. ED physician discussed with vascular surgery Dr. today who does not think patient is a surgical candidate Plan admit to [facility] so vascular surgery can see her in consultation.    PTT came at 56 per lab. They will release the value. We will adjust the rate and recheck PTT  Goal of Therapy:  Heparin level 0.3-0.7 units/ml aPTT 66-102 seconds Monitor platelets by anticoagulation protocol: Yes    Plan:  Increase Heparin infusion to 1800 units/hr Check aPTT at 6 hr  Daily CBC  [facility], PharmD, BCIDP, AAHIVP, CPP Infectious Disease Pharmacist 02/05/2021 3:05 PM

## 2021-02-05 NOTE — ED Notes (Signed)
Pt unable to use purewick. Purewick removed and pt placed on bedpan.

## 2021-02-05 NOTE — Progress Notes (Signed)
ANTICOAGULATION CONSULT NOTE   Pharmacy Consult for Heparin Indication: pulmonary embolus and DVT on Xarelto PTA  Allergies  Allergen Reactions   Oxycodone Itching   Percocet [Oxycodone-Acetaminophen] Itching   Amoxapine And Related Other (See Comments)    Burns skin, hair falls out   Amoxicillin Other (See Comments)    Burns skin and hair falls out   Tramadol Itching   Rocephin [Ceftriaxone] Rash    swelling   Patient Measurements:   Heparin Dosing Weight: 82 kg  Vital Signs: BP: 118/75 (07/16 0500) Pulse Rate: 74 (07/16 0500)  Labs: Recent Labs    02/04/21 1421 02/04/21 1950 02/05/21 0440  HGB 12.2  --  10.9*  HCT 39.1  --  35.4*  PLT 375  --  354  APTT  --  26 43*  HEPARINUNFRC  --  >1.10*  --   CREATININE 0.73  --  0.74     Estimated Creatinine Clearance: 113.3 mL/min (by C-G formula based on SCr of 0.74 mg/dL).   Medical History: Past Medical History:  Diagnosis Date   Asthma    Diabetes mellitus without complication (HCC)    PE (pulmonary thromboembolism) (HCC) 2016    Medications:  Scheduled:   insulin aspart  0-15 Units Subcutaneous TID WC   insulin aspart  4 Units Subcutaneous TID WC   rivaroxaban   Does not apply Once   Infusions:   clindamycin (CLEOCIN) IV Stopped (02/05/21 0421)   heparin 1,300 Units/hr (02/04/21 2211)    Assessment: Last Xarelto 15mg  dose was 7/15 at 0930.   6/28 CT chest with bilateral pulmonary embolism and acute right lower extremity DVT.  There was no provoking factor.  At that time vascular surgery was consulted - did not recommend thrombolysis patient was discharged on Lovenox as she could not afford to pay for Xarelto or Eliquis. 7/3: Anti-coagulation changed to Xarelto, starter pack 15mg  bid x 3 weeks, then 20mg  qd 7/10: to ED with worsening RLE swelling, rash. Treated topically for bacterial infection 7/15: to ED from [facility] for worsening RLE edema. ED physician discussed with vascular surgery Dr.  today who does not think patient is a surgical candidate Plan admit to [facility] so vascular surgery can see her in consultation.    02/05/2021 aPTT 43 subtherapeutic on 1300 units/hr Hgb down to 10.9, Plts WNL No bleeding or interruptions per RN  Goal of Therapy:  Heparin level 0.3-0.7 units/ml aPTT 66-102 seconds Monitor platelets by anticoagulation protocol: Yes    Plan:  Increase Heparin infusion to 1600 units/hr Check aPTT at 6 hr  Daily CBC  Chestine Spore RPh 02/05/2021, 5:42 AM

## 2021-02-05 NOTE — Consult Note (Signed)
Hospital Consult    Reason for Consult: Worsening right leg swelling in setting of recent right leg DVT Referring Physician: ED MRN #:  [id number]  History of Present Illness: This is a 40 y.o. female with history of diabetes and previous PE in 2016 that vascular surgery has been consulted for worsening right leg swelling after recent diagnosis of right leg DVT.  She was seen by my partner Dr. Darrick Penna 01/21/21 with right leg DVT and PE with right popliteal and superficial femoral vein.  Patient was in her hematologist office yesterday and ultimately was sent to the ED and was evaluated at [facility].  She feels that her leg was initially doing better after discharge and then got worse over the last 3 or 4 days.  She was supposed to be discharged on Lovenox but states her insurance did not approve so she has been taking Xarelto.  She has not missed any doses.  She had a right lower extremity venous duplex study that showed findings of thrombus in the superficial right femoral vein, popliteal vein and tibial veins yesterday.  The right common femoral vein was open as noted on previous duplex.  Sounds like she has been working at a desk and has not been elevating her leg throughout the day.  Past Medical History:  Diagnosis Date   Asthma    Diabetes mellitus without complication (HCC)    PE (pulmonary thromboembolism) (HCC) 2016    History reviewed. No pertinent surgical history.  Allergies  Allergen Reactions   Oxycodone Itching   Percocet [Oxycodone-Acetaminophen] Itching   Amoxapine And Related Other (See Comments)    Burns skin, hair falls out   Amoxicillin Other (See Comments)    Burns skin and hair falls out   Tramadol Itching   Rocephin [Ceftriaxone] Rash    swelling    Prior to Admission medications   Medication Sig Start Date End Date Taking? Authorizing Provider  insulin aspart (NOVOLOG) 100 UNIT/ML FlexPen Check your blood sugar prior to your meal, three times daily. Then  depending on your blood sugar, inject insulin as follows: Blood Glucose 121 - 150: 1 unit. BG 151 - 200: 2 units. BG 201 - 250: 3 units. BG 251 - 300: 5 units. BG 301 - 350: 7 units. BG 351 - 400: 9 units Patient taking differently: Inject 1-9 Units into the skin 2 (two) times daily. Check your blood sugar prior to your meal, three times daily. Then depending on your blood sugar, inject insulin as follows: Blood Glucose 121 - 150: 1 unit. BG 151 - 200: 2 units. BG 201 - 250: 3 units. BG 251 - 300: 5 units. BG 301 - 350: 7 units. BG 351 - 400: 9 units 01/23/21  Yes Noralee Stain, DO  RIVAROXABAN Carlena Hurl) VTE STARTER PACK (15 & 20 MG) Follow package directions: Take one 15mg  tablet by mouth twice a day. On day 22, switch to one 20mg  tablet once a day. Take with food. Patient taking differently: Take 15 mg by mouth 2 (two) times daily. Follow package directions: Take one 15mg  tablet by mouth twice a day. On day 22, switch to one 20mg  tablet once a day. Take with food. 01/23/21  Yes Hong, , MD  TOUJEO SOLOSTAR 300 UNIT/ML Solostar Pen Inject 30 Units into the skin in the morning and at bedtime. 01/22/21 04/22/21 Yes 03/26/21, DO  diphenhydrAMINE (BENADRYL) 25 mg capsule Take 1 capsule (25 mg total) by mouth every 6 (six) hours as needed  for itching. Patient not taking: No sig reported 01/22/21   Noralee Stain, DO  enoxaparin (LOVENOX) 120 MG/0.8ML injection Inject 0.74 mLs (110 mg total) into the skin 2 (two) times daily. Patient not taking: No sig reported 01/22/21 04/22/21  Noralee Stain, DO  Insulin Pen Needle 31G X 5 MM MISC Three times daily 01/23/21   Noralee Stain, DO  mupirocin cream (BACTROBAN) 2 % Apply 1 application topically 2 (two) times daily. Apply to rash on legs 01/30/21   Mancel Bale, MD  gabapentin (NEURONTIN) 300 MG capsule Take 1 capsule (300 mg total) by mouth 3 (three) times daily. Patient not taking: No sig reported 10/31/19 01/18/21  Pollyann Savoy, MD  metoCLOPramide  (REGLAN) 10 MG tablet Take 1 tablet (10 mg total) by mouth every 6 (six) hours as needed (Nausea or headache). Patient not taking: Reported on 10/12/2017 02/18/13 10/31/19  Dione Booze, MD  promethazine (PHENERGAN) 25 MG tablet Take 1 tablet (25 mg total) by mouth every 6 (six) hours as needed for nausea or vomiting (headache). Patient not taking: No sig reported 06/04/20 01/18/21  Charlynne Pander, MD    Social History   Socioeconomic History   Marital status: Married    Spouse name: Not on file   Number of children: Not on file   Years of education: Not on file   Highest education level: Not on file  Occupational History   Not on file  Tobacco Use   Smoking status: Never   Smokeless tobacco: Never  Vaping Use   Vaping Use: Former   Substances: CBD  Substance and Sexual Activity   Alcohol use: Yes   Drug use: Never   Sexual activity: Not on file  Other Topics Concern   Not on file  Social History Narrative   Not on file   Social Determinants of Health   Financial Resource Strain: Not on file  Food Insecurity: Not on file  Transportation Needs: Not on file  Physical Activity: Not on file  Stress: Not on file  Social Connections: Not on file  Intimate Partner Violence: Not on file     Family History  Problem Relation Age of Onset   Deep vein thrombosis Father     ROS: [x]  Positive   [ ]  Negative   [ ]  All sytems reviewed and are negative  Cardiovascular: []  chest pain/pressure []  palpitations []  SOB lying flat []  DOE []  pain in legs while walking []  pain in legs at rest []  pain in legs at night []  non-healing ulcers []  hx of DVT [x]  swelling in legs - right  Pulmonary: []  productive cough []  asthma/wheezing []  home O2  Neurologic: []  weakness in []  arms []  legs []  numbness in []  arms []  legs []  hx of CVA []  mini stroke [] difficulty speaking or slurred speech []  temporary loss of vision in one eye []  dizziness  Hematologic: []  hx of cancer []   bleeding problems []  problems with blood clotting easily  Endocrine:   []  diabetes []  thyroid disease  GI []  vomiting blood []  blood in stool  GU: []  CKD/renal failure []  HD--[]  M/W/F or []  T/T/S []  burning with urination []  blood in urine  Psychiatric: []  anxiety []  depression  Musculoskeletal: []  arthritis []  joint pain  Integumentary: []  rashes []  ulcers  Constitutional: []  fever []  chills   Physical Examination  Vitals:   02/05/21 0900 02/05/21 1020  BP: (!) 130/99 116/80  Pulse: 85 84  Resp: 16 18  Temp:  98.4 F (36.9 C)  SpO2: 100% 100%   There is no height or weight on file to calculate BMI.  General:  NAD Gait: Not observed HENT: WNL, normocephalic Pulmonary: normal non-labored breathing, without Rales, rhonchi,  wheezing Cardiac: regular, without  Murmurs, rubs or gallops Abdomen:  soft, NT/ND Vascular Exam/Pulses: Right leg very swollen below the knee with palpable dorsalis pedis pulse in the right foot. No pain with passive flexion of the right great toe, calf is tender from swelling but no frank compartment syndrome No frank discoloration of the right leg Extremities: without ischemic changes, Musculoskeletal: no muscle wasting or atrophy  Neurologic: A&O X 3; Appropriate Affect ; SENSATION: normal; MOTOR FUNCTION:  moving all extremities equally. Speech is fluent/normal   CBC    Component Value Date/Time   WBC 7.3 02/05/2021 0440   RBC 4.58 02/05/2021 0440   HGB 10.9 (L) 02/05/2021 0440   HCT 35.4 (L) 02/05/2021 0440   PLT 354 02/05/2021 0440   MCV 77.3 (L) 02/05/2021 0440   MCH 23.8 (L) 02/05/2021 0440   MCHC 30.8 02/05/2021 0440   RDW 13.9 02/05/2021 0440   LYMPHSABS 3.6 02/04/2021 1421   MONOABS 0.5 02/04/2021 1421   EOSABS 0.2 02/04/2021 1421   BASOSABS 0.0 02/04/2021 1421    BMET    Component Value Date/Time   NA 138 02/05/2021 0440   K 4.0 02/05/2021 0440   CL 105 02/05/2021 0440   CO2 28 02/05/2021 0440   GLUCOSE  251 (H) 02/05/2021 0440   BUN 11 02/05/2021 0440   CREATININE 0.74 02/05/2021 0440   CALCIUM 8.6 (L) 02/05/2021 0440   GFRNONAA >60 02/05/2021 0440   GFRAA >60 10/31/2019 1713    COAGS: Lab Results  Component Value Date   INR 1.1 01/18/2021     Non-Invasive Vascular Imaging:    I reviewed her right lower extremity venous duplex study yesterday and this looks similar to her duplex from several weeks ago on 12/29/2020 common femoral vein is patent   ASSESSMENT/PLAN: This is a 40 y.o. female that vascular surgery has been asked to reevaluate her right leg for worsening swelling in the setting of recent right leg DVT diagnosis 01/18/2021.  Her leg is fairly swollen below the knee and she does have a palpable dorsalis pedis pulse and I do not think she has any evidence of phlegmasia.  No evidence of treatment failure given her duplex yesterday looks the same as it did several weeks ago with DVT in  right superficial femoral vein, popliteal vein, and tibial veins and the common femoral vein remains patent.  I will order an iliac vein duplex just to ensure she does not have any more proximal involvement.  I discussed that in the setting of involvement of the superficial femoral-popliteal and tibial veins without more proximal extension this is typically managed with anticoagulation compression and elevation.  The goal of intervention is to prevent post thrombotic syndrome but again this has been reserved for more proximal DVTs specifically with iliac vein involvement and common femoral vein involvement.  I wrapped her leg very aggressively with Ace wraps from the foot all the way up to the proximal thigh and I got several pillows in her room and discussed she needs to make an effort to keep her leg as elevated as possible.  I am not clear on her anticoagulation plan.  She was initially discharged with Lovenox but states insurance would not approve that so then she has been on Xarelto but  today she is on  heparin.  Would defer to heme-onc.  Cephus Shellinghristopher J. Johntavius Shepard, MD [facility] [place] Office: [phone]  Cephus Shellinghristopher J Eryn Krejci

## 2021-02-05 NOTE — Plan of Care (Signed)
  Problem: Education: Goal: Knowledge of General Education information will improve Description: Including pain rating scale, medication(s)/side effects and non-pharmacologic comfort measures Outcome: Progressing   Problem: Clinical Measurements: Goal: Diagnostic test results will improve Outcome: Progressing   Problem: Nutrition: Goal: Adequate nutrition will be maintained Outcome: Progressing   Problem: Coping: Goal: Level of anxiety will decrease Outcome: Progressing   Problem: Pain Managment: Goal: General experience of comfort will improve Outcome: Progressing   

## 2021-02-05 NOTE — Progress Notes (Signed)
New Admission Note:   Arrival Method:  via Stretcher Mental Orientation: alert and oriented x 4  Telemetry:  on sinus rhythm  Assessment: Completed Skin: skin intact with some blisters noted on right LE shin, and on the left thigh lateral side IV: on heparin drip infusing @ 16 u/hr  Pain: tolerable pain right  Tubes: Safety Measures: Safety Fall Prevention Plan has been given, discussed and signed Admission: Completed [facility] Orientation: Patient has been orientated to the room, unit and staff.  Family: unaccompanied  Orders have been reviewed and implemented. Will continue to monitor the patient. Call light has been placed within reach and bed alarm has been activated.   Katrina Stack, RN

## 2021-02-05 NOTE — Progress Notes (Addendum)
Shannon Oneal  OZY:[id number] DOB: 1981/06/25 DOA: 02/04/2021 PCP: Pcp, No    Brief Narrative:  40 year old with a history of DM2, PE 2016 (tx w/ Eliquis > Pradaxa),  and acute R LE DVT w/ B PE diagnosed January 18, 2021 tx w/ Xarelto who was sent to [facility], ER from her oncologist office due to persistent swelling of the right leg.  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.  She states she feels "about the same with ongoing pain in the right lower extremity.  She denies chest pain shortness of breath fevers or chills.  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 Continue IV heparin - transfer to [facility] for Vascular Surgery evaluation  Possible overlying R LE cellulitis  At time of admission there was concern for possible R LE cellutlitis as a potential etiology of her increased swelling and pain - I do not find overly convincing evidence of this on exam, though she does have a small skin wound on the anterior tibial area - will transition to oral abx to complete a short 3 day course and follow clinically   Borderline +B2GP IgA antibody Hematology recommends lovenox for outpt anticoag instead of DOAC once able to transition off insulin gtt - will ask TOC to investigate cost and assistance programs   Uncontrolled DM2 w/ hyperglycemia  A1c 11.6 -CBG presently above goal -adjust treatment and follow trend  Obesity    Family Communication:  Status is: Observation  The patient will require care spanning > 2 midnights and should be moved to inpatient because: Inpatient level of care appropriate due to severity of illness  Dispo: The patient is from: Home               Anticipated d/c is to: Home              Patient currently is not medically stable to d/c.   Difficult to place patient No   Objective: Blood pressure 116/81, pulse 85, temperature 98 F (36.7 C), temperature source Oral, resp. rate 16, SpO2 100 %. No intake or output data in the 24 hours ending 02/05/21 0853 There were no vitals filed for this visit.  Examination: General: No acute respiratory distress Lungs: Clear to auscultation bilaterally without wheezes or crackles Cardiovascular: Regular rate and rhythm without murmur gallop or rub normal S1 and S2 Abdomen: Nontender, nondistended, soft, bowel sounds positive, no rebound, no ascites, no appreciable mass Extremities: R LE w/ 3+ edema to knee - minor cutatneous wound anterior tibial area w/o purulent d/c or fluctuance - 1+ edema L LE   CBC: Recent Labs  Lab 02/04/21 1421 02/05/21 0440  WBC 8.1 7.3  NEUTROABS 3.7  --   HGB 12.2 10.9*  HCT 39.1 35.4*  MCV 77.7* 77.3*  PLT 375 354   Basic Metabolic Panel: Recent Labs  Lab 02/04/21 1421 02/05/21 0440  NA 135 138  K 4.2 4.0  CL 104 105  CO2 26 28  GLUCOSE 282* 251*  BUN 11 11  CREATININE 0.73 0.74  CALCIUM 8.7* 8.6*   GFR: Estimated Creatinine Clearance: 113.3 mL/min (by C-G formula based on  SCr of 0.74 mg/dL).  Liver Function Tests: Recent Labs  Lab 02/05/21 0440  AST 23  ALT 21  ALKPHOS 49  BILITOT 0.5  PROT 6.5  ALBUMIN 2.8*     HbA1C: Hgb A1c MFr Bld  Date/Time Value Ref Range Status  02/04/2021 06:31 PM 11.6 (H) 4.8 - 5.6 % Final    Comment:    (NOTE) Pre diabetes:          5.7%-6.4%  Diabetes:              >6.4%  Glycemic control for   <7.0% adults with diabetes   01/19/2021 04:06 AM 12.9 (H) 4.8 - 5.6 % Final    Comment:    (NOTE)         Prediabetes: 5.7 - 6.4         Diabetes: >6.4         Glycemic control for adults with diabetes: <7.0     CBG: Recent Labs  Lab 02/04/21 2212  GLUCAP 165*    Recent Results (from the  past 240 hour(s))  Resp Panel by RT-PCR (Flu A&B, Covid) Nasopharyngeal Swab     Status: None   Collection Time: 02/04/21  6:58 PM   Specimen: Nasopharyngeal Swab; Nasopharyngeal(NP) swabs in vial transport medium  Result Value Ref Range Status   SARS Coronavirus 2 by RT PCR NEGATIVE NEGATIVE Final    Comment: (NOTE) SARS-CoV-2 target nucleic acids are NOT DETECTED.  The SARS-CoV-2 RNA is generally detectable in upper respiratory specimens during the acute phase of infection. The lowest concentration of SARS-CoV-2 viral copies this assay can detect is 138 copies/mL. A negative result does not preclude SARS-Cov-2 infection and should not be used as the sole basis for treatment or other patient management decisions. A negative result may occur with  improper specimen collection/handling, submission of specimen other than nasopharyngeal swab, presence of viral mutation(s) within the areas targeted by this assay, and inadequate number of viral copies(<138 copies/mL). A negative result must be combined with clinical observations, patient history, and epidemiological information. The expected result is Negative.  Fact Sheet for Patients:  BloggerCourse.com  Fact Sheet for Healthcare Providers:  SeriousBroker.it  This test is no t yet approved or cleared by the [place] FDA and  has been authorized for detection and/or diagnosis of SARS-CoV-2 by FDA under an Emergency Use Authorization (EUA). This EUA will remain  in effect (meaning this test can be used) for the duration of the COVID-19 declaration under Section 564(b)(1) of the Act, 21 U.S.C.section 360bbb-3(b)(1), unless the authorization is terminated  or revoked sooner.       Influenza A by PCR NEGATIVE NEGATIVE Final   Influenza B by PCR NEGATIVE NEGATIVE Final    Comment: (NOTE) The Xpert Xpress SARS-CoV-2/FLU/RSV plus assay is intended as an aid in the diagnosis of  influenza from Nasopharyngeal swab specimens and should not be used as a sole basis for treatment. Nasal washings and aspirates are unacceptable for Xpert Xpress SARS-CoV-2/FLU/RSV testing.  Fact Sheet for Patients: BloggerCourse.com  Fact Sheet for Healthcare Providers: SeriousBroker.it  This test is not yet approved or cleared by the [place] FDA and has been authorized for detection and/or diagnosis of SARS-CoV-2 by FDA under an Emergency Use Authorization (EUA). This EUA will remain in effect (meaning this test can be used) for the duration of the COVID-19 declaration under Section 564(b)(1) of the Act, 21 U.S.C. section 360bbb-3(b)(1), unless the authorization is terminated or revoked.  Performed at  [facility], [address] [place], [place] [address]      Scheduled Meds:  insulin aspart  0-15 Units Subcutaneous TID WC   insulin aspart  4 Units Subcutaneous TID WC   rivaroxaban   Does not apply Once   Continuous Infusions:  clindamycin (CLEOCIN) IV Stopped (02/05/21 0421)   heparin 1,600 Units/hr (02/05/21 0545)     LOS: 0 days   Lonia Blood, MD [facility] Office  [phone] Pager - Text Page per Loretha Stapler  If 7PM-7AM, please contact night-coverage per Amion 02/05/2021, 8:53 AM

## 2021-02-05 NOTE — Progress Notes (Signed)
VASCULAR LAB    Right duplex of the common femoral and external iliac veins has been performed.  See CV proc for preliminary results.   Jayanth Szczesniak, RVT 02/05/2021, 5:25 PM

## 2021-02-06 DIAGNOSIS — I82591 Chronic embolism and thrombosis of other specified deep vein of right lower extremity: Secondary | ICD-10-CM

## 2021-02-06 LAB — BLOOD CULTURE ID PANEL (REFLEXED) - BCID2

## 2021-02-06 LAB — COMPREHENSIVE METABOLIC PANEL
ALT: 23 U/L (ref 0–44)
AST: 28 U/L (ref 15–41)
Albumin: 2.5 g/dL — ABNORMAL LOW (ref 3.5–5.0)
Alkaline Phosphatase: 45 U/L (ref 38–126)
Anion gap: 4 — ABNORMAL LOW (ref 5–15)
BUN: 7 mg/dL (ref 6–20)
CO2: 28 mmol/L (ref 22–32)
Calcium: 8.4 mg/dL — ABNORMAL LOW (ref 8.9–10.3)
Chloride: 101 mmol/L (ref 98–111)
Creatinine, Ser: 0.84 mg/dL (ref 0.44–1.00)
GFR, Estimated: >60 mL/min (ref >60)
Glucose, Bld: 203 mg/dL — ABNORMAL HIGH (ref 70–99)
Potassium: 3.6 mmol/L (ref 3.5–5.1)
Sodium: 133 mmol/L — ABNORMAL LOW (ref 135–145)
Total Bilirubin: 0.3 mg/dL (ref 0.3–1.2)
Total Protein: 6.1 g/dL — ABNORMAL LOW (ref 6.5–8.1)

## 2021-02-06 LAB — CBC
HCT: 35.1 % — ABNORMAL LOW (ref 36.0–46.0)
Hemoglobin: 10.8 g/dL — ABNORMAL LOW (ref 12.0–15.0)
MCH: 23.6 pg — ABNORMAL LOW (ref 26.0–34.0)
MCHC: 30.8 g/dL (ref 30.0–36.0)
MCV: 76.8 fL — ABNORMAL LOW (ref 80.0–100.0)
Platelets: 347 10*3/uL (ref 150–400)
RBC: 4.57 MIL/uL (ref 3.87–5.11)
RDW: 13.8 % (ref 11.5–15.5)
WBC: 8 10*3/uL (ref 4.0–10.5)
nRBC: 0 % (ref 0.0–0.2)

## 2021-02-06 LAB — APTT
aPTT: 95 seconds — ABNORMAL HIGH (ref 24–36)
aPTT: 44 seconds — ABNORMAL HIGH (ref 24–36)

## 2021-02-06 LAB — HEPARIN LEVEL (UNFRACTIONATED)
Heparin Unfractionated: 1.01 IU/mL — ABNORMAL HIGH (ref 0.30–0.70)
Heparin Unfractionated: 0.51 IU/mL (ref 0.30–0.70)

## 2021-02-06 LAB — GLUCOSE, CAPILLARY
Glucose-Capillary: 206 mg/dL — ABNORMAL HIGH (ref 70–99)
Glucose-Capillary: 181 mg/dL — ABNORMAL HIGH (ref 70–99)

## 2021-02-06 MED ORDER — ACETAMINOPHEN 325 MG PO TABS: 650.0000 mg | ORAL_TABLET | Freq: Four times a day (QID) | ORAL | 0 refills | Status: DC | PRN

## 2021-02-06 MED ORDER — RIVAROXABAN 15 MG PO TABS
15.0000 mg | ORAL_TABLET | Freq: Two times a day (BID) | ORAL | Status: DC
  Administered 2021-02-06: 15 mg via ORAL
  Filled 2021-02-06: qty 1

## 2021-02-06 MED ORDER — RIVAROXABAN (XARELTO) VTE STARTER PACK (15 & 20 MG): 15.0000 mg | ORAL_TABLET | Freq: Two times a day (BID) | ORAL | Status: DC

## 2021-02-06 MED ORDER — HYDROCODONE-ACETAMINOPHEN 5-325 MG PO TABS: 1.0000 | ORAL_TABLET | Freq: Four times a day (QID) | ORAL | 0 refills | Status: DC | PRN

## 2021-02-06 NOTE — Plan of Care (Signed)
  Problem: Education: Goal: Knowledge of General Education information will improve Description: Including pain rating scale, medication(s)/side effects and non-pharmacologic comfort measures Outcome: Progressing   Problem: Clinical Measurements: Goal: Ability to maintain clinical measurements within normal limits will improve Outcome: Progressing   Problem: Coping: Goal: Level of anxiety will decrease Outcome: Progressing   

## 2021-02-06 NOTE — Progress Notes (Signed)
[facility] [place]  Subjective  -some improvement in her right leg swelling last night with Ace wraps and elevation.   Objective 111/74 93 97.9 F (36.6 C) (Oral) 18 99%  Intake/Output Summary (Last 24 hours) at 02/06/2021 1129 Last data filed at 02/06/2021 0826 Gross per 24 hour  Intake 961.93 ml  Output 0 ml  Net 961.93 ml    Right DP PT brisk by Doppler and hard appreciate a pulse given the swelling Right calf is softer  Laboratory Lab Results: Recent Labs    02/05/21 0440 02/06/21 0021  WBC 7.3 8.0  HGB 10.9* 10.8*  HCT 35.4* 35.1*  PLT 354 347   BMET Recent Labs    02/05/21 0440 02/06/21 0021  NA 138 133*  K 4.0 3.6  CL 105 101  CO2 28 28  GLUCOSE 251* 203*  BUN 11 7  CREATININE 0.74 0.84  CALCIUM 8.6* 8.4*    COAG Lab Results  Component Value Date   INR 1.1 01/18/2021   No results found for: PTT  Assessment/Planning:  Discussed with her that right iliac venous duplex shows that her iliac vein is patent as well as her common femoral vein consistent with her duplex 2 weeks ago.  I do not think she needs surgical intervention as I previously discussed given she has no significant proximal extension.  Her DVT appears to be the same in the superficial femoral vein, popliteal and tibial veins.  I do not think she has phlegmasia.  Continue Ace wraps and leg elevation.  She needs a compression stocking and I think the nurses can reach out to [address]. to get her sized for thigh-high stocking.  I will let Dr. Darrick Penna know she is here in the hospital.  Back on Xarelto.  Cephus Shelling 02/06/2021 11:29 AM --

## 2021-02-06 NOTE — Progress Notes (Signed)
PHARMACY - PHYSICIAN COMMUNICATION CRITICAL VALUE ALERT - BLOOD CULTURE IDENTIFICATION (BCID)  Agata Lucente is an 40 y.o. female who presented to [facility] on 02/04/2021 with a chief complaint of chronic DVT  Assessment:  Pt with staph epi in 1/4 blood culture bottles. Possible contaminant. Noted pt on po clindamycin for possible cellulitis.   Name of physician (or Provider) Contacted: Dr. Natale Milch  Current antibiotics: Clindamycin  Changes to prescribed antibiotics recommended:  No additional antibiotics needed  Results for orders placed or performed during the hospital encounter of 02/04/21  Blood Culture ID Panel (Reflexed) (Collected: 02/04/2021  5:48 PM)  Result Value Ref Range   Enterococcus faecalis NOT DETECTED NOT DETECTED   Enterococcus Faecium NOT DETECTED NOT DETECTED   Listeria monocytogenes NOT DETECTED NOT DETECTED   Staphylococcus species DETECTED (A) NOT DETECTED   Staphylococcus aureus (BCID) NOT DETECTED NOT DETECTED   Staphylococcus epidermidis DETECTED (A) NOT DETECTED   Staphylococcus lugdunensis NOT DETECTED NOT DETECTED   Streptococcus species NOT DETECTED NOT DETECTED   Streptococcus agalactiae NOT DETECTED NOT DETECTED   Streptococcus pneumoniae NOT DETECTED NOT DETECTED   Streptococcus pyogenes NOT DETECTED NOT DETECTED   A.calcoaceticus-baumannii NOT DETECTED NOT DETECTED   Bacteroides fragilis NOT DETECTED NOT DETECTED   Enterobacterales NOT DETECTED NOT DETECTED   Enterobacter cloacae complex NOT DETECTED NOT DETECTED   Escherichia coli NOT DETECTED NOT DETECTED   Klebsiella aerogenes NOT DETECTED NOT DETECTED   Klebsiella oxytoca NOT DETECTED NOT DETECTED   Klebsiella pneumoniae NOT DETECTED NOT DETECTED   Proteus species NOT DETECTED NOT DETECTED   Salmonella species NOT DETECTED NOT DETECTED   Serratia marcescens NOT DETECTED NOT DETECTED   Haemophilus influenzae NOT DETECTED NOT DETECTED   Neisseria meningitidis NOT DETECTED NOT DETECTED    Pseudomonas aeruginosa NOT DETECTED NOT DETECTED   Stenotrophomonas maltophilia NOT DETECTED NOT DETECTED   Candida albicans NOT DETECTED NOT DETECTED   Candida auris NOT DETECTED NOT DETECTED   Candida glabrata NOT DETECTED NOT DETECTED   Candida krusei NOT DETECTED NOT DETECTED   Candida parapsilosis NOT DETECTED NOT DETECTED   Candida tropicalis NOT DETECTED NOT DETECTED   Cryptococcus neoformans/gattii NOT DETECTED NOT DETECTED   Methicillin resistance mecA/C DETECTED (A) NOT DETECTED    Christoper Fabian, PharmD, BCPS Please see amion for complete clinical pharmacist phone list 02/06/2021  7:11 AM

## 2021-02-06 NOTE — Discharge Summary (Signed)
Physician Discharge Summary  Shannon Oneal ZOX:[id number] DOB: 1981/07/15 DOA: 02/04/2021  PCP: Pcp, No  Admit date: 02/04/2021 Discharge date: 02/06/2021  Admitted From: Home Disposition: Home  Recommendations for Outpatient Follow-up:  Follow up with PCP in 1-2 weeks Please obtain BMP/CBC in one week Please follow up with vascular surgery as scheduled  Home Health: None Equipment/Devices: None  Discharge Condition: Stable CODE STATUS: Full Diet recommendation: Low-salt low-fat diabetic diet  Brief/Interim Summary: 40 year old with a history of DM2, PE 2016 (tx w/ Eliquis > Pradaxa),  and acute R LE DVT w/ B PE diagnosed January 18, 2021 tx w/ Xarelto who was sent to [facility], ER from her oncologist office due to persistent swelling of the right leg.  Patient admitted as above for ongoing right lower extremity edema.  Patient has known DVT of the right lower extremity diagnosed late June 2022 following with vascular surgery.  Patient was evaluated here again by vascular surgery with repeat ultrasound to ensure no progression or proximal thrombus.  Reassuringly image was negative for any proximal thrombus and per vascular surgery recommendations continue compression with Ace bandage and compression stockings as well as with elevation.  Patient will continue her Xarelto dosing at discharge and otherwise is stable and agreeable for discharge home.  She has no difficulty ambulating, we discussed that she would likely need to be recumbent whenever not ambulating with her leg elevated as much as tolerated to ensure appropriate fluid balance.  We discussed that if she should have worsening edema she could attempt to wrap her leg with an Ace bandage as vascular surgery did hear to improve vascular return.  Otherwise close follow-up with PCP, oncology and vascular surgery as indicated in the outpatient setting.  No further indication for inpatient imaging or thrombolytics or procedures at this  time.  Discharge Diagnoses:  Active Problems:   Cellulitis   DVT (deep venous thrombosis) [facility])    Discharge Instructions  Discharge Instructions     Compression stockings   Complete by: As directed    Diet - low sodium heart healthy   Complete by: As directed    Increase activity slowly   Complete by: As directed       Allergies as of 02/06/2021       Reactions   Oxycodone Itching   Percocet [oxycodone-acetaminophen] Itching   Amoxapine And Related Other (See Comments)   Burns skin, hair falls out   Amoxicillin Other (See Comments)   Burns skin and hair falls out   Tramadol Itching   Rocephin [ceftriaxone] Rash   swelling        Medication List     TAKE these medications    acetaminophen 325 MG tablet Commonly known as: TYLENOL Take 2 tablets (650 mg total) by mouth every 6 (six) hours as needed for mild pain, fever or headache.   HYDROcodone-acetaminophen 5-325 MG tablet Commonly known as: NORCO/VICODIN Take 1 tablet by mouth every 6 (six) hours as needed for moderate pain or severe pain.   insulin aspart 100 UNIT/ML FlexPen Commonly known as: NOVOLOG Check your blood sugar prior to your meal, three times daily. Then depending on your blood sugar, inject insulin as follows: Blood Glucose 121 - 150: 1 unit. BG 151 - 200: 2 units. BG 201 - 250: 3 units. BG 251 - 300: 5 units. BG 301 - 350: 7 units. BG 351 - 400: 9 units What changed:  how much to take how to take this when to take this  Insulin Pen Needle 31G X 5 MM Misc Three times daily   mupirocin cream 2 % Commonly known as: BACTROBAN Apply 1 application topically 2 (two) times daily. Apply to rash on legs   Rivaroxaban Stater Pack (15 mg and 20 mg) Commonly known as: XARELTO STARTER PACK Follow package directions: Take one 15mg  tablet by mouth twice a day. On day 22, switch to one 20mg  tablet once a day. Take with food. What changed:  how much to take how to take this when to take this    Toujeo SoloStar 300 UNIT/ML Solostar Pen Generic drug: insulin glargine (1 Unit Dial) Inject 30 Units into the skin in the morning and at bedtime.        Allergies  Allergen Reactions   Oxycodone Itching   Percocet [Oxycodone-Acetaminophen] Itching   Amoxapine And Related Other (See Comments)    Burns skin, hair falls out   Amoxicillin Other (See Comments)    Burns skin and hair falls out   Tramadol Itching   Rocephin [Ceftriaxone] Rash    swelling    Consultations: Vascular surgery  Procedures/Studies: CT Angio Chest PE W and/or Wo Contrast  Result Date: 01/18/2021 CLINICAL DATA:  PE suspected, low/intermediate prob, positive D-dimer dvt positive EXAM: CT ANGIOGRAPHY CHEST WITH CONTRAST TECHNIQUE: Multidetector CT imaging of the chest was performed using the standard protocol during bolus administration of intravenous contrast. Multiplanar CT image reconstructions and MIPs were obtained to evaluate the vascular anatomy. CONTRAST:  OMNIPAQUE IOHEXOL 350 MG/ML SOLN COMPARISON:  None. FINDINGS: Cardiovascular: There are bilateral central pulmonary emboli in the distal right and left branch pulmonary arteries. Emboli extend into into the lobar, segmental, and subsegmental branches of the lower lobes bilaterally. There is similar extension in the the upper lobes but to a lesser degree than the lower lobes. RV/LV ratio is approximately 1.02. Mediastinum/Nodes: No enlarged mediastinal, hilar, or axillary lymph nodes. Thyroid gland, trachea, and esophagus demonstrate no significant findings. Lungs/Pleura: Lungs are clear. No pleural effusion or pneumothorax. Upper Abdomen: No acute abnormality. Musculoskeletal: No chest wall abnormality. No acute or significant osseous findings. Review of the MIP images confirms the above findings. IMPRESSION: Bilateral central pulmonary emboli in the distal right and left branch pulmonary arteries, extending into the lobar, segmental, and subsegmental  branches. Mildly elevated RV/LV ratio suggesting possible right heart strain. Correlate with echocardiography. Positive for acute PE with CT evidence of right heart strain (RV/LV Ratio = 1.02) consistent with at least submassive (intermediate risk) PE. The presence of right heart strain has been associated with an increased risk of morbidity and mortality. Please refer to the "PE Focused" order set in EPIC. Critical Value/emergent results were called by telephone at the time of interpretation on 01/18/2021 at 9:38 pm to provider [facility] , who verbally acknowledged these results. Electronically Signed   By: 01/20/2021   On: 01/18/2021 21:41   VAS Caprice Renshaw IVC/ILIAC (VENOUS ONLY)  Result Date: 02/05/2021 IVC/ILIAC STUDY Patient Name:  Shannon Oneal  Date of Exam:   02/05/2021 Medical Rec #: [name]    Accession #:    02/07/2021 Date of Birth: 1980-12-11   Patient Gender: F Patient Age:   48Y Exam Location:  [facility] Procedure:      VAS 79Y IVC/ILIAC (VENOUS ONLY) Referring Phys: [facility] [place] --------------------------------------------------------------------------------  Indications: Patient positive for acute DVT in the right femoral vein,              popliteal, posterior tibial, and  gastrocnemius veins on 01/18/21 and              placed on Xarelto, swelling has increased since DVT found. 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  Comparison Study: Prior study done 02/04/21 Performing Technologist: Sherren Kerns RVS  Examination Guidelines: A complete evaluation includes B-mode imaging, spectral Doppler, color Doppler, and power Doppler as needed of all accessible portions of each vessel. Bilateral testing is considered an integral part of a complete examination. Limited examinations for reoccurring indications may be performed as noted.   +-------------------------+---------+-----------+---------+-----------+--------+            EIV           RT-PatentRT-ThrombusLT-PatentLT-ThrombusComments +-------------------------+---------+-----------+---------+-----------+--------+ External Iliac Vein Prox  patent                                          +-------------------------+---------+-----------+---------+-----------+--------+ External Iliac Vein Mid   patent                                          +-------------------------+---------+-----------+---------+-----------+--------+ External Iliac Vein       patent                                          Distal                                                                    +-------------------------+---------+-----------+---------+-----------+--------+   Summary: IVC/Iliac: There is no evidence of thrombus involving the right external iliac vein. There is no DVT noted in the right common femoral or external iliac veins.  *See table(s) above for measurements and observations.  Electronically signed by Sherald Hess MD on 02/05/2021 at 7:59:09 PM.    Final    DG Knee Complete 4 Views Left  Result Date: 01/18/2021 CLINICAL DATA:  Knee pain EXAM: LEFT KNEE - COMPLETE 4+ VIEW COMPARISON:  None. FINDINGS: No fracture or malalignment. Mild to moderate medial joint space degenerative change. No sizable knee effusion IMPRESSION: Degenerative changes.  No acute osseous abnormality Electronically Signed   By: Jasmine Pang M.D.   On: 01/18/2021 18:24   DG Knee Complete 4 Views Right  Result Date: 01/18/2021 CLINICAL DATA:  Knee pain EXAM: RIGHT KNEE - COMPLETE 4+ VIEW COMPARISON:  03/26/2016 FINDINGS: No fracture or malalignment. Mild medial and patellofemoral degenerative changes. No substantial knee effusion IMPRESSION: Mild arthritis.  No acute osseous abnormality Electronically Signed   By: Jasmine Pang M.D.   On: 01/18/2021 18:24   ECHOCARDIOGRAM  COMPLETE  Result Date: 01/19/2021    ECHOCARDIOGRAM REPORT   Patient Name:   Shannon Oneal Date of Exam: 01/19/2021 Medical Rec #:  [id number]   Height:       65.0 in Accession #:  [id number]  Weight:       239.0 lb Date of Birth:  24-May-1981  BSA:          2.134 m Patient Age:    39 years    BP:           128/76 mmHg Patient Gender: F           HR:           88 bpm. Exam Location:  Inpatient Procedure: 2D Echo, 3D Echo, Cardiac Doppler and Color Doppler Indications:    I26.02 Pulmonary embolus  History:        Patient has no prior history of Echocardiogram examinations.                 Abnormal ECG, Arrythmias:Tachycardia, Signs/Symptoms:Shortness                 of Breath and Dyspnea; Risk Factors:Diabetes.  Sonographer:    Sheralyn Boatman RDCS Referring Phys: [id number] Floreen Comber PATEL  Sonographer Comments: Image acquisition challenging due to patient body habitus. IMPRESSIONS  1. Left ventricular ejection fraction, by estimation, is 55 to 60%. Left ventricular ejection fraction by 3D volume is 58 %. The left ventricle has normal function. The left ventricle has no regional wall motion abnormalities. There is mild left ventricular hypertrophy. Left ventricular diastolic parameters were normal.  2. Right ventricular systolic function is normal. The right ventricular size is normal. Tricuspid regurgitation signal is inadequate for assessing PA pressure.  3. The mitral valve is normal in structure. Trivial mitral valve regurgitation. No evidence of mitral stenosis.  4. The aortic valve is normal in structure. Aortic valve regurgitation is not visualized. No aortic stenosis is present.  5. The inferior vena cava is normal in size with greater than 50% respiratory variability, suggesting right atrial pressure of 3 mmHg. FINDINGS  Left Ventricle: Left ventricular ejection fraction, by estimation, is 55 to 60%. Left ventricular ejection fraction by 3D volume is 58 %. The left ventricle has normal function. The left ventricle  has no regional wall motion abnormalities. The left ventricular internal cavity size was normal in size. There is mild left ventricular hypertrophy. Left ventricular diastolic parameters were normal. Right Ventricle: The right ventricular size is normal. No increase in right ventricular wall thickness. Right ventricular systolic function is normal. Tricuspid regurgitation signal is inadequate for assessing PA pressure. Left Atrium: Left atrial size was normal in size. Right Atrium: Right atrial size was normal in size. Pericardium: There is no evidence of pericardial effusion. Presence of pericardial fat pad. Mitral Valve: The mitral valve is normal in structure. Trivial mitral valve regurgitation. No evidence of mitral valve stenosis. Tricuspid Valve: The tricuspid valve is normal in structure. Tricuspid valve regurgitation is trivial. No evidence of tricuspid stenosis. Aortic Valve: The aortic valve is normal in structure. Aortic valve regurgitation is not visualized. No aortic stenosis is present. Pulmonic Valve: The pulmonic valve was normal in structure. Pulmonic valve regurgitation is not visualized. No evidence of pulmonic stenosis. Aorta: The aortic root is normal in size and structure. Venous: The inferior vena cava is normal in size with greater than 50% respiratory variability, suggesting right atrial pressure of 3 mmHg. IAS/Shunts: There is redundancy of the interatrial septum. No atrial level shunt detected by color flow Doppler.  LEFT VENTRICLE PLAX 2D LVIDd:         3.23 cm         Diastology LVIDs:  2.30 cm         LV e' medial:    7.07 cm/s LV PW:         1.20 cm         LV E/e' medial:  10.6 LV IVS:        1.23 cm         LV e' lateral:   10.00 cm/s LVOT diam:     2.00 cm         LV E/e' lateral: 7.5 LV SV:         61 LV SV Index:   28 LVOT Area:     3.14 cm        3D Volume EF                                LV 3D EF:    Left                                             ventricular LV  Volumes (MOD)                            ejection LV vol d, MOD    48.6 ml                    fraction by A2C:                                        3D volume LV vol d, MOD    61.2 ml                    is 58 %. A4C: LV vol s, MOD    18.4 ml A2C:                           3D Volume EF: LV vol s, MOD    22.8 ml       3D EF:        58 % A4C: LV SV MOD A2C:   30.2 ml LV SV MOD A4C:   61.2 ml LV SV MOD BP:    34.0 ml RIGHT VENTRICLE             IVC RV S prime:     13.60 cm/s  IVC diam: 1.30 cm TAPSE (M-mode): 2.2 cm LEFT ATRIUM             Index       RIGHT ATRIUM          Index LA diam:        3.40 cm 1.59 cm/m  RA Area:     9.30 cm LA Vol (A2C):   20.3 ml 9.51 ml/m  RA Volume:   17.20 ml 8.06 ml/m LA Vol (A4C):   24.3 ml 11.39 ml/m LA Biplane Vol: 22.7 ml 10.64 ml/m  AORTIC VALVE LVOT Vmax:   128.00 cm/s LVOT Vmean:  80.200 cm/s LVOT VTI:    0.193 m  AORTA Ao Root diam: 2.70 cm Ao Asc diam:  2.70 cm MITRAL VALVE MV Area (PHT): 3.88 cm  SHUNTS MV Decel Time: 196 msec    Systemic VTI:  0.19 m MV E velocity: 74.80 cm/s  Systemic Diam: 2.00 cm MV A velocity: 73.65 cm/s MV E/A ratio:  1.02 Weston Brass MD Electronically signed by Weston Brass MD Signature Date/Time: 01/19/2021/5:39:54 PM    Final    VAS [place] LOWER EXTREMITY VENOUS (DVT) (ONLY [facility] & [facility])  Result Date: 02/04/2021  Lower Venous DVT Study Patient Name:  Shannon Oneal  Date of Exam:   02/04/2021 Medical Rec #: [id number]    Accession #:    [id number] Date of Birth: [birth year]   Patient Gender: F Patient Age:   42Y [facility]am [facility] Procedure:      VAS [place] LOWER EXTREMITY VENOUS (DVT) Referring Phys: [id number] ELIZABETH REES --------------------------------------------------------------------------------  Indications: Increased swelling to RLE.  Risk Factors: 01/18/21 DVT 01/18/21 RLE. Comparison Study: 01/18/21 - Positive extensive DVT RLE Performing Technologist: Jody Hill RVT, RDMS  Examination Guidelines: A complete evaluation includes  B-mode imaging, spectral Doppler, color Doppler, and power Doppler as needed of all accessible portions of each vessel. Bilateral testing is considered an integral part of a complete examination. Limited examinations for reoccurring indications may be performed as noted. The reflux portion of the exam is performed with the patient in reverse Trendelenburg.  +---------+---------------+---------+-----------+----------+-------------------+ RIGHT    CompressibilityPhasicitySpontaneityPropertiesThrombus Aging      +---------+---------------+---------+-----------+----------+-------------------+ CFV      Full           Yes      Yes                                      +---------+---------------+---------+-----------+----------+-------------------+ SFJ      Full           Yes      Yes                                      +---------+---------------+---------+-----------+----------+-------------------+ FV Prox  Full           Yes      Yes                                      +---------+---------------+---------+-----------+----------+-------------------+ FV Mid   None           No       No                   Chronic             +---------+---------------+---------+-----------+----------+-------------------+ FV DistalNone           No       No                   Chronic             +---------+---------------+---------+-----------+----------+-------------------+ PFV      Full                                                             +---------+---------------+---------+-----------+----------+-------------------+ POP  None           No       No                   Chronic             +---------+---------------+---------+-----------+----------+-------------------+ PTV      Partial        No       No                   Chronic - one of                                                          paired PTVs          +---------+---------------+---------+-----------+----------+-------------------+ PERO     Full                                                             +---------+---------------+---------+-----------+----------+-------------------+ Gastroc  None           No       No                   Chronic             +---------+---------------+---------+-----------+----------+-------------------+ SSV      None           No       No                   Chronic - proximal                                                        SSV / partial MID   +---------+---------------+---------+-----------+----------+-------------------+   +----+---------------+---------+-----------+----------+--------------+ LEFTCompressibilityPhasicitySpontaneityPropertiesThrombus Aging +----+---------------+---------+-----------+----------+--------------+ CFV Full           Yes      Yes                                 +----+---------------+---------+-----------+----------+--------------+     Summary: RIGHT: - Findings consistent with chronic deep vein thrombosis involving the right femoral vein, right popliteal vein, right posterior tibial veins, and right gastrocnemius veins. - Findings consistent with chronic superficial vein thrombosis involving the right small saphenous vein. - No cystic structure found in the popliteal fossa. - Ultrasound characteristics of enlarged lymph nodes are noted in the groin.  LEFT: - No evidence of common femoral vein obstruction. - Ultrasound characteristics of enlarged lymph nodes noted in the groin.  *See table(s) above for measurements and observations. Electronically signed by Sherald Hess MD on 02/04/2021 at 6:40:08 PM.    Final    VAS [place] LOWER EXTREMITY VENOUS (DVT) (ONLY [facility] & [facility] 7a-7p)  Result Date: 01/19/2021  Lower Venous DVT Study Patient Name:  Shannon Oneal  Date of Exam:   01/18/2021 Medical Rec #: [id number]  Accession #:    [id number] Date of Birth: 02/07/1981    Patient Gender: F Patient Age:   [age]Y [facility] Procedure:      VAS US LOWER EXTREMITY VENOUS (DVT) Referring Phys: [id number] PETER C MESSICK --------------------------------------------------------------------------------  Indications: SOB, and RLE - pain and numbness.  Risk Factors: PE 2016. Comparison Study: No previous exams Performing Technologist: Jody Hill RVT, RDMS  Examination Guidelines: A complete evaluation includes B-mode imaging, spectral Doppler, color Doppler, and power Doppler as needed of all accessible portions of each vessel. Bilateral testing is considered an integral part of a complete examination. Limited examinations for reoccurring indications may be performed as noted. The reflux portion of the exam is performed with the patient in reverse Trendelenburg.  +---------+---------------+---------+-----------+----------+-------------------+ RIGHT    CompressibilityPhasicitySpontaneityPropertiesThrombus Aging      +---------+---------------+---------+-----------+----------+-------------------+ CFV      Full           Yes      Yes                                      +---------+---------------+---------+-----------+----------+-------------------+ SFJ      Full                                                             +---------+---------------+---------+-----------+----------+-------------------+ FV Prox  Full           Yes      Yes                                      +---------+---------------+---------+-----------+----------+-------------------+ FV Mid   None           No       No                   Acute               +---------+---------------+---------+-----------+----------+-------------------+ FV DistalNone           No       No                   Acute               +---------+---------------+---------+-----------+----------+-------------------+ PFV      Full                                                              +---------+---------------+---------+-----------+----------+-------------------+ POP      None           No       No                   Acute               +---------+---------------+---------+-----------+----------+-------------------+ PTV      None           No       No  Acute               +---------+---------------+---------+-----------+----------+-------------------+ PERO                                                  Not visualized on                                                         this exam           +---------+---------------+---------+-----------+----------+-------------------+ Gastroc  None           No       No                   Acute               +---------+---------------+---------+-----------+----------+-------------------+ SSV      None           No       No                   Acute               +---------+---------------+---------+-----------+----------+-------------------+   +---------+---------------+---------+-----------+----------+--------------+ LEFT     CompressibilityPhasicitySpontaneityPropertiesThrombus Aging +---------+---------------+---------+-----------+----------+--------------+ CFV      Full           Yes      Yes                                 +---------+---------------+---------+-----------+----------+--------------+ SFJ      Full                                                        +---------+---------------+---------+-----------+----------+--------------+ FV Prox  Full           Yes      Yes                                 +---------+---------------+---------+-----------+----------+--------------+ FV Mid   Full           Yes      Yes                                 +---------+---------------+---------+-----------+----------+--------------+ FV DistalFull           Yes      Yes                                  +---------+---------------+---------+-----------+----------+--------------+ PFV      Full                                                        +---------+---------------+---------+-----------+----------+--------------+  POP      Full           Yes      Yes                                 +---------+---------------+---------+-----------+----------+--------------+ PTV      Full                                                        +---------+---------------+---------+-----------+----------+--------------+ PERO     Full                                                        +---------+---------------+---------+-----------+----------+--------------+     Summary: BILATERAL: -No evidence of popliteal cyst, bilaterally. RIGHT: - Findings consistent with acute deep vein thrombosis involving the right femoral vein, right popliteal vein, right posterior tibial veins, and right gastrocnemius veins. - Findings consistent with acute superficial vein thrombosis involving the right small saphenous vein.  LEFT: - There is no evidence of deep vein thrombosis in the lower extremity. - There is no evidence of superficial venous thrombosis.  - Ultrasound characteristics of enlarged lymph nodes noted in the groin.  *See table(s) above for measurements and observations. Electronically signed by Waverly Ferrari MD on 01/19/2021 at 8:15:17 AM.    Final      Subjective: No acute issues or events overnight, patient's lower extremity appears to be improving with compression wrap, will attempt to transition to stockings today.  She otherwise denies nausea vomiting diarrhea constipation headache fevers chills chest pain shortness of breath   Discharge Exam: Vitals:   02/06/21 0511 02/06/21 1014  BP: 97/74 111/74  Pulse: 78 93  Resp: 19 18  Temp: 98.2 F (36.8 C) 97.9 F (36.6 C)  SpO2: 99% 99%   Vitals:   02/05/21 1623 02/05/21 2123 02/06/21 0511 02/06/21 1014  BP: 112/72 104/70 97/74 111/74   Pulse: 90 (!) 52 78 93  Resp: 18 20 19 18   Temp: 98.6 F (37 C) 98 F (36.7 C) 98.2 F (36.8 C) 97.9 F (36.6 C)  TempSrc: Oral Oral Oral Oral  SpO2: 100% 100% 99% 99%  Weight:   107.4 kg     General: Pt is alert, awake, not in acute distress Cardiovascular: RRR, S1/S2 +, no rubs, no gallops Respiratory: CTA bilaterally, no wheezing, no rhonchi Abdominal: Soft, NT, ND, bowel sounds + Extremities: 4+ pitting edema right lower extremity, Ace bandage tightly wrapped from her distal foot to proximal thigh, pulses intact bilaterally    The results of significant diagnostics from this hospitalization (including imaging, microbiology, ancillary and laboratory) are listed below for reference.     Microbiology: Recent Results (from the past 240 hour(s))  Culture, blood (routine x 2)     Status: None (Preliminary result)   Collection Time: 02/04/21  5:48 PM   Specimen: BLOOD  Result Value Ref Range Status   Specimen Description   Final    BLOOD RIGHT ANTECUBITAL Performed at [facility], [address] [place], [place] [address]    Special Requests   Final  BOTTLES DRAWN AEROBIC AND ANAEROBIC Blood Culture adequate volume Performed at [facility], [address], [place], [place] [address]    Culture  Setup Time   Final    GRAM POSITIVE COCCI IN CLUSTERS AEROBIC BOTTLE ONLY CRITICAL RESULT CALLED TO, READ BACK BY AND VERIFIED WITH: C,AMEND PHARMD @0652  02/06/21 EB Performed at [facility], [address], [place], [place]    Culture GRAM POSITIVE COCCI  Final   Report Status PENDING  Incomplete  Blood Culture ID Panel (Reflexed)     Status: Abnormal   Collection Time: 02/04/21  5:48 PM  Result Value Ref Range Status   Enterococcus faecalis NOT DETECTED NOT DETECTED Final   Enterococcus Faecium NOT DETECTED NOT DETECTED Final   Listeria monocytogenes NOT DETECTED NOT DETECTED Final   Staphylococcus species DETECTED  (A) NOT DETECTED Final    Comment: CRITICAL RESULT CALLED TO, READ BACK BY AND VERIFIED WITH: C,AMEND PHARMD @0652  02/06/21 EB    Staphylococcus aureus (BCID) NOT DETECTED NOT DETECTED Final   Staphylococcus epidermidis DETECTED (A) NOT DETECTED Final    Comment: Methicillin (oxacillin) resistant coagulase negative staphylococcus. Possible blood culture contaminant (unless isolated from more than one blood culture draw or clinical case suggests pathogenicity). No antibiotic treatment is indicated for blood  culture contaminants. CRITICAL RESULT CALLED TO, READ BACK BY AND VERIFIED WITH: C,AMEND PHARMD @0652  02/06/21 EB    Staphylococcus lugdunensis NOT DETECTED NOT DETECTED Final   Streptococcus species NOT DETECTED NOT DETECTED Final   Streptococcus agalactiae NOT DETECTED NOT DETECTED Final   Streptococcus pneumoniae NOT DETECTED NOT DETECTED Final   Streptococcus pyogenes NOT DETECTED NOT DETECTED Final   A.calcoaceticus-baumannii NOT DETECTED NOT DETECTED Final   Bacteroides fragilis NOT DETECTED NOT DETECTED Final   Enterobacterales NOT DETECTED NOT DETECTED Final   Enterobacter cloacae complex NOT DETECTED NOT DETECTED Final   Escherichia coli NOT DETECTED NOT DETECTED Final   Klebsiella aerogenes NOT DETECTED NOT DETECTED Final   Klebsiella oxytoca NOT DETECTED NOT DETECTED Final   Klebsiella pneumoniae NOT DETECTED NOT DETECTED Final   Proteus species NOT DETECTED NOT DETECTED Final   Salmonella species NOT DETECTED NOT DETECTED Final   Serratia marcescens NOT DETECTED NOT DETECTED Final   Haemophilus influenzae NOT DETECTED NOT DETECTED Final   Neisseria meningitidis NOT DETECTED NOT DETECTED Final   Pseudomonas aeruginosa NOT DETECTED NOT DETECTED Final   Stenotrophomonas maltophilia NOT DETECTED NOT DETECTED Final   Candida albicans NOT DETECTED NOT DETECTED Final   Candida auris NOT DETECTED NOT DETECTED Final   Candida glabrata NOT DETECTED NOT DETECTED Final   Candida  krusei NOT DETECTED NOT DETECTED Final   Candida parapsilosis NOT DETECTED NOT DETECTED Final   Candida tropicalis NOT DETECTED NOT DETECTED Final   Cryptococcus neoformans/gattii NOT DETECTED NOT DETECTED Final   Methicillin resistance mecA/C DETECTED (A) NOT DETECTED Final    Comment: CRITICAL RESULT CALLED TO, READ BACK BY AND VERIFIED WITH: C,AMEND PHARMD @0652  02/06/21 EB Performed at [facility], [address] [place], 02/08/21 [facility]   Resp Panel by RT-PCR (Flu A&B, Covid) Nasopharyngeal Swab     Status: None   Collection Time: 02/04/21  6:58 PM   Specimen: Nasopharyngeal Swab; Nasopharyngeal(NP) swabs in vial transport medium  Result Value Ref Range Status   SARS Coronavirus 2 by RT PCR NEGATIVE NEGATIVE Final    Comment: (NOTE) SARS-CoV-2 target nucleic acids are NOT DETECTED.  The SARS-CoV-2 RNA is generally detectable  in upper respiratory specimens during the acute phase of infection. The lowest concentration of SARS-CoV-2 viral copies this assay can detect is 138 copies/mL. A negative result does not preclude SARS-Cov-2 infection and should not be used as the sole basis for treatment or other patient management decisions. A negative result may occur with  improper specimen collection/handling, submission of specimen other than nasopharyngeal swab, presence of viral mutation(s) within the areas targeted by this assay, and inadequate number of viral copies(<138 copies/mL). A negative result must be combined with clinical observations, patient history, and epidemiological information. The expected result is Negative.  Fact Sheet for Patients:  BloggerCourse.com  Fact Sheet for Healthcare Providers:  SeriousBroker.it  This test is no t yet approved or cleared by the [place] FDA and  has been authorized for detection and/or diagnosis of SARS-CoV-2 by FDA under an Emergency Use Authorization (EUA). This EUA  will remain  in effect (meaning this test can be used) for the duration of the COVID-19 declaration under Section 564(b)(1) of the Act, 21 U.S.C.section 360bbb-3(b)(1), unless the authorization is terminated  or revoked sooner.       Influenza A by PCR NEGATIVE NEGATIVE Final   Influenza B by PCR NEGATIVE NEGATIVE Final    Comment: (NOTE) The Xpert Xpress SARS-CoV-2/FLU/RSV plus assay is intended as an aid in the diagnosis of influenza from Nasopharyngeal swab specimens and should not be used as a sole basis for treatment. Nasal washings and aspirates are unacceptable for Xpert Xpress SARS-CoV-2/FLU/RSV testing.  Fact Sheet for Patients: BloggerCourse.com  Fact Sheet for Healthcare Providers: SeriousBroker.it  This test is not yet approved or cleared by the [place] FDA and has been authorized for detection and/or diagnosis of SARS-CoV-2 by FDA under an Emergency Use Authorization (EUA). This EUA will remain in effect (meaning this test can be used) for the duration of the COVID-19 declaration under Section 564(b)(1) of the Act, 21 U.S.C. section 360bbb-3(b)(1), unless the authorization is terminated or revoked.  Performed at [facility], [address] [place], [place] [address]      Labs: BNP (last 3 results) No results for input(s): BNP in the last 8760 hours. Basic Metabolic Panel: Recent Labs  Lab 02/04/21 1421 02/05/21 0440 02/06/21 0021  NA 135 138 133*  K 4.2 4.0 3.6  CL 104 105 101  CO2 GLUCOSE 282* 251* 203*  BUN CREATININE 0.73 0.74 0.84  CALCIUM 8.7* 8.6* 8.4*   Liver Function Tests: Recent Labs  Lab 02/05/21 0440 02/06/21 0021  AST 23 28  ALT 21 23  ALKPHOS 49 45  BILITOT 0.5 0.3  PROT 6.5 6.1*  ALBUMIN 2.8* 2.5*   No results for input(s): LIPASE, AMYLASE in the last 168 hours. No results for input(s): AMMONIA in the last 168  hours. CBC: Recent Labs  Lab 02/04/21 1421 02/05/21 0440 02/06/21 0021  WBC 8.1 7.3 8.0  NEUTROABS 3.7  --   --   HGB 12.2 10.9* 10.8*  HCT 39.1 35.4* 35.1*  MCV 77.7* 77.3* 76.8*  PLT 375 354 347   Cardiac Enzymes: No results for input(s): CKTOTAL, CKMB, CKMBINDEX, TROPONINI in the last 168 hours. BNP: Invalid input(s): POCBNP CBG: Recent Labs  Lab 02/05/21 1154 02/05/21 1751 02/05/21 2122 02/06/21 0634 02/06/21 1125  GLUCAP 220* 276* 132* 206* 181*   D-Dimer No results for input(s): DDIMER in the last 72 hours. Hgb A1c Recent Labs    02/04/21 1831  HGBA1C  11.6*   Lipid Profile No results for input(s): CHOL, HDL, LDLCALC, TRIG, CHOLHDL, LDLDIRECT in the last 72 hours. Thyroid function studies No results for input(s): TSH, T4TOTAL, T3FREE, THYROIDAB in the last 72 hours.  Invalid input(s): FREET3 Anemia work up No results for input(s): VITAMINB12, FOLATE, FERRITIN, TIBC, IRON, RETICCTPCT in the last 72 hours. Urinalysis    Component Value Date/Time   COLORURINE YELLOW 06/02/2020 1255   APPEARANCEUR CLEAR 06/02/2020 1255   LABSPEC 1.018 06/02/2020 1255   PHURINE 5.0 06/02/2020 1255   GLUCOSEU NEGATIVE 06/02/2020 1255   HGBUR NEGATIVE 06/02/2020 1255   BILIRUBINUR NEGATIVE 06/02/2020 1255   KETONESUR 5 (A) 06/02/2020 1255   PROTEINUR NEGATIVE 06/02/2020 1255   NITRITE NEGATIVE 06/02/2020 1255   LEUKOCYTESUR NEGATIVE 06/02/2020 1255   Sepsis Labs Invalid input(s): PROCALCITONIN,  WBC,  LACTICIDVEN Microbiology Recent Results (from the past 240 hour(s))  Culture, blood (routine x 2)     Status: None (Preliminary result)   Collection Time: 02/04/21  5:48 PM   Specimen: BLOOD  Result Value Ref Range Status   Specimen Description   Final    BLOOD RIGHT ANTECUBITAL Performed at [facility], [address], [place], [place] [address]    Special Requests   Final    BOTTLES DRAWN AEROBIC AND ANAEROBIC Blood Culture adequate  volume Performed at [facility], [address], [place], [place] [address]    Culture  Setup Time   Final    GRAM POSITIVE COCCI IN CLUSTERS AEROBIC BOTTLE ONLY CRITICAL RESULT CALLED TO, READ BACK BY AND VERIFIED WITH: C,AMEND PHARMD  02/06/21 EB Performed at [facility], [address], [place], [place] [address]    Culture GRAM POSITIVE COCCI  Final   Report Status PENDING  Incomplete  Blood Culture ID Panel (Reflexed)     Status: Abnormal   Collection Time: 02/04/21  5:48 PM  Result Value Ref Range Status   Enterococcus faecalis NOT DETECTED NOT DETECTED Final   Enterococcus Faecium NOT DETECTED NOT DETECTED Final   Listeria monocytogenes NOT DETECTED NOT DETECTED Final   Staphylococcus species DETECTED (A) NOT DETECTED Final    Comment: CRITICAL RESULT CALLED TO, READ BACK BY AND VERIFIED WITH: C,AMEND PHARMD  02/06/21 EB    Staphylococcus aureus (BCID) NOT DETECTED NOT DETECTED Final   Staphylococcus epidermidis DETECTED (A) NOT DETECTED Final    Comment: Methicillin (oxacillin) resistant coagulase negative staphylococcus. Possible blood culture contaminant (unless isolated from more than one blood culture draw or clinical case suggests pathogenicity). No antibiotic treatment is indicated for blood  culture contaminants. CRITICAL RESULT CALLED TO, READ BACK BY AND VERIFIED WITH: C,AMEND PHARMD  02/06/21 EB    Staphylococcus lugdunensis NOT DETECTED NOT DETECTED Final   Streptococcus species NOT DETECTED NOT DETECTED Final   Streptococcus agalactiae NOT DETECTED NOT DETECTED Final   Streptococcus pneumoniae NOT DETECTED NOT DETECTED Final   Streptococcus pyogenes NOT DETECTED NOT DETECTED Final   A.calcoaceticus-baumannii NOT DETECTED NOT DETECTED Final   Bacteroides fragilis NOT DETECTED NOT DETECTED Final   Enterobacterales NOT DETECTED NOT DETECTED Final   Enterobacter cloacae complex NOT DETECTED NOT DETECTED Final   Escherichia  coli NOT DETECTED NOT DETECTED Final   Klebsiella aerogenes NOT DETECTED NOT DETECTED Final   Klebsiella oxytoca NOT DETECTED NOT DETECTED Final   Klebsiella pneumoniae NOT DETECTED NOT DETECTED Final   Proteus species NOT DETECTED NOT DETECTED Final   Salmonella species NOT DETECTED NOT DETECTED Final  Serratia marcescens NOT DETECTED NOT DETECTED Final   Haemophilus influenzae NOT DETECTED NOT DETECTED Final   Neisseria meningitidis NOT DETECTED NOT DETECTED Final   Pseudomonas aeruginosa NOT DETECTED NOT DETECTED Final   Stenotrophomonas maltophilia NOT DETECTED NOT DETECTED Final   Candida albicans NOT DETECTED NOT DETECTED Final   Candida auris NOT DETECTED NOT DETECTED Final   Candida glabrata NOT DETECTED NOT DETECTED Final   Candida krusei NOT DETECTED NOT DETECTED Final   Candida parapsilosis NOT DETECTED NOT DETECTED Final   Candida tropicalis NOT DETECTED NOT DETECTED Final   Cryptococcus neoformans/gattii NOT DETECTED NOT DETECTED Final   Methicillin resistance mecA/C DETECTED (A) NOT DETECTED Final    Comment: CRITICAL RESULT CALLED TO, READ BACK BY AND VERIFIED WITH: C,AMEND PHARMD @0652  02/06/21 EB Performed at [facility], [address], [place], [place] [address]   Resp Panel by RT-PCR (Flu A&B, Covid) Nasopharyngeal Swab     Status: None   Collection Time: 02/04/21  6:58 PM   Specimen: Nasopharyngeal Swab; Nasopharyngeal(NP) swabs in vial transport medium  Result Value Ref Range Status   SARS Coronavirus 2 by RT PCR NEGATIVE NEGATIVE Final    Comment: (NOTE) SARS-CoV-2 target nucleic acids are NOT DETECTED.  The SARS-CoV-2 RNA is generally detectable in upper respiratory specimens during the acute phase of infection. The lowest concentration of SARS-CoV-2 viral copies this assay can detect is 138 copies/mL. A negative result does not preclude SARS-Cov-2 infection and should not be used as the sole basis for treatment or other patient management decisions.  A negative result may occur with  improper specimen collection/handling, submission of specimen other than nasopharyngeal swab, presence of viral mutation(s) within the areas targeted by this assay, and inadequate number of viral copies(<138 copies/mL). A negative result must be combined with clinical observations, patient history, and epidemiological information. The expected result is Negative.  Fact Sheet for Patients:  BloggerCourse.com  Fact Sheet for Healthcare Providers:  SeriousBroker.it  This test is no t yet approved or cleared by the [place] FDA and  has been authorized for detection and/or diagnosis of SARS-CoV-2 by FDA under an Emergency Use Authorization (EUA). This EUA will remain  in effect (meaning this test can be used) for the duration of the COVID-19 declaration under Section 564(b)(1) of the Act, 21 U.S.C.section 360bbb-3(b)(1), unless the authorization is terminated  or revoked sooner.       Influenza A by PCR NEGATIVE NEGATIVE Final   Influenza B by PCR NEGATIVE NEGATIVE Final    Comment: (NOTE) The Xpert Xpress SARS-CoV-2/FLU/RSV plus assay is intended as an aid in the diagnosis of influenza from Nasopharyngeal swab specimens and should not be used as a sole basis for treatment. Nasal washings and aspirates are unacceptable for Xpert Xpress SARS-CoV-2/FLU/RSV testing.  Fact Sheet for Patients: BloggerCourse.com  Fact Sheet for Healthcare Providers: SeriousBroker.it  This test is not yet approved or cleared by the [place] FDA and has been authorized for detection and/or diagnosis of SARS-CoV-2 by FDA under an Emergency Use Authorization (EUA). This EUA will remain in effect (meaning this test can be used) for the duration of the COVID-19 declaration under Section 564(b)(1) of the Act, 21 U.S.C. section 360bbb-3(b)(1), unless the authorization  is terminated or revoked.  Performed at [facility] [address] [place], [place] [address]      Time coordinating discharge: Over 30 minutes  SIGNED:   Azucena Fallen, DO [facility] 02/06/2021, 12:23 PM Pager  If 7PM-7AM, please contact night-coverage [email or web address]

## 2021-02-06 NOTE — Progress Notes (Signed)
DISCHARGE NOTE HOME Shannon Oneal to be discharged Home per MD order. Discussed prescriptions and follow up appointments with the patient. Prescriptions given to patient; medication list explained in detail. Patient verbalized understanding.  Skin clean, dry and intact without evidence of skin break down, no evidence of skin tears noted. IV catheter discontinued intact. Site without signs and symptoms of complications. Dressing and pressure applied. Pt denies pain at the site currently. No complaints noted.  Patient free of lines, drains, and wounds.   An After Visit Summary (AVS) was printed and given to the patient. Patient escorted via wheelchair, and discharged home via private auto.  Katrina Stack, RN

## 2021-02-06 NOTE — Progress Notes (Signed)
ANTICOAGULATION CONSULT NOTE   Pharmacy Consult for Heparin Indication: pulmonary embolus and DVT on Xarelto PTA  Allergies  Allergen Reactions   Oxycodone Itching   Percocet [Oxycodone-Acetaminophen] Itching   Amoxapine And Related Other (See Comments)    Burns skin, hair falls out   Amoxicillin Other (See Comments)    Burns skin and hair falls out   Tramadol Itching   Rocephin [Ceftriaxone] Rash    swelling   Patient Measurements:   Heparin Dosing Weight: 82 kg  Vital Signs: Temp: 98 F (36.7 C) (07/16 2123) Temp Source: Oral (07/16 2123) BP: 104/70 (07/16 2123) Pulse Rate: 52 (07/16 2123)  Labs: Recent Labs    02/04/21 1421 02/04/21 1421 02/04/21 1950 02/05/21 0440 02/05/21 1017 02/06/21 0021  HGB 12.2  --   --  10.9*  --  10.8*  HCT 39.1  --   --  35.4*  --  35.1*  PLT 375  --   --  354  --  347  APTT  --    < > 26 43* 41* 95*  HEPARINUNFRC  --   --  >1.10*  --   --   --   CREATININE 0.73  --   --  0.74  --   --    < > = values in this interval not displayed.     Estimated Creatinine Clearance: 113.3 mL/min (by C-G formula based on SCr of 0.74 mg/dL).   Medical History: Past Medical History:  Diagnosis Date   Asthma    Diabetes mellitus without complication (HCC)    PE (pulmonary thromboembolism) (HCC) 2016    Medications:  Scheduled:   clindamycin  300 mg Oral Q6H   insulin aspart  0-20 Units Subcutaneous TID WC   insulin aspart  0-5 Units Subcutaneous QHS   insulin aspart  4 Units Subcutaneous TID WC   insulin glargine  20 Units Subcutaneous BID   Infusions:   heparin 1,800 Units/hr (02/05/21 1756)    Assessment: Last Xarelto 15mg  dose was 7/15 at 0930.   6/28 CT chest with bilateral pulmonary embolism and acute right lower extremity DVT.  There was no provoking factor.  At that time vascular surgery was consulted - did not recommend thrombolysis patient was discharged on Lovenox as she could not afford to pay for Xarelto or Eliquis. 7/3:  Anti-coagulation changed to Xarelto, starter pack 15mg  bid x 3 weeks, then 20mg  qd 7/10: to ED with worsening RLE swelling, rash. Treated topically for bacterial infection 7/15: to ED from [facility] for worsening RLE edema. ED physician discussed with vascular surgery Dr. today who does not think patient is a surgical candidate Plan admit to [facility] so vascular surgery can see her in consultation.    7/17 AM update:  aPTT therapeutic after rate increase  Goal of Therapy:  Heparin level 0.3-0.7 units/ml aPTT 66-102 seconds Monitor platelets by anticoagulation protocol: Yes    Plan:  Cont heparin at 1800 units/hr Confirmatory aPTT in 8 hours  Chestine Spore, PharmD, BCPS Clinical Pharmacist Phone: [phone]

## 2021-02-06 NOTE — Plan of Care (Signed)
  Problem: Education: Goal: Knowledge of General Education information will improve Description: Including pain rating scale, medication(s)/side effects and non-pharmacologic comfort measures 02/06/2021 1234 by Katrina Stack, RN Outcome: Adequate for Discharge 02/06/2021 0729 by Katrina Stack, RN Outcome: Progressing   Problem: Health Behavior/Discharge Planning: Goal: Ability to manage health-related needs will improve Outcome: Adequate for Discharge   Problem: Clinical Measurements: Goal: Ability to maintain clinical measurements within normal limits will improve 02/06/2021 1234 by Katrina Stack, RN Outcome: Adequate for Discharge 02/06/2021 0729 by Katrina Stack, RN Outcome: Progressing Goal: Will remain free from infection Outcome: Adequate for Discharge Goal: Diagnostic test results will improve Outcome: Adequate for Discharge Goal: Respiratory complications will improve Outcome: Adequate for Discharge Goal: Cardiovascular complication will be avoided Outcome: Adequate for Discharge   Problem: Activity: Goal: Risk for activity intolerance will decrease Outcome: Adequate for Discharge   Problem: Nutrition: Goal: Adequate nutrition will be maintained Outcome: Adequate for Discharge   Problem: Coping: Goal: Level of anxiety will decrease 02/06/2021 1234 by Katrina Stack, RN Outcome: Adequate for Discharge 02/06/2021 0729 by Katrina Stack, RN Outcome: Progressing   Problem: Elimination: Goal: Will not experience complications related to bowel motility Outcome: Adequate for Discharge Goal: Will not experience complications related to urinary retention Outcome: Adequate for Discharge   Problem: Pain Managment: Goal: General experience of comfort will improve Outcome: Adequate for Discharge   Problem: Safety: Goal: Ability to remain free from injury will improve Outcome: Adequate for Discharge   Problem: Skin Integrity: Goal: Risk for impaired  skin integrity will decrease Outcome: Adequate for Discharge

## 2021-02-07 LAB — CULTURE, BLOOD (ROUTINE X 2): Special Requests: ADEQUATE

## 2021-02-10 LAB — CULTURE, BLOOD (ROUTINE X 2)
Culture: NO GROWTH
Special Requests: ADEQUATE

## 2021-02-11 ENCOUNTER — Telehealth: Payer: Self-pay | Admitting: *Deleted

## 2021-02-11 NOTE — Telephone Encounter (Signed)
TCT patient regarding an application form through [facility] for her Xarelto. Spoke with patient and advised her that she would need to come to the cancer center and sign the forms and bring the other necessary documents (Copy of last Federal tax forms). Shannon Oneal states she will be by on Tuesday. Forms in an envekope left with receptionist @ the front desk.

## 2021-02-15 ENCOUNTER — Ambulatory Visit (INDEPENDENT_AMBULATORY_CARE_PROVIDER_SITE_OTHER): Payer: BC Managed Care – PPO | Admitting: Nurse Practitioner

## 2021-02-15 ENCOUNTER — Encounter: Payer: Self-pay | Admitting: Nurse Practitioner

## 2021-02-15 ENCOUNTER — Other Ambulatory Visit: Payer: Self-pay

## 2021-02-15 VITALS — BP 102/72 | HR 88 | Temp 97.8°F | Ht 65.5 in | Wt 233.8 lb

## 2021-02-15 DIAGNOSIS — I2699 Other pulmonary embolism without acute cor pulmonale: Secondary | ICD-10-CM

## 2021-02-15 DIAGNOSIS — I82401 Acute embolism and thrombosis of unspecified deep veins of right lower extremity: Secondary | ICD-10-CM | POA: Diagnosis not present

## 2021-02-15 DIAGNOSIS — Z794 Long term (current) use of insulin: Secondary | ICD-10-CM

## 2021-02-15 DIAGNOSIS — R600 Localized edema: Secondary | ICD-10-CM | POA: Diagnosis not present

## 2021-02-15 DIAGNOSIS — D500 Iron deficiency anemia secondary to blood loss (chronic): Secondary | ICD-10-CM

## 2021-02-15 DIAGNOSIS — E1165 Type 2 diabetes mellitus with hyperglycemia: Secondary | ICD-10-CM

## 2021-02-15 LAB — CBC WITH DIFFERENTIAL/PLATELET
Basophils Absolute: 0 10*3/uL (ref 0.0–0.1)
Basophils Relative: 0.5 % (ref 0.0–3.0)
Eosinophils Absolute: 0.2 10*3/uL (ref 0.0–0.7)
Eosinophils Relative: 2.5 % (ref 0.0–5.0)
HCT: 38.4 % (ref 36.0–46.0)
Hemoglobin: 12.1 g/dL (ref 12.0–15.0)
Lymphocytes Relative: 41.5 % (ref 12.0–46.0)
Lymphs Abs: 2.7 10*3/uL (ref 0.7–4.0)
MCHC: 31.5 g/dL (ref 30.0–36.0)
MCV: 75 fl — ABNORMAL LOW (ref 78.0–100.0)
Monocytes Absolute: 0.3 10*3/uL (ref 0.1–1.0)
Monocytes Relative: 5.2 % (ref 3.0–12.0)
Neutro Abs: 3.3 10*3/uL (ref 1.4–7.7)
Neutrophils Relative %: 50.3 % (ref 43.0–77.0)
Platelets: 312 10*3/uL (ref 150.0–400.0)
RBC: 5.12 Mil/uL — ABNORMAL HIGH (ref 3.87–5.11)
RDW: 15.2 % (ref 11.5–15.5)
WBC: 6.5 10*3/uL (ref 4.0–10.5)

## 2021-02-15 LAB — BASIC METABOLIC PANEL
BUN: 7 mg/dL (ref 6–23)
CO2: 25 mEq/L (ref 19–32)
Calcium: 9 mg/dL (ref 8.4–10.5)
Chloride: 104 mEq/L (ref 96–112)
Creatinine, Ser: 0.77 mg/dL (ref 0.40–1.20)
GFR: 96.25 mL/min (ref >60.00)
Glucose, Bld: 258 mg/dL — ABNORMAL HIGH (ref 70–99)
Potassium: 4.3 mEq/L (ref 3.5–5.1)
Sodium: 136 mEq/L (ref 135–145)

## 2021-02-15 MED ORDER — HYDROCODONE-ACETAMINOPHEN 5-325 MG PO TABS: 1.0000 | ORAL_TABLET | Freq: Four times a day (QID) | ORAL | 0 refills | Status: AC | PRN

## 2021-02-15 MED ORDER — TOUJEO SOLOSTAR 300 UNIT/ML ~~LOC~~ SOPN: 30.0000 [IU] | PEN_INJECTOR | Freq: Every day | SUBCUTANEOUS | 3 refills | Status: DC

## 2021-02-15 MED ORDER — FUROSEMIDE 20 MG PO TABS: 10.0000 mg | ORAL_TABLET | Freq: Every day | ORAL | 0 refills | Status: DC

## 2021-02-15 MED ORDER — FREESTYLE LIBRE READER DEVI: 1.0000 [IU] | Freq: Once | 0 refills | Status: AC

## 2021-02-15 MED ORDER — METFORMIN HCL 500 MG PO TABS: 500.0000 mg | ORAL_TABLET | Freq: Two times a day (BID) | ORAL | 1 refills | Status: DC

## 2021-02-15 MED ORDER — INSULIN ASPART 100 UNIT/ML FLEXPEN: PEN_INJECTOR | SUBCUTANEOUS | 11 refills | Status: DC

## 2021-02-15 MED ORDER — FREESTYLE LIBRE 14 DAY SENSOR MISC: 1.0000 [IU] | 11 refills | Status: DC

## 2021-02-15 NOTE — Patient Instructions (Signed)
Use hydrocodone as prescribed for pain Continue use of compression stocking daily and elevate leg as much as possible. Avoids all NSAIDs.  Start glucose check before meals and at bedtime.  Go to lab for blood draw.  Deep Vein Thrombosis  Deep vein thrombosis (DVT) is a condition in which a blood clot forms in a deep vein, such as a vein in the lower leg, thigh, pelvis, or arm. Deep veins are veins in the deep venous system. A clot is blood that has thickened into a gel or solid. This condition is serious and can be life-threatening if the clot travels to the lungs and causes a blockage (pulmonary embolism) in the arteries of the lung. A DVT can also damage veins in the leg. This can lead to long-term, or chronic, venous disease, leg pain, swelling, discoloration, and ulcers or sores (post-thrombotic syndrome). What are the causes? This condition may be caused by: A slowdown of blood flow. Damage to a vein. A condition that causes blood to clot more easily, such as certain blood-clotting disorders. What increases the risk? The following factors may make you more likely to develop this condition: Having obesity. Being older, especially older than age 85. Being inactive (sedentary lifestyle) or not moving around. This may include: Sitting or lying down for longer than 4-6 hours other than to sleep at night. Being in the hospital, having major or lengthy surgery, or having a thin, flexible tube (central line catheter) placed in a large vein. Being pregnant, giving birth, or having recently given birth. Taking medicines that contain estrogen, such as birth control or hormone replacement therapy. Using products that contain nicotine or tobacco, especially if you use hormonal birth control. Having a history of blood clots or a blood-clotting disease, a blood vessel disease (peripheral vascular disease), or congestive heart disease. Having a history of cancer, especially if being treated with  chemotherapy. What are the signs or symptoms? Symptoms of this condition include: Swelling, pain, pressure, or tenderness in an arm or a leg. An arm or a leg becoming warm, red, or discolored. A leg turning very pale. You may have a large DVT. This is rare. If the clot is in your leg, you may notice symptoms more or have worse symptomswhen you stand or walk. In some cases, there are no symptoms. How is this diagnosed? This condition is diagnosed with: Your medical history and a physical exam. Tests, such as: Blood tests to check how well your blood clots. Doppler ultrasound. This is the best way to find a DVT. Venogram. Contrast dye is injected into a vein, and X-rays are taken to check for clots. How is this treated? Treatment for this condition depends on: The cause of your DVT. The size and location of your DVT, or having more than one DVT. Your risk for bleeding or developing more clots. Other medical conditions you may have. Treatment may include: Taking a blood thinner, also called an anticoagulant, to prevent clots from forming and growing. Wearing compression stockings, if directed. Injecting medicines into the affected vein to break up the clot (catheter-directed thrombolysis). This is used only for severe DVT and only if a specialist recommends it. Specific surgical procedures, when DVT is severe or hard to treat. These may be done to: Isolate and remove your clot. Place an inferior vena cava (IVC) filter in a large vein to catch blood clots before they reach your lungs. You may get some medical treatments for 6 months or longer. Follow these instructions at  home: If you are taking blood thinners: Talk with your health care provider before you take any medicines that contain aspirin or NSAIDs, such as ibuprofen. These medicines increase your risk for dangerous bleeding. Take your medicine exactly as told, at the same time every day. Do not skip a dose. Do not take more than  the prescribed dose. This is important. Ask your health care provider about foods and medicines that could change the way your blood thinner works (may interact). Avoid these foods and medicines if you are told to do so. Avoid anything that may cause bleeding or bruising. You may bleed more easily while taking blood thinners. Be very careful when using knives, scissors, or other sharp objects. Use an electric razor instead of a blade. Avoid activities that could cause injury or bruising, and follow instructions for preventing falls. Tell your health care provider if you have had any internal bleeding, bleeding ulcers, or neurologic diseases, such as strokes or cerebral aneurysms. Wear a medical alert bracelet or carry a card that lists what medicines you take. General instructions Take over-the-counter and prescription medicines only as told by your health care provider. Return to your normal activities as told by your health care provider. Ask your health care provider what activities are safe for you. If recommended, wear compression stockings as told by your health care provider. These stockings help to prevent blood clots and reduce swelling in your legs. Keep all follow-up visits as told by your health care provider. This is important. Contact a health care provider if: You miss a dose of your blood thinner. You have new or worse pain, swelling, or redness in an arm or a leg. You have worsening numbness or tingling in an arm or a leg. You have unusual bruising. Get help right away if: You have signs or symptoms that a blood clot has moved to the lungs. These may include: Shortness of breath. Chest pain. Fast or irregular heartbeats (palpitations). Light-headedness or dizziness. Coughing up blood. You have signs or symptoms that your blood is too thin. These may include: Blood in your vomit, stool, or urine. A cut that will not stop bleeding. A menstrual period that is heavier than  usual. A severe headache or confusion. These symptoms may represent a serious problem that is an emergency. Do not wait to see if the symptoms will go away. Get medical help right away. Call your local emergency services (911 in the [place] Do not drive yourself to the hospital. Summary Deep vein thrombosis (DVT) happens when a blood clot forms in a deep vein. This may occur in the lower leg, thigh, pelvis, or arm. Symptoms affect the arm or leg and can include swelling, pain, tenderness, warmth, redness, or discoloration. This condition may be treated with medicines or compression stockings. In severe cases, surgery may be done. If you are taking blood thinners, take them exactly as told. Do not skip a dose. Do not take more than is prescribed. Get help right away if you have shortness of breath, chest pain, fast or irregular heartbeats, or blood in your vomit, urine, or stool. This information is not intended to replace advice given to you by your health care provider. Make sure you discuss any questions you have with your healthcare provider. Document Revised: 07/05/2019 Document Reviewed: 07/05/2019 Elsevier Patient Education  2022 [employer].

## 2021-02-15 NOTE — Assessment & Plan Note (Addendum)
Uncontrolled with HgbA1c at 11.6% Inconsistent management due to lack of insurance coverage. Has been out of medication x 67month Needs insulin and glucometer sent: resume toujeo 30units and start metformin 500mg  BID. New rx sent. F/up in 2weeks

## 2021-02-15 NOTE — Assessment & Plan Note (Addendum)
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 First incident of PE in 2016 (resolved with eliquis then pradaxa x 32months). New PE and DVT diagnosed 12/2020: started on xarelto and has appt with hematology. 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 No tobacco use. She denies any GI/GU bleed, no menorrhagia, no CP or SOB She has persistent pain and swelling in R. LE. Minimal relief with leg elevation, compression stocking and hydrocodone and tylenol. She is unable to tolerate oxycodone and tramadol (itching). PMP database reviewed: hydrocodone last filled 02/09/2019, #20  Refill hydrocodone Add furosemide Repeat cbc and BMP F/up in 2weeks Maintain appt with hematology

## 2021-02-15 NOTE — Progress Notes (Signed)
Subjective:  Patient ID: Shannon Oneal, female    DOB: [birth year]  Age: 40 y.o. MRN: [id number]  CC: Establish Care (New patient/Hospital f/u/DVT/right leg still painful and swollen. 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 First incident of PE in 2016 (resolved with eliquis then pradaxa x 68months). New PE and DVT diagnosed 12/2020: started on xarelto and has appt with hematology.  She is married ans sexually active with use of condoms. She denies any GI/GU bleed, no menorrhagia, no CP or SOB She has persistent pain and swelling in R. LE. Minimal relief with leg elevation, compression stocking and hydrocodone and tylenol. She is unable to tolerate oxycodone and tramadol (itching). PMP database reviewed: hydrocodone last filled 02/09/2019, #20  Refill hydrocodone Add furosemide Repeat cbc and BMP F/up in 2weeks Maintain appt with hematology  Type 2 diabetes mellitus with hyperglycemia (HCC) Uncontrolled Needs insulin and glucometer sent: resume toujeo 30units and start metformin 500mg  BID  Reviewed past Medical, Social and Family history today.  Outpatient Medications Prior to Visit  Medication Sig Dispense Refill   acetaminophen (TYLENOL) 325 MG tablet Take 2 tablets (650 mg total) by mouth every 6 (six) hours as needed for mild pain, fever or headache. 30 tablet 0   Insulin Pen Needle 31G X 5 MM MISC Three times daily 90 each 0   mupirocin cream (BACTROBAN) 2 % Apply 1 application topically 2 (two) times daily. Apply to rash on legs 15 g 0   RIVAROXABAN (XARELTO) VTE STARTER PACK (15 & 20 MG) Follow package directions: Take one 15mg  tablet by mouth twice a day. On day 22, switch to one 20mg  tablet once a day. Take with food. 51 each 0   insulin aspart (NOVOLOG) 100 UNIT/ML FlexPen Check your blood sugar prior to your meal, three times daily. Then depending on your blood sugar, inject insulin as follows: Blood Glucose 121 - 150: 1 unit. BG 151 -  200: 2 units. BG 201 - 250: 3 units. BG 251 - 300: 5 units. BG 301 - 350: 7 units. BG 351 - 400: 9 units 15 mL 11   TOUJEO SOLOSTAR 300 UNIT/ML Solostar Pen Inject 30 Units into the skin in the morning and at bedtime. 1.5 mL 3   furosemide (LASIX) 20 MG tablet Take 1 tablet (20 mg total) by mouth daily. 14 tablet 0   HYDROcodone-acetaminophen (NORCO/VICODIN) 5-325 MG tablet Take 1 tablet by mouth every 6 (six) hours as needed for moderate pain or severe pain. (Patient not taking: Reported on 02/15/2021) 30 tablet 0   No facility-administered medications prior to visit.    ROS See HPI  Objective:  BP 102/72 (BP Location: Left Arm, Patient Position: Sitting, Cuff Size: Large)   Pulse 88   Temp 97.8 F (36.6 C) (Temporal)   Ht 5' 5.5" (1.664 m)   Wt 233 lb 12.8 oz (106.1 kg)   SpO2 97%   BMI 38.31 kg/m   Physical Exam Constitutional:      Appearance: She is obese.  Cardiovascular:     Rate and Rhythm: Normal rate and regular rhythm.     Pulses: Normal pulses.     Heart sounds: Normal heart sounds.  Pulmonary:     Effort: Pulmonary effort is normal.     Breath sounds: Normal breath sounds.  Musculoskeletal:        General: Tenderness present.     Right lower leg: Edema present.  Skin:  Findings: No erythema or rash.  Neurological:     Mental Status: She is alert and oriented to person, place, and time.   Assessment & Plan:  This visit occurred during the SARS-CoV-2 public health emergency.  Safety protocols were in place, including screening questions prior to the visit, additional usage of staff PPE, and extensive cleaning of exam room while observing appropriate contact time as indicated for disinfecting solutions.   Shannon Oneal was seen today for establish care.  Diagnoses and all orders for this visit:  Acute deep vein thrombosis (DVT) of right lower extremity, unspecified vein (HCC) -     HYDROcodone-acetaminophen (NORCO/VICODIN) 5-325 MG tablet; Take 1 tablet by mouth  every 6 (six) hours as needed for up to 5 days for moderate pain or severe pain.  Type 2 diabetes mellitus with hyperglycemia, with long-term current use of insulin (HCC) -     Basic metabolic panel -     TOUJEO SOLOSTAR 300 UNIT/ML Solostar Pen; Inject 30 Units into the skin daily. -     Discontinue: insulin aspart (NOVOLOG) 100 UNIT/ML FlexPen; Check your blood sugar prior to your meal, three times daily. Then depending on your blood sugar, inject insulin as follows: Blood Glucose 121 - 150: 1 unit. BG 151 - 200: 2 units. BG 201 - 250: 3 units. BG 251 - 300: 5 units. BG 301 - 350: 7 units. BG 351 - 400: 9 units -     Continuous Blood Gluc Receiver (FREESTYLE LIBRE READER) DEVI; 1 Units by Does not apply route once for 1 dose. -     Continuous Blood Gluc Sensor (FREESTYLE LIBRE 14 DAY SENSOR) MISC; 1 Units by Does not apply route every 14 (fourteen) days. -     metFORMIN (GLUCOPHAGE) 500 MG tablet; Take 1 tablet (500 mg total) by mouth 2 (two) times daily with a meal.  Leg edema, right -     Discontinue: furosemide (LASIX) 20 MG tablet; Take 0.5 tablets (10 mg total) by mouth daily. -     HYDROcodone-acetaminophen (NORCO/VICODIN) 5-325 MG tablet; Take 1 tablet by mouth every 6 (six) hours as needed for up to 5 days for moderate pain or severe pain.  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 First incident of PE in 2016 (resolved with eliquis then pradaxa x 41months). New PE and DVT diagnosed 12/2020: started on xarelto and has appt with hematology.  She is married ans sexually active with use of condoms. She denies any GI/GU bleed, no menorrhagia, no CP or SOB She has persistent pain and swelling in R. LE. Minimal relief with leg elevation,  compression stocking and hydrocodone and tylenol. She is unable to tolerate oxycodone and tramadol (itching). PMP database reviewed: hydrocodone last filled 02/09/2019, #20  Refill hydrocodone Add furosemide Repeat cbc and BMP F/up in 2weeks Maintain appt with hematology       Relevant Medications   furosemide (LASIX) 20 MG tablet   Deep vein thrombosis (DVT) of right lower extremity (HCC) - Primary   Relevant Medications   HYDROcodone-acetaminophen (NORCO/VICODIN) 5-325 MG tablet   furosemide (LASIX) 20 MG tablet     Endocrine   Type 2 diabetes mellitus  with hyperglycemia (HCC)    Uncontrolled Needs insulin and glucometer sent: resume toujeo 30units and start metformin 500mg  BID       Relevant Medications   TOUJEO SOLOSTAR 300 UNIT/ML Solostar Pen   Continuous Blood Gluc Receiver (FREESTYLE LIBRE READER) DEVI   Continuous Blood Gluc Sensor (FREESTYLE LIBRE 14 DAY SENSOR) MISC   metFORMIN (GLUCOPHAGE) 500 MG tablet   Other Relevant Orders   Basic metabolic panel (Completed)   Other Visit Diagnoses     Leg edema, right       Relevant Medications   HYDROcodone-acetaminophen (NORCO/VICODIN) 5-325 MG tablet   furosemide (LASIX) 20 MG tablet   Iron deficiency anemia due to chronic blood loss       Relevant Orders   CBC w/Diff (Completed)   Iron, TIBC and Ferritin Panel       Follow-up: Return in about 2 weeks (around 03/01/2021) for DVT, DM and leg pain (05/01/2021).  , NP

## 2021-02-16 LAB — IRON,TIBC AND FERRITIN PANEL
%SAT: 24 % (calc) (ref 16–45)
Ferritin: 53 ng/mL (ref 16–154)
Iron: 54 ug/dL (ref 40–190)
TIBC: 225 mcg/dL (calc) — ABNORMAL LOW (ref 250–450)

## 2021-02-17 ENCOUNTER — Encounter: Payer: Self-pay | Admitting: Physician Assistant

## 2021-02-17 NOTE — Progress Notes (Signed)
Patient called to obtain fax number for office to have income for Xarelto assistance faxed.  Provided the fax number and advised to attention to Luan Pulling (RN) whom was working on for patient. She verbalized understanding.  She has my contact name and number for any additional financial questions or concerns.

## 2021-02-24 ENCOUNTER — Telehealth: Payer: Self-pay

## 2021-02-24 DIAGNOSIS — I2699 Other pulmonary embolism without acute cor pulmonale: Secondary | ICD-10-CM

## 2021-02-24 DIAGNOSIS — I82591 Chronic embolism and thrombosis of other specified deep vein of right lower extremity: Secondary | ICD-10-CM

## 2021-02-24 MED ORDER — RIVAROXABAN 20 MG PO TABS: 20.0000 mg | ORAL_TABLET | Freq: Every day | ORAL | 2 refills | Status: DC

## 2021-02-24 NOTE — Telephone Encounter (Signed)
Sent xarelto 20mg . Maintain upcoming appt on 03/07/2021

## 2021-02-24 NOTE — Telephone Encounter (Signed)
Patient states the swelling in her leg has not gone down at all and she is very concerned about it. She also is out of her medication (Xarelto) and would like a refill sent into her pharmacy as soon as possible.

## 2021-02-25 NOTE — Telephone Encounter (Signed)
Per patient request LVM informing her medication was at the pharmacy.

## 2021-02-28 ENCOUNTER — Encounter (HOSPITAL_COMMUNITY): Payer: Self-pay

## 2021-02-28 ENCOUNTER — Other Ambulatory Visit: Payer: Self-pay

## 2021-02-28 ENCOUNTER — Emergency Department (HOSPITAL_COMMUNITY)
Admission: EM | Admit: 2021-02-28 | Discharge: 2021-03-01 | Disposition: A | Discharge status: Home / Self Care | Payer: BC Managed Care – PPO | Attending: Emergency Medicine | Admitting: Emergency Medicine

## 2021-02-28 DIAGNOSIS — E119 Type 2 diabetes mellitus without complications: Secondary | ICD-10-CM | POA: Insufficient documentation

## 2021-02-28 DIAGNOSIS — M79604 Pain in right leg: Secondary | ICD-10-CM | POA: Diagnosis not present

## 2021-02-28 DIAGNOSIS — J45909 Unspecified asthma, uncomplicated: Secondary | ICD-10-CM | POA: Insufficient documentation

## 2021-02-28 DIAGNOSIS — I824Y1 Acute embolism and thrombosis of unspecified deep veins of right proximal lower extremity: Secondary | ICD-10-CM

## 2021-02-28 DIAGNOSIS — I82401 Acute embolism and thrombosis of unspecified deep veins of right lower extremity: Secondary | ICD-10-CM | POA: Diagnosis not present

## 2021-02-28 LAB — CBC WITH DIFFERENTIAL/PLATELET
Abs Immature Granulocytes: 0.01 10*3/uL (ref 0.00–0.07)
Basophils Absolute: 0 10*3/uL (ref 0.0–0.1)
Basophils Relative: 0 %
Eosinophils Absolute: 0.2 10*3/uL (ref 0.0–0.5)
Eosinophils Relative: 2 %
HCT: 38.1 % (ref 36.0–46.0)
Hemoglobin: 11.7 g/dL — ABNORMAL LOW (ref 12.0–15.0)
Immature Granulocytes: 0 %
Lymphocytes Relative: 43 %
Lymphs Abs: 3.6 10*3/uL (ref 0.7–4.0)
MCH: 23.5 pg — ABNORMAL LOW (ref 26.0–34.0)
MCHC: 30.7 g/dL (ref 30.0–36.0)
MCV: 76.7 fL — ABNORMAL LOW (ref 80.0–100.0)
Monocytes Absolute: 0.6 10*3/uL (ref 0.1–1.0)
Monocytes Relative: 7 %
Neutro Abs: 4 10*3/uL (ref 1.7–7.7)
Neutrophils Relative %: 48 %
Platelets: 339 10*3/uL (ref 150–400)
RBC: 4.97 MIL/uL (ref 3.87–5.11)
RDW: 14.3 % (ref 11.5–15.5)
WBC: 8.3 10*3/uL (ref 4.0–10.5)
nRBC: 0 % (ref 0.0–0.2)

## 2021-02-28 LAB — COMPREHENSIVE METABOLIC PANEL
ALT: 19 U/L (ref 0–44)
AST: 19 U/L (ref 15–41)
Albumin: 3.3 g/dL — ABNORMAL LOW (ref 3.5–5.0)
Alkaline Phosphatase: 56 U/L (ref 38–126)
Anion gap: 7 (ref 5–15)
BUN: 11 mg/dL (ref 6–20)
CO2: 26 mmol/L (ref 22–32)
Calcium: 8.9 mg/dL (ref 8.9–10.3)
Chloride: 107 mmol/L (ref 98–111)
Creatinine, Ser: 0.86 mg/dL (ref 0.44–1.00)
GFR, Estimated: >60 mL/min (ref >60)
Glucose, Bld: 181 mg/dL — ABNORMAL HIGH (ref 70–99)
Potassium: 3.9 mmol/L (ref 3.5–5.1)
Sodium: 140 mmol/L (ref 135–145)
Total Bilirubin: 0.5 mg/dL (ref 0.3–1.2)
Total Protein: 7.4 g/dL (ref 6.5–8.1)

## 2021-02-28 NOTE — ED Notes (Signed)
Pt ambulatory in ED lobby. 

## 2021-02-28 NOTE — ED Provider Notes (Signed)
Emergency Medicine Provider Triage Evaluation Note  Shannon Oneal , a 40 y.o. female  was evaluated in triage.  Pt complains of Right leg swelling and pain.  Was recently hospitalized with PEs and DVT in the right leg, she is on Xarelto.  She has been on Xarelto for 7 weeks and the swelling has not decreased and is now more painful to the top of her foot.    Review of Systems  Positive: Leg swelling, leg pain Negative: Shortness of breath, chest pain  Physical Exam  BP 139/87 (BP Location: Left Arm)   Pulse 93   Temp 98.8 F (37.1 C) (Oral)   Resp 18   Ht 5' 5.5" (1.664 m)   Wt 107 kg   SpO2 98%   BMI 38.68 kg/m  Gen:   Awake, no distress   Resp:  Normal effort  MSK:   Moves extremities without difficulty  Other:  Right leg and foot with significant edema, grossly larger than left.   Medical Decision Making  Medically screening exam initiated at 10:29 PM.  Appropriate orders placed.  Shannon Oneal was informed that the remainder of the evaluation will be completed by another provider, this initial triage assessment does not replace that evaluation, and the importance of remaining in the ED until their evaluation is complete.     Theron Arista, PA-C 02/28/21 2232    Margarita Grizzle, MD 02/28/21 2352

## 2021-02-28 NOTE — ED Triage Notes (Signed)
Pt reports swelling and warmth to right leg and foot. Pt states that she was admitted for PE's, and DVT's  month ago. Pt states that she is taking her Xarelto but it is not getting better.

## 2021-03-01 ENCOUNTER — Emergency Department (HOSPITAL_BASED_OUTPATIENT_CLINIC_OR_DEPARTMENT_OTHER): Payer: BC Managed Care – PPO

## 2021-03-01 DIAGNOSIS — Z86711 Personal history of pulmonary embolism: Secondary | ICD-10-CM | POA: Diagnosis not present

## 2021-03-01 DIAGNOSIS — M79604 Pain in right leg: Secondary | ICD-10-CM

## 2021-03-01 DIAGNOSIS — Z7901 Long term (current) use of anticoagulants: Secondary | ICD-10-CM | POA: Diagnosis not present

## 2021-03-01 DIAGNOSIS — R600 Localized edema: Secondary | ICD-10-CM | POA: Diagnosis not present

## 2021-03-01 DIAGNOSIS — M7989 Other specified soft tissue disorders: Secondary | ICD-10-CM

## 2021-03-01 DIAGNOSIS — E119 Type 2 diabetes mellitus without complications: Secondary | ICD-10-CM | POA: Diagnosis not present

## 2021-03-01 DIAGNOSIS — Z881 Allergy status to other antibiotic agents status: Secondary | ICD-10-CM | POA: Diagnosis not present

## 2021-03-01 DIAGNOSIS — I825Y1 Chronic embolism and thrombosis of unspecified deep veins of right proximal lower extremity: Secondary | ICD-10-CM | POA: Diagnosis not present

## 2021-03-01 MED ORDER — HYDROCODONE-ACETAMINOPHEN 5-325 MG PO TABS: 1.0000 | ORAL_TABLET | Freq: Four times a day (QID) | ORAL | 0 refills | Status: DC | PRN

## 2021-03-01 MED ORDER — HYDROCODONE-ACETAMINOPHEN 5-325 MG PO TABS
1.0000 | ORAL_TABLET | Freq: Once | ORAL | Status: AC
  Administered 2021-03-01: 1 via ORAL
  Filled 2021-03-01: qty 1

## 2021-03-01 NOTE — Progress Notes (Signed)
Called by ER regarding pt with leg swelling.  She has had known DVT for 6 weeks.  She has been on anticoagulation.  She does not have phlegmasia per ER exam today.  She is receiving standard of care treatment with anticoagulation and is being followed by Hematology.  She has previously been seen by me and Dr Chestine Spore and these findings discussed.  We will set her up for outpatient appointment for further follow up.  Fabienne Bruns, MD [facility] Office: [phone]

## 2021-03-01 NOTE — Progress Notes (Signed)
RLE venous duplex has been completed.  Preliminary results given to Dr. Estell Harpin.  Results can be found under chart review under CV PROC. 03/01/2021 9:29 AM Bazil Dhanani RVT, RDMS

## 2021-03-01 NOTE — ED Provider Notes (Signed)
[facility]-EMERGENCY DEPT [facility] Emergency Department Provider Note MRN:  [id number]  Arrival date & time: 03/01/21     Chief Complaint   DVT   History of Present Illness   Shannon Oneal is a 40 y.o. year-old female with a history of DVT presenting to the ED with chief complaint of leg pain.  Right leg pain and swelling ever since she was diagnosed with a blood clot a few months ago, pain and swelling seems to be worsening over the past 6 days.  Pain is constant, moderate, worse with motion or palpation.  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  Denies fever, no chest pain or shortness of breath, no other complaints.  Review of Systems  A complete 10 system review of systems was obtained and all systems are negative except as noted in the HPI and PMH.   Patient's Health History    Past Medical History:  Diagnosis Date   Acute deep vein thrombosis (DVT) of right lower extremity (HCC) 01/18/2021   Acute pulmonary embolism (HCC) 01/19/2021   Asthma    Cellulitis in diabetic foot (HCC)    Diabetes mellitus without complication (HCC)    DVT (deep venous thrombosis) (HCC) 02/05/2021   PE (pulmonary thromboembolism) (HCC) 2016    History reviewed. No pertinent surgical history.  Family History  Problem Relation Age of Onset   Deep vein thrombosis Father     Social History   Socioeconomic History   Marital status: Married    Spouse name: Not on file   Number of children: Not on file   Years of education: Not on file   Highest education level: Not on file  Occupational History   Not on file  Tobacco Use   Smoking status: Never   Smokeless tobacco: Never  Vaping Use   Vaping Use: Former   Substances: CBD  Substance and Sexual Activity   Alcohol use: Yes   Drug use: Never   Sexual activity: Not on file  Other Topics Concern   Not on file  Social History Narrative   Not on file   Social Determinants of Health   Financial  Resource Strain: Not on file  Food Insecurity: Not on file  Transportation Needs: Not on file  Physical Activity: Not on file  Stress: Not on file  Social Connections: Not on file  Intimate Partner Violence: Not on file     Physical Exam   Vitals:   03/01/21 0445 03/01/21 0530  BP: (!) 132/94 124/89  Pulse: 84 80  Resp: 19 16  Temp:  98 F (36.7 C)  SpO2: 100% 100%    CONSTITUTIONAL: Well-appearing, NAD NEURO:  Alert and oriented x 3, no focal deficits EYES:  eyes equal and reactive ENT/NECK:  no LAD, no JVD CARDIO: Regular rate, well-perfused, normal S1 and S2 PULM:  CTAB no wheezing or rhonchi GI/GU:  normal bowel sounds, non-distended, non-tender MSK/SPINE:  No gross deformities, swollen and tender right lower extremity SKIN:  no rash, atraumatic PSYCH:  Appropriate speech and behavior  *Additional and/or pertinent findings included in MDM below  Diagnostic and Interventional Summary    EKG Interpretation  Date/Time:    Ventricular Rate:    PR Interval:    QRS Duration:   QT Interval:    QTC Calculation:   R Axis:     Text Interpretation:         Labs Reviewed  CBC WITH DIFFERENTIAL/PLATELET -  Abnormal; Notable for the following components:      Result Value   Hemoglobin 11.7 (*)    MCV 76.7 (*)    MCH 23.5 (*)    All other components within normal limits  COMPREHENSIVE METABOLIC PANEL - Abnormal; Notable for the following components:   Glucose, Bld 181 (*)    Albumin 3.3 (*)    All other components within normal limits    VAS [place] LOWER EXTREMITY VENOUS (DVT) (ONLY [facility] & [facility])    (Results Pending)    Medications  HYDROcodone-acetaminophen (NORCO/VICODIN) 5-325 MG per tablet 1 tablet (1 tablet Oral Given 03/01/21 0140)     Procedures  /  Critical Care Procedures  ED Course and Medical Decision Making  I have reviewed the triage vital signs, the nursing notes, and pertinent available records from the EMR.  Listed above are laboratory and imaging  tests that I personally ordered, reviewed, and interpreted and then considered in my medical decision making (see below for details).  Suspect continued pain related to chronic DVT.  Had a readmission for this similar issue back in July.  Vital signs are normal, no chest pain or shortness of breath, explains she has been taking her Xarelto as directed.  I was able to Doppler both the DP and PT pulses and they are strong and easy to find.  Nothing to suggest phlegmasia cerulea dolens or any vascular compromise.  No evidence of infection on my exam, no fever, labs are reassuring.  Will consult hospital service given that they admitted the patient for the same issue back in July to see if they have any other ideas.  Seems that they switched her anticoagulation to heparin while she was admitted.     Discussed case with Dr. Toniann Fail, plan is to obtain formal ultrasound of the leg.  If DVT is expanding despite anticoagulation patient should be admitted.  If stable patient can be discharged.  Has follow-up with vascular surgery in a few days.  Signed out to oncoming provider at shift change.  Elmer Sow. Pilar Plate, MD [facility] Emergency Medicine [facility] [email or web address]  Final Clinical Impressions(s) / ED Diagnoses     ICD-10-CM   1. Deep vein thrombosis (DVT) of proximal vein of right lower extremity, unspecified chronicity (HCC)  I82.4Y1       ED Discharge Orders     None        Discharge Instructions Discussed with and Provided to Patient:   Discharge Instructions   None       Sabas Sous, MD 03/01/21 [phone]

## 2021-03-01 NOTE — Discharge Instructions (Addendum)
Dr. Darrick Penna office will give you a call to set up an appointment.  If they do not call you the next couple days you can call them.  Keep your leg elevated continue your medicines

## 2021-03-01 NOTE — ED Provider Notes (Signed)
I spoke with Dr. Darrick Penna of vascular surgery and he states they will follow her up in the office.  He stated right now just continue present treatment   Bethann Berkshire, MD 03/01/21 1026

## 2021-03-02 ENCOUNTER — Other Ambulatory Visit: Payer: Self-pay

## 2021-03-02 DIAGNOSIS — I82421 Acute embolism and thrombosis of right iliac vein: Secondary | ICD-10-CM

## 2021-03-07 ENCOUNTER — Ambulatory Visit: Payer: BC Managed Care – PPO | Admitting: Nurse Practitioner

## 2021-03-09 ENCOUNTER — Other Ambulatory Visit: Payer: Self-pay

## 2021-03-09 ENCOUNTER — Ambulatory Visit (INDEPENDENT_AMBULATORY_CARE_PROVIDER_SITE_OTHER): Payer: BC Managed Care – PPO | Admitting: Family Medicine

## 2021-03-09 ENCOUNTER — Encounter: Payer: Self-pay | Admitting: Family Medicine

## 2021-03-09 VITALS — BP 104/64 | HR 111 | Temp 98.3°F | Ht 65.0 in | Wt 233.6 lb

## 2021-03-09 DIAGNOSIS — I2699 Other pulmonary embolism without acute cor pulmonale: Secondary | ICD-10-CM

## 2021-03-09 DIAGNOSIS — I82401 Acute embolism and thrombosis of unspecified deep veins of right lower extremity: Secondary | ICD-10-CM

## 2021-03-09 NOTE — Progress Notes (Signed)
[facility] [address] [place] [address] Dept: [phone] Dept Fax: [phone]  Office Visit  Subjective:    Patient ID: Shannon Oneal, female    DOB: [birth year], 40 y.o..   MRN: [id number]  Chief Complaint  Patient presents with   Follow-up    Pt c/o swelling, pain and DVT in right leg x 22months    History of Present Illness:  Patient is in today for reassessment related to her persistent right lower leg DVT. Ms. Mooneyhan had experienced apparently unprovoked bilateral pulmonary emboli in 2016. After these resolved, she was taken off of anticoagulants. In July of this year, she developed a sudden onset of right leg swelling and pain. When seen in the ER, she was Oneal to have new pulmonary emboli as well. She was initially managed on Eliquis, but apparently was showing no sign of resolution. She is now on Xarelto. Earlier this week, she was having increased pain and swelling, so went back to an ER that is part of the [facility] system. She notes a repeat ultrasound showed her clot to be unchanged. She has an appointment later this week with the vascular surgeons with [facility]. However, she also has an appointment on 9/16 with the [facility] vascular team. She notes the [facility] physicians had felt surgery was not indicated, but the [facility] team is willing to consider surgical approaches for thrombectomy.  Past Medical History: Patient Active Problem List   Diagnosis Date Noted   Bilateral pulmonary embolism (HCC) 02/04/2021   Deep vein thrombosis (DVT) of right lower extremity (HCC) 02/04/2021   Right leg pain 02/04/2021   Type 2 diabetes mellitus with hyperglycemia (HCC) 01/18/2021   History reviewed. No pertinent surgical history.  Family History  Problem Relation Age of Onset   Deep vein thrombosis Father    Outpatient Medications Prior to Visit  Medication Sig Dispense Refill   acetaminophen (TYLENOL) 325 MG tablet Take 2 tablets (650 mg  total) by mouth every 6 (six) hours as needed for mild pain, fever or headache. 30 tablet 0   Continuous Blood Gluc Sensor (FREESTYLE LIBRE 14 DAY SENSOR) MISC 1 Units by Does not apply route every 14 (fourteen) days. 2 each 11   cyclobenzaprine (FLEXERIL) 10 MG tablet Take by mouth.     furosemide (LASIX) 20 MG tablet Take 1 tablet (20 mg total) by mouth daily. 14 tablet 0   HYDROcodone-acetaminophen (NORCO/VICODIN) 5-325 MG tablet Take 1 tablet by mouth every 6 (six) hours as needed for moderate pain. 20 tablet 0   Insulin Pen Needle 31G X 5 MM MISC Three times daily 90 each 0   metFORMIN (GLUCOPHAGE) 500 MG tablet Take 1 tablet (500 mg total) by mouth 2 (two) times daily with a meal. 180 tablet 1   mupirocin cream (BACTROBAN) 2 % Apply 1 application topically 2 (two) times daily. Apply to rash on legs 15 g 0   rivaroxaban (XARELTO) 20 MG TABS tablet Take 1 tablet (20 mg total) by mouth daily with supper. 30 tablet 2   TOUJEO SOLOSTAR 300 UNIT/ML Solostar Pen Inject 30 Units into the skin daily. 1.5 mL 3   No facility-administered medications prior to visit.   Allergies  Allergen Reactions   Amoxapine And Related Other (See Comments)    Burns skin, hair falls out   Amoxicillin Other (See Comments)    Burns skin and hair falls out   Oxycodone Itching   Percocet [Oxycodone-Acetaminophen] Itching   Rocephin [Ceftriaxone]  Rash    swelling   Tramadol Itching   Objective:   Today's Vitals   03/09/21 1432  BP: 104/64  Pulse: (!) 111  Temp: 98.3 F (36.8 C)  TempSrc: Oral  SpO2: 98%  Weight: 233 lb 9.6 oz (106 kg)  Height: 5\' 5"  (1.651 m)   Body mass index is 38.87 kg/m.   General: Well developed, well nourished. No acute distress. Extremities: Marked swelling of the right lower leg with pain to even light touch. Psych: Alert and oriented. Normal mood and affect.  Health Maintenance Due  Topic Date Due   PNEUMOCOCCAL POLYSACCHARIDE VACCINE AGE 73-64 HIGH RISK  Never done    FOOT EXAM  Never done   OPHTHALMOLOGY EXAM  Never done   URINE MICROALBUMIN  Never done   Hepatitis C Screening  Never done   PAP SMEAR-Modifier  Never done   INFLUENZA VACCINE  02/21/2021   Assessment & Plan:   1. Acute deep vein thrombosis (DVT) of right lower extremity, unspecified vein (HCC) 2. Bilateral pulmonary embolism (HCC)  Continue Xarelto. I agree with her keeping both pending vascular appointments, so that she can be provided a broad range of options for resolving her issue. We did discuss her need for lifelong anticoagulation. She will follow-up with Ms. [name] or myself as needed.  04/23/2021, MD

## 2021-03-11 ENCOUNTER — Ambulatory Visit (HOSPITAL_COMMUNITY)
Admission: RE | Admit: 2021-03-11 | Discharge: 2021-03-11 | Disposition: A | Discharge status: Home / Self Care | Payer: BC Managed Care – PPO | Source: Ambulatory Visit | Attending: Vascular Surgery | Admitting: Vascular Surgery

## 2021-03-11 ENCOUNTER — Encounter: Payer: Self-pay | Admitting: Vascular Surgery

## 2021-03-11 ENCOUNTER — Ambulatory Visit (INDEPENDENT_AMBULATORY_CARE_PROVIDER_SITE_OTHER): Payer: BC Managed Care – PPO | Admitting: Vascular Surgery

## 2021-03-11 ENCOUNTER — Other Ambulatory Visit: Payer: Self-pay

## 2021-03-11 VITALS — BP 116/81 | HR 84 | Temp 97.8°F | Resp 20 | Ht 65.0 in | Wt 234.0 lb

## 2021-03-11 DIAGNOSIS — I87001 Postthrombotic syndrome without complications of right lower extremity: Secondary | ICD-10-CM

## 2021-03-11 DIAGNOSIS — I82421 Acute embolism and thrombosis of right iliac vein: Secondary | ICD-10-CM | POA: Diagnosis not present

## 2021-03-11 NOTE — Progress Notes (Signed)
Patient ID: Shannon Oneal, female   DOB: 1981/05/25, 40 y.o.   MRN: [id number]  Reason for Consult: Follow-up   Referred by [name], Bonna Gains, NP  Subjective:     HPI:  Shannon Oneal is a 40 y.o. female history of right lower extremity DVT.  She does have a remote history of a pulmonary embolus 6 years ago.  She has no family history of clotting disorder and has no personal family history of clotting disorder.  She has been seen twice now for right lower extremity swelling.  She does wear compression stockings when she can get them over her ankle.  She does not have any skin changes or ulceration.  She is taking Xarelto as prescribed.  She states that her swelling does get better at night but worsens quickly throughout the day.  She is also using Ace wraps on her legs to help with compression.  Past Medical History:  Diagnosis Date   Acute deep vein thrombosis (DVT) of right lower extremity (HCC) 01/18/2021   Acute pulmonary embolism (HCC) 01/19/2021   Asthma    Cellulitis in diabetic foot (HCC)    Diabetes mellitus without complication (HCC)    DVT (deep venous thrombosis) (HCC) 02/05/2021   PE (pulmonary thromboembolism) (HCC) 2016   Family History  Problem Relation Age of Onset   Deep vein thrombosis Father    History reviewed. No pertinent surgical history.  Short Social History:  Social History   Tobacco Use   Smoking status: Never   Smokeless tobacco: Never  Substance Use Topics   Alcohol use: Yes    Allergies  Allergen Reactions   Amoxapine And Related Other (See Comments)    Burns skin, hair falls out   Amoxicillin Other (See Comments)    Burns skin and hair falls out   Oxycodone Itching   Percocet [Oxycodone-Acetaminophen] Itching   Rocephin [Ceftriaxone] Rash    swelling   Tramadol Itching    Current Outpatient Medications  Medication Sig Dispense Refill   acetaminophen (TYLENOL) 325 MG tablet Take 2 tablets (650 mg total) by mouth every 6 (six) hours as  needed for mild pain, fever or headache. 30 tablet 0   Continuous Blood Gluc Sensor (FREESTYLE LIBRE 14 DAY SENSOR) MISC 1 Units by Does not apply route every 14 (fourteen) days. 2 each 11   cyclobenzaprine (FLEXERIL) 10 MG tablet Take by mouth.     furosemide (LASIX) 20 MG tablet Take 1 tablet (20 mg total) by mouth daily. 14 tablet 0   HYDROcodone-acetaminophen (NORCO/VICODIN) 5-325 MG tablet Take 1 tablet by mouth every 6 (six) hours as needed for moderate pain. 20 tablet 0   Insulin Pen Needle 31G X 5 MM MISC Three times daily 90 each 0   metFORMIN (GLUCOPHAGE) 500 MG tablet Take 1 tablet (500 mg total) by mouth 2 (two) times daily with a meal. 180 tablet 1   mupirocin cream (BACTROBAN) 2 % Apply 1 application topically 2 (two) times daily. Apply to rash on legs 15 g 0   rivaroxaban (XARELTO) 20 MG TABS tablet Take 1 tablet (20 mg total) by mouth daily with supper. 30 tablet 2   TOUJEO SOLOSTAR 300 UNIT/ML Solostar Pen Inject 30 Units into the skin daily. 1.5 mL 3   No current facility-administered medications for this visit.    Review of Systems  Constitutional:  Constitutional negative. HENT: HENT negative.  Eyes: Eyes negative.  Cardiovascular: Positive for leg swelling.  GI: Gastrointestinal negative.  Musculoskeletal: Positive  for leg pain.  Skin: Skin negative.  Neurological: Neurological negative. Hematologic: Hematologic/lymphatic negative.  Psychiatric: Psychiatric negative.       Objective:  Objective   Vitals:   03/11/21 0834  BP: 116/81  Pulse: 84  Resp: 20  Temp: 97.8 F (36.6 C)  SpO2: 98%  Weight: 234 lb (106.1 kg)  Height: 5\' 5"  (1.651 m)   Body mass index is 38.94 kg/m.  Physical Exam HENT:     Head: Normocephalic.     Nose:     Comments: Wearing a mask Eyes:     Pupils: Pupils are equal, round, and reactive to light.  Cardiovascular:     Rate and Rhythm: Normal rate.  Pulmonary:     Effort: Pulmonary effort is normal.  Abdominal:      General: Abdomen is flat.     Palpations: Abdomen is soft.  Musculoskeletal:     Right lower leg: Edema present.     Left lower leg: No edema.     Comments: Right leg measures 43 cm left leg 40.5 cm measured 10 cm from the tibial tuberosities  Skin:    General: Skin is warm.     Capillary Refill: Capillary refill takes less than 2 seconds.  Neurological:     General: No focal deficit present.     Mental Status: She is alert.  Psychiatric:        Mood and Affect: Mood normal.        Behavior: Behavior normal.        Thought Content: Thought content normal.        Judgment: Judgment normal.    Data: IVC/Iliac Findings:  +----------+------+--------+--------+     IVC    PatentThrombusComments  +----------+------+--------+--------+  IVC Prox  patent                  +----------+------+--------+--------+  IVC Mid   patent                  +----------+------+--------+--------+  IVC Distalpatent                  +----------+------+--------+--------+      +-------------------+---------+-----------+---------+-----------+--------+          CIV        RT-PatentRT-ThrombusLT-PatentLT-ThrombusComments  +-------------------+---------+-----------+---------+-----------+--------+  Common Iliac Prox   patent                                           +-------------------+---------+-----------+---------+-----------+--------+  Common Iliac Mid    patent                                           +-------------------+---------+-----------+---------+-----------+--------+  Common Iliac Distal patent                                           +-------------------+---------+-----------+---------+-----------+--------+       +-------------------------+---------+-----------+---------+-----------+----  ----+             EIV            RT-PatentRT-ThrombusLT-PatentLT-ThrombusComments   +-------------------------+---------+-----------+---------+-----------+----  ----+  External Iliac Vein Prox  patent                                             +-------------------------+---------+-----------+---------+-----------+----  ----+  External Iliac Vein Mid   patent                                             +-------------------------+---------+-----------+---------+-----------+----  ----+  External Iliac Vein       patent                                             Distal                                                                       +-------------------------+---------+-----------+---------+-----------+----  ----+      Summary:  IVC/Iliac: No evidence of thrombus in IVC and Iliac veins. Patent right  CFV, PFV, and proximal FV.         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  I recommended continued compression socks, elevation, walking as tolerated.  We discussed the timing for compression when she gets up in the morning prior to ambulating and to take them off prior to sleep and anytime she can have her leg elevated.  She will continue Xarelto indefinitely at this point.  I discussed with the patient that she really has difficult area for treatment of venous occlusive disease possibly in the future we could consider stenting or vein transposition if her swelling does not improve.  Hopefully with time she will have some improvement.  I will plan to see her back in 6 months with right lower extremity venous duplex.  Patient demonstrates good understanding of our discussion today.     Maeola Harman MD [facility]

## 2021-03-12 ENCOUNTER — Other Ambulatory Visit: Payer: Self-pay | Admitting: Nurse Practitioner

## 2021-03-12 DIAGNOSIS — R600 Localized edema: Secondary | ICD-10-CM

## 2021-03-15 DIAGNOSIS — I825Y1 Chronic embolism and thrombosis of unspecified deep veins of right proximal lower extremity: Secondary | ICD-10-CM | POA: Diagnosis not present

## 2021-03-16 ENCOUNTER — Other Ambulatory Visit: Payer: Self-pay

## 2021-03-16 DIAGNOSIS — I82421 Acute embolism and thrombosis of right iliac vein: Secondary | ICD-10-CM

## 2021-04-12 ENCOUNTER — Telehealth: Payer: Self-pay | Admitting: Nurse Practitioner

## 2021-04-18 NOTE — Telephone Encounter (Signed)
LVM for patient to schedule appointment in regards to this medication.

## 2021-04-18 NOTE — Telephone Encounter (Signed)
Rx given to patient at ED on 03/01/21. Called and LVM for patient to return phone call.

## 2021-05-10 ENCOUNTER — Inpatient Hospital Stay (HOSPITAL_COMMUNITY)
Admission: EM | Admit: 2021-05-10 | Discharge: 2021-05-13 | DRG: 301 | Disposition: A | Discharge status: Home / Self Care | Payer: Self-pay | Attending: Internal Medicine | Admitting: Internal Medicine

## 2021-05-10 ENCOUNTER — Other Ambulatory Visit: Payer: Self-pay

## 2021-05-10 ENCOUNTER — Encounter (HOSPITAL_COMMUNITY): Payer: Self-pay | Admitting: Emergency Medicine

## 2021-05-10 ENCOUNTER — Emergency Department (HOSPITAL_COMMUNITY): Payer: Self-pay

## 2021-05-10 DIAGNOSIS — Z20822 Contact with and (suspected) exposure to covid-19: Secondary | ICD-10-CM | POA: Diagnosis present

## 2021-05-10 DIAGNOSIS — I82409 Acute embolism and thrombosis of unspecified deep veins of unspecified lower extremity: Secondary | ICD-10-CM | POA: Diagnosis present

## 2021-05-10 DIAGNOSIS — R760 Raised antibody titer: Secondary | ICD-10-CM | POA: Diagnosis present

## 2021-05-10 DIAGNOSIS — R609 Edema, unspecified: Secondary | ICD-10-CM

## 2021-05-10 DIAGNOSIS — I82511 Chronic embolism and thrombosis of right femoral vein: Secondary | ICD-10-CM | POA: Diagnosis present

## 2021-05-10 DIAGNOSIS — I82531 Chronic embolism and thrombosis of right popliteal vein: Secondary | ICD-10-CM | POA: Diagnosis present

## 2021-05-10 DIAGNOSIS — Z794 Long term (current) use of insulin: Secondary | ICD-10-CM

## 2021-05-10 DIAGNOSIS — I82451 Acute embolism and thrombosis of right peroneal vein: Principal | ICD-10-CM | POA: Diagnosis present

## 2021-05-10 DIAGNOSIS — I82441 Acute embolism and thrombosis of right tibial vein: Secondary | ICD-10-CM | POA: Diagnosis present

## 2021-05-10 DIAGNOSIS — Z87891 Personal history of nicotine dependence: Secondary | ICD-10-CM

## 2021-05-10 DIAGNOSIS — Z7901 Long term (current) use of anticoagulants: Secondary | ICD-10-CM

## 2021-05-10 DIAGNOSIS — Z803 Family history of malignant neoplasm of breast: Secondary | ICD-10-CM

## 2021-05-10 DIAGNOSIS — Z6838 Body mass index (BMI) 38.0-38.9, adult: Secondary | ICD-10-CM

## 2021-05-10 DIAGNOSIS — Z7984 Long term (current) use of oral hypoglycemic drugs: Secondary | ICD-10-CM

## 2021-05-10 DIAGNOSIS — M79604 Pain in right leg: Secondary | ICD-10-CM

## 2021-05-10 DIAGNOSIS — J45909 Unspecified asthma, uncomplicated: Secondary | ICD-10-CM | POA: Diagnosis present

## 2021-05-10 DIAGNOSIS — I82401 Acute embolism and thrombosis of unspecified deep veins of right lower extremity: Secondary | ICD-10-CM | POA: Diagnosis present

## 2021-05-10 DIAGNOSIS — Z79899 Other long term (current) drug therapy: Secondary | ICD-10-CM

## 2021-05-10 DIAGNOSIS — E669 Obesity, unspecified: Secondary | ICD-10-CM | POA: Diagnosis present

## 2021-05-10 DIAGNOSIS — Z86711 Personal history of pulmonary embolism: Secondary | ICD-10-CM

## 2021-05-10 DIAGNOSIS — E1151 Type 2 diabetes mellitus with diabetic peripheral angiopathy without gangrene: Secondary | ICD-10-CM | POA: Diagnosis present

## 2021-05-10 DIAGNOSIS — E1165 Type 2 diabetes mellitus with hyperglycemia: Secondary | ICD-10-CM | POA: Diagnosis present

## 2021-05-10 DIAGNOSIS — Z23 Encounter for immunization: Secondary | ICD-10-CM

## 2021-05-10 DIAGNOSIS — I824Y1 Acute embolism and thrombosis of unspecified deep veins of right proximal lower extremity: Secondary | ICD-10-CM

## 2021-05-10 LAB — CBC WITH DIFFERENTIAL/PLATELET
Abs Immature Granulocytes: 0.01 10*3/uL (ref 0.00–0.07)
Basophils Absolute: 0.1 10*3/uL (ref 0.0–0.1)
Basophils Relative: 1 %
Eosinophils Absolute: 0.3 10*3/uL (ref 0.0–0.5)
Eosinophils Relative: 4 %
HCT: 39.3 % (ref 36.0–46.0)
Hemoglobin: 12 g/dL (ref 12.0–15.0)
Immature Granulocytes: 0 %
Lymphocytes Relative: 37 %
Lymphs Abs: 2.4 10*3/uL (ref 0.7–4.0)
MCH: 22.9 pg — ABNORMAL LOW (ref 26.0–34.0)
MCHC: 30.5 g/dL (ref 30.0–36.0)
MCV: 75 fL — ABNORMAL LOW (ref 80.0–100.0)
Monocytes Absolute: 0.4 10*3/uL (ref 0.1–1.0)
Monocytes Relative: 6 %
Neutro Abs: 3.3 10*3/uL (ref 1.7–7.7)
Neutrophils Relative %: 52 %
Platelets: 297 10*3/uL (ref 150–400)
RBC: 5.24 MIL/uL — ABNORMAL HIGH (ref 3.87–5.11)
RDW: 13.5 % (ref 11.5–15.5)
WBC: 6.4 10*3/uL (ref 4.0–10.5)
nRBC: 0 % (ref 0.0–0.2)

## 2021-05-10 LAB — PROTIME-INR
INR: 1 (ref 0.8–1.2)
Prothrombin Time: 13.4 seconds (ref 11.4–15.2)

## 2021-05-10 LAB — RESP PANEL BY RT-PCR (FLU A&B, COVID) ARPGX2
Influenza A by PCR: NEGATIVE
Influenza B by PCR: NEGATIVE
SARS Coronavirus 2 by RT PCR: NEGATIVE

## 2021-05-10 LAB — BASIC METABOLIC PANEL
Anion gap: 6 (ref 5–15)
BUN: 9 mg/dL (ref 6–20)
CO2: 26 mmol/L (ref 22–32)
Calcium: 8.4 mg/dL — ABNORMAL LOW (ref 8.9–10.3)
Chloride: 104 mmol/L (ref 98–111)
Creatinine, Ser: 0.91 mg/dL (ref 0.44–1.00)
GFR, Estimated: >60 mL/min (ref >60)
Glucose, Bld: 293 mg/dL — ABNORMAL HIGH (ref 70–99)
Potassium: 4.3 mmol/L (ref 3.5–5.1)
Sodium: 136 mmol/L (ref 135–145)

## 2021-05-10 MED ORDER — INSULIN GLARGINE-YFGN 100 UNIT/ML ~~LOC~~ SOLN
30.0000 [IU] | Freq: Every day | SUBCUTANEOUS | Status: DC
  Administered 2021-05-11 – 2021-05-13 (×3): 30 [IU] via SUBCUTANEOUS
  Filled 2021-05-10 (×3): qty 0.3

## 2021-05-10 MED ORDER — INSULIN ASPART 100 UNIT/ML IJ SOLN
0.0000 [IU] | Freq: Three times a day (TID) | INTRAMUSCULAR | Status: DC
  Administered 2021-05-11 (×2): 3 [IU] via SUBCUTANEOUS
  Administered 2021-05-11 – 2021-05-12 (×2): 2 [IU] via SUBCUTANEOUS
  Administered 2021-05-12: 3 [IU] via SUBCUTANEOUS
  Administered 2021-05-13 (×2): 2 [IU] via SUBCUTANEOUS
  Filled 2021-05-10: qty 0.09

## 2021-05-10 MED ORDER — FENTANYL CITRATE PF 50 MCG/ML IJ SOSY
25.0000 ug | PREFILLED_SYRINGE | INTRAMUSCULAR | Status: DC | PRN
  Administered 2021-05-10 – 2021-05-12 (×8): 25 ug via INTRAVENOUS
  Filled 2021-05-10 (×8): qty 1

## 2021-05-10 MED ORDER — HEPARIN (PORCINE) 25000 UT/250ML-% IV SOLN
1600.0000 [IU]/h | INTRAVENOUS | Status: AC
  Administered 2021-05-10: 1500 [IU]/h via INTRAVENOUS
  Administered 2021-05-11: 1700 [IU]/h via INTRAVENOUS
  Administered 2021-05-12: 1600 [IU]/h via INTRAVENOUS
  Filled 2021-05-10 (×6): qty 250

## 2021-05-10 NOTE — ED Notes (Signed)
ED TO INPATIENT HANDOFF REPORT  Name/Age/Gender Shannon Oneal 40 y.o. female  Code Status    Code Status Orders  (From admission, onward)           Start     Ordered   05/10/21 2136  Full code  Continuous        05/10/21 2137           Code Status History     Date Active Date Inactive Code Status Order ID Comments User Context   02/04/2021 1828 02/06/2021 2002 Full Code [id number]  Alwyn Ren, MD ED   01/18/2021 2320 01/22/2021 1630 Full Code [id number]  Charlsie Quest, MD ED       Home/SNF/Other Home  Chief Complaint Acute DVT (deep venous thrombosis) (HCC) [I82.409]  Level of Care/Admitting Diagnosis ED Disposition     ED Disposition  Admit   Condition  --   Comment  Hospital Area: [facility] [[id number]]  Level of Care: Telemetry [5]  Admit to tele based on following criteria: Monitor for Ischemic changes  May admit patient to [facility] or [facility] if equivalent level of care is available:: Yes  Covid Evaluation: Asymptomatic Screening Protocol (No Symptoms)  Diagnosis: Acute DVT (deep venous thrombosis) [facility]) [353299]  Admitting Physician: Eduard Clos [phone]  Attending Physician: Eduard Clos [phone]  Estimated length of stay: past midnight tomorrow  Certification:: I certify this patient will need inpatient services for at least 2 midnights          Medical History Past Medical History:  Diagnosis Date   Acute deep vein thrombosis (DVT) of right lower extremity (HCC) 01/18/2021   Acute pulmonary embolism (HCC) 01/19/2021   Asthma    Cellulitis in diabetic foot (HCC)    Diabetes mellitus without complication (HCC)    DVT (deep venous thrombosis) (HCC) 02/05/2021   PE (pulmonary thromboembolism) (HCC) 2016    Allergies Allergies  Allergen Reactions   Amoxapine And Related Other (See Comments)    Burns skin, hair falls out   Amoxicillin Other (See Comments)    Burns skin and hair falls out    Oxycodone Itching   Percocet [Oxycodone-Acetaminophen] Itching   Rocephin [Ceftriaxone] Rash    swelling   Tramadol Itching    IV Location/Drains/Wounds Patient Lines/Drains/Airways Status     Active Line/Drains/Airways     Name Placement date Placement time Site Days   Peripheral IV 05/10/21 20 G Right Antecubital 05/10/21  2151  Antecubital  less than 1   Peripheral IV 05/10/21 22 G Left;Posterior Hand 05/10/21  2151  Hand  less than 1            Labs/Imaging Results for orders placed or performed during the hospital encounter of 05/10/21 (from the past 48 hour(s))  CBC with Differential     Status: Abnormal   Collection Time: 05/10/21  4:59 PM  Result Value Ref Range   WBC 6.4 4.0 - 10.5 K/uL   RBC 5.24 (H) 3.87 - 5.11 MIL/uL   Hemoglobin 12.0 12.0 - 15.0 g/dL   HCT 96.2 22.9 - 79.8 %   MCV 75.0 (L) 80.0 - 100.0 fL   MCH 22.9 (L) 26.0 - 34.0 pg   MCHC 30.5 30.0 - 36.0 g/dL   RDW 92.1 19.4 - 17.4 %   Platelets 297 150 - 400 K/uL   nRBC 0.0 0.0 - 0.2 %   Neutrophils Relative % 52 %   Neutro Abs 3.3 1.7 -  7.7 K/uL   Lymphocytes Relative 37 %   Lymphs Abs 2.4 0.7 - 4.0 K/uL   Monocytes Relative 6 %   Monocytes Absolute 0.4 0.1 - 1.0 K/uL   Eosinophils Relative 4 %   Eosinophils Absolute 0.3 0.0 - 0.5 K/uL   Basophils Relative 1 %   Basophils Absolute 0.1 0.0 - 0.1 K/uL   Immature Granulocytes 0 %   Abs Immature Granulocytes 0.01 0.00 - 0.07 K/uL    Comment: Performed at [facility], [address], [place], [place] [address]  Basic metabolic panel     Status: Abnormal   Collection Time: 05/10/21  4:59 PM  Result Value Ref Range   Sodium 136 135 - 145 mmol/L   Potassium 4.3 3.5 - 5.1 mmol/L   Chloride 104 98 - 111 mmol/L   CO2 26 22 - 32 mmol/L   Glucose, Bld 293 (H) 70 - 99 mg/dL    Comment: Glucose reference range applies only to samples taken after fasting for at least 8 hours.   BUN 9 6 - 20 mg/dL   Creatinine, Ser 2.50 0.44 - 1.00  mg/dL   Calcium 8.4 (L) 8.9 - 10.3 mg/dL   GFR, Estimated >53 >97 mL/min    Comment: (NOTE) Calculated using the CKD-EPI Creatinine Equation (2021)    Anion gap 6 5 - 15    Comment: Performed at [facility], [address], [place], [place] [address]   VAS [place] LOWER EXTREMITY VENOUS (DVT) (ONLY [facility] & [facility])  Result Date: 05/10/2021  Lower Venous DVT Study Patient Name:  Shannon Oneal  Date of Exam:   05/10/2021 Medical Rec #: [id number]    Accession #:    [id number] Date of Birth: 1981/02/26   Patient Gender: F Patient Age:   32 years Exam Location:  [facility] Procedure:      VAS [place] LOWER EXTREMITY VENOUS (DVT) Referring Phys: Honor Loh --------------------------------------------------------------------------------  Indications: Increased pain and edema to RLE ankle and calf for the past 4 days. Hx of extensive DVT and PE (12/2020).  Anticoagulation: Xarelto. Limitations: Poor ultrasound/tissue interface. Comparison       Previous exam 03/01/2021 - Chronic DVT RLE FV & PopV and SVT in Study:           SSV. Performing Technologist: Ernestene Mention RVT, RDMS  Examination Guidelines: A complete evaluation includes B-mode imaging, spectral Doppler, color Doppler, and power Doppler as needed of all accessible portions of each vessel. Bilateral testing is considered an integral part of a complete examination. Limited examinations for reoccurring indications may be performed as noted. The reflux portion of the exam is performed with the patient in reverse Trendelenburg.  +---------+---------------+---------+-----------+----------+--------------+ RIGHT    CompressibilityPhasicitySpontaneityPropertiesThrombus Aging +---------+---------------+---------+-----------+----------+--------------+ CFV      Full           Yes      Yes                                 +---------+---------------+---------+-----------+----------+--------------+ SFJ      Full                                                         +---------+---------------+---------+-----------+----------+--------------+ FV Prox  Full  Yes      Yes                                 +---------+---------------+---------+-----------+----------+--------------+ FV Mid   Partial        Yes      Yes                  Chronic        +---------+---------------+---------+-----------+----------+--------------+ FV DistalPartial        Yes      Yes                  Chronic        +---------+---------------+---------+-----------+----------+--------------+ PFV      Full                                                        +---------+---------------+---------+-----------+----------+--------------+ POP      None           No       No                   Chronic        +---------+---------------+---------+-----------+----------+--------------+ PTV      None           No       No                   Acute          +---------+---------------+---------+-----------+----------+--------------+ PERO     None           No       No                   Acute          +---------+---------------+---------+-----------+----------+--------------+ SSV      None           No       No                   Chronic        +---------+---------------+---------+-----------+----------+--------------+   +----+---------------+---------+-----------+----------+--------------+ LEFTCompressibilityPhasicitySpontaneityPropertiesThrombus Aging +----+---------------+---------+-----------+----------+--------------+ CFV Full           Yes      Yes                                 +----+---------------+---------+-----------+----------+--------------+    Summary: RIGHT: - Findings consistent with acute deep vein thrombosis involving the right peroneal veins, and right posterior tibial veins. 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 - Findings  consistent with chronic superficial vein thrombosis involving the right small saphenous vein. - Findings suggest new clot formation as compared to previous examination. - No cystic structure Oneal in the popliteal fossa.  LEFT: - No evidence of common femoral vein obstruction.  *See table(s) above for measurements and observations.    Preliminary     Pending Labs Unresulted Labs (From admission, onward)     Start     Ordered   05/11/21 0500  Basic metabolic panel  Tomorrow morning,   R        05/10/21 2137  05/11/21 0500  CBC  Tomorrow morning,   R        05/10/21 2137   05/11/21 0400  APTT  Once-Timed,   STAT        05/10/21 2106   05/11/21 0400  Heparin level (unfractionated)  Once-Timed,   STAT        05/10/21 2106   05/10/21 2041  Protime-INR  ONCE - STAT,   STAT        05/10/21 2040   05/10/21 2041  Resp Panel by RT-PCR (Flu A&B, Covid) Nasopharyngeal Swab  (Tier 2 - Symptomatic/asymptomatic)  Once,   STAT        05/10/21 2040            Vitals/Pain Today's Vitals   05/10/21 1613 05/10/21 1619 05/10/21 1855  BP: 126/89  128/90  Pulse: 95  87  Resp: 16  16  Temp: 98.6 F (37 C)  98.1 F (36.7 C)  TempSrc: Oral  Oral  SpO2: 100%  100%  Weight:  239 lb (108.4 kg)   Height:  5\' 6"  (1.676 m)   PainSc:  8      Isolation Precautions No active isolations  Medications Medications  heparin ADULT infusion 100 units/mL (25000 units/210mL) (1,500 Units/hr Intravenous New Bag/Given 05/10/21 2153)  insulin glargine-yfgn (SEMGLEE) injection 30 Units (has no administration in time range)  insulin aspart (novoLOG) injection 0-9 Units (has no administration in time range)    Mobility walks with device

## 2021-05-10 NOTE — ED Provider Notes (Signed)
Emergency Medicine Provider Triage Evaluation Note  Shannon Oneal , a 40 y.o. female  was evaluated in triage.  Pt complains of right leg pain and swelling over the last couple days.  She does have a history of DVTs and is on Xarelto.  She has not missed any doses.  She typically takes at night.  She denies any chest pain or shortness of breath.  Review of Systems  Positive:  Negative: See above  Physical Exam  BP 126/89 (BP Location: Left Arm)   Pulse 95   Temp 98.6 F (37 C) (Oral)   Resp 16   Ht 5\' 6"  (1.676 m)   Wt 108.4 kg   LMP 04/28/2021 (Exact Date)   SpO2 100%   BMI 38.58 kg/m  Gen:   Awake, no distress   Resp:  Normal effort  MSK:   Moves extremities without difficulty  Other:  Right lower leg is swollen and tender to palpation.  Medical Decision Making  Medically screening exam initiated at 4:51 PM.  Appropriate orders placed.  Meesha Sek was informed that the remainder of the evaluation will be completed by another provider, this initial triage assessment does not replace that evaluation, and the importance of remaining in the ED until their evaluation is complete.     [name] [place], PA-C 05/10/21 1652    05/12/21, MD 05/11/21 1246

## 2021-05-10 NOTE — H&P (Signed)
History and Physical    Shannon Oneal TDD:[id number] DOB: February 20, 1981 DOA: 05/10/2021  PCP: Anne Ng, NP  Patient coming from: Home.  Chief Complaint: Right lower extremity pain.    HPI: Shannon Oneal is a 40 y.o. female with known history of pulmonary embolism diagnosed in 2016 and recently diagnosed right lower extremity DVT in July 2022 presents to the ER with worsening pain in the right lower extremity and swelling over the last 1 week.  Patient states she has been compliant with Xarelto.  Denies any trauma.  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  Given the significant swelling of the leg ER physician discussed with Dr. Durwin Nora on-call vascular surgeon who advised heparin and at this time medication for thrombosis and admitted for further management and observation.  Labs show blood glucose of 293 COVID test negative.  Review of Systems: As per HPI, rest all negative.   Past Medical History:  Diagnosis Date   Acute deep vein thrombosis (DVT) of right lower extremity (HCC) 01/18/2021   Acute pulmonary embolism (HCC) 01/19/2021   Asthma    Cellulitis in diabetic foot (HCC)    Diabetes mellitus without complication (HCC)    DVT (deep venous thrombosis) (HCC) 02/05/2021   PE (pulmonary thromboembolism) (HCC) 2016    Past Surgical History:  Procedure Laterality Date   adenoids     CESAREAN SECTION       reports that she has never smoked. She has never used smokeless tobacco. She reports current alcohol use. She reports that she does not use drugs.  Allergies  Allergen Reactions   Amoxapine And Related Other (See Comments)    Burns skin, hair falls out   Amoxicillin Other (See Comments)    Burns skin and hair falls out   Oxycodone Itching   Percocet [Oxycodone-Acetaminophen] Itching   Rocephin [Ceftriaxone] Rash     swelling   Tramadol Itching    Family History  Problem Relation Age of Onset   Deep vein thrombosis Father     Prior to Admission medications   Medication Sig Start Date End Date Taking? Authorizing Provider  acetaminophen (TYLENOL) 325 MG tablet Take 2 tablets (650 mg total) by mouth every 6 (six) hours as needed for mild pain, fever or headache. 02/06/21  Yes Azucena Fallen, MD  insulin aspart (NOVOLOG FLEXPEN) 100 UNIT/ML FlexPen Inject 1-10 Units into the skin 3 (three) times daily with meals. Per sliding scale 02/15/21  Yes [provider]  metFORMIN (GLUCOPHAGE) 500 MG tablet Take 1 tablet (500 mg total) by mouth 2 (two) times daily with a meal. 02/15/21  Yes [name], Bonna Gains, NP  rivaroxaban (XARELTO) 20 MG TABS tablet Take 1 tablet (20 mg total) by mouth daily with supper. 02/24/21  Yes [name], Bonna Gains, NP  TOUJEO SOLOSTAR 300 UNIT/ML Solostar Pen Inject 30 Units into the skin daily. 02/15/21 05/16/21 Yes [name], Bonna Gains, NP  Continuous Blood Gluc Sensor (FREESTYLE LIBRE 14 DAY SENSOR) MISC 1 Units by Does not apply route every 14 (fourteen) days. 02/15/21   [name], Bonna Gains, NP  furosemide (LASIX) 20 MG tablet Take 1/2 (one-half) tablet by mouth once daily Patient not taking: Reported on 05/10/2021 03/16/21   [name], Bonna Gains, NP  HYDROcodone-acetaminophen (NORCO/VICODIN) 5-325 MG tablet Take 1 tablet by mouth every 6 (six) hours as needed  for moderate pain. Patient not taking: Reported on 05/10/2021 03/01/21   Bethann Berkshire, MD  Insulin Pen Needle 31G X 5 MM MISC Three times daily 01/23/21   Noralee Stain, DO  mupirocin cream (BACTROBAN) 2 % Apply 1 application topically 2 (two) times daily. Apply to rash on legs Patient not taking: Reported on 05/10/2021 01/30/21   Mancel Bale, MD  diphenhydrAMINE (BENADRYL) 25 mg capsule Take 1 capsule (25 mg total) by mouth every 6 (six) hours as needed for itching. Patient not taking: No sig reported 01/22/21 02/05/21   Noralee Stain, DO  enoxaparin (LOVENOX) 120 MG/0.8ML injection Inject 0.74 mLs (110 mg total) into the skin 2 (two) times daily. Patient not taking: No sig reported 01/22/21 02/05/21  Noralee Stain, DO  gabapentin (NEURONTIN) 300 MG capsule Take 1 capsule (300 mg total) by mouth 3 (three) times daily. Patient not taking: No sig reported 10/31/19 01/18/21  Pollyann Savoy, MD  metoCLOPramide (REGLAN) 10 MG tablet Take 1 tablet (10 mg total) by mouth every 6 (six) hours as needed (Nausea or headache). Patient not taking: Reported on 10/12/2017 02/18/13 10/31/19  Dione Booze, MD  promethazine (PHENERGAN) 25 MG tablet Take 1 tablet (25 mg total) by mouth every 6 (six) hours as needed for nausea or vomiting (headache). Patient not taking: No sig reported 06/04/20 01/18/21  Charlynne Pander, MD    Physical Exam: Constitutional: Moderately built and nourished. Vitals:   05/10/21 1613 05/10/21 1619 05/10/21 1855  BP: 126/89  128/90  Pulse: 95  87  Resp: 16  16  Temp: 98.6 F (37 C)  98.1 F (36.7 C)  TempSrc: Oral  Oral  SpO2: 100%  100%  Weight:  108.4 kg   Height:  5\' 6"  (1.676 m)    Eyes: Anicteric no pallor. ENMT: No discharge from the ears eyes nose and mouth. Neck: No mass felt.  No neck rigidity. Respiratory: No rhonchi or crepitations. Cardiovascular: S1-S2 heard. Abdomen: Soft nontender bowel sound present. Musculoskeletal: Swelling of the right lower extremity but has good pulses.  Good sensation. Skin: No rash. Neurologic: Alert awake oriented to time place and person.  Moves all extremities. Psychiatric: Appears normal.  Normal affect.   Labs on Admission: I have personally reviewed following labs and imaging studies  CBC: Recent Labs  Lab 05/10/21 1659  WBC 6.4  NEUTROABS 3.3  HGB 12.0  HCT 39.3  MCV 75.0*  PLT 297   Basic Metabolic Panel: Recent Labs  Lab 05/10/21 1659  NA 136  K 4.3  CL 104  CO2 26  GLUCOSE 293*  BUN 9  CREATININE 0.91  CALCIUM 8.4*    GFR: Estimated Creatinine Clearance: 101.3 mL/min (by C-G formula based on SCr of 0.91 mg/dL). Liver Function Tests: No results for input(s): AST, ALT, ALKPHOS, BILITOT, PROT, ALBUMIN in the last 168 hours. No results for input(s): LIPASE, AMYLASE in the last 168 hours. No results for input(s): AMMONIA in the last 168 hours. Coagulation Profile: No results for input(s): INR, PROTIME in the last 168 hours. Cardiac Enzymes: No results for input(s): CKTOTAL, CKMB, CKMBINDEX, TROPONINI in the last 168 hours. BNP (last 3 results) No results for input(s): PROBNP in the last 8760 hours. HbA1C: No results for input(s): HGBA1C in the last 72 hours. CBG: No results for input(s): GLUCAP in the last 168 hours. Lipid Profile: No results for input(s): CHOL, HDL, LDLCALC, TRIG, CHOLHDL, LDLDIRECT in the last 72 hours. Thyroid Function Tests: No results for input(s): TSH, T4TOTAL, FREET4,  T3FREE, THYROIDAB in the last 72 hours. Anemia Panel: No results for input(s): VITAMINB12, FOLATE, FERRITIN, TIBC, IRON, RETICCTPCT in the last 72 hours. Urine analysis:    Component Value Date/Time   COLORURINE YELLOW 06/02/2020 1255   APPEARANCEUR CLEAR 06/02/2020 1255   LABSPEC 1.018 06/02/2020 1255   PHURINE 5.0 06/02/2020 1255   GLUCOSEU NEGATIVE 06/02/2020 1255   HGBUR NEGATIVE 06/02/2020 1255   BILIRUBINUR NEGATIVE 06/02/2020 1255   KETONESUR 5 (A) 06/02/2020 1255   PROTEINUR NEGATIVE 06/02/2020 1255   NITRITE NEGATIVE 06/02/2020 1255   LEUKOCYTESUR NEGATIVE 06/02/2020 1255   Sepsis Labs: @LABRCNTIP (procalcitonin:4,lacticidven:4) )No results found for this or any previous visit (from the past 240 hour(s)).   Radiological Exams on Admission: VAS LOWER EXTREMITY VENOUS (DVT) (ONLY [facility] & [facility])  Result Date: 05/10/2021  Lower Venous DVT Study Patient Name:  Shannon Oneal  Date of Exam:   05/10/2021 Medical Rec #: 05/12/2021    Accession #:    [id number] Date of Birth: 1980/08/15   Patient Gender: F  Patient Age:   81 years Exam Location:  [facility] Procedure:      [facility] LOWER EXTREMITY VENOUS (DVT) Referring Phys: [place] --------------------------------------------------------------------------------  Indications: Increased pain and edema to RLE ankle and calf for the past 4 days. Hx of extensive DVT and PE (12/2020).  Anticoagulation: Xarelto. Limitations: Poor ultrasound/tissue interface. Comparison       Previous exam 03/01/2021 - Chronic DVT RLE FV & PopV and SVT in Study:           SSV. Performing Technologist: 05/01/2021 RVT, RDMS  Examination Guidelines: A complete evaluation includes B-mode imaging, spectral Doppler, color Doppler, and power Doppler as needed of all accessible portions of each vessel. Bilateral testing is considered an integral part of a complete examination. Limited examinations for reoccurring indications may be performed as noted. The reflux portion of the exam is performed with the patient in reverse Trendelenburg.  +---------+---------------+---------+-----------+----------+--------------+ RIGHT    CompressibilityPhasicitySpontaneityPropertiesThrombus Aging +---------+---------------+---------+-----------+----------+--------------+ CFV      Full           Yes      Yes                                 +---------+---------------+---------+-----------+----------+--------------+ SFJ      Full                                                        +---------+---------------+---------+-----------+----------+--------------+ FV Prox  Full           Yes      Yes                                 +---------+---------------+---------+-----------+----------+--------------+ FV Mid   Partial        Yes      Yes                  Chronic        +---------+---------------+---------+-----------+----------+--------------+ FV DistalPartial        Yes      Yes                  Chronic         +---------+---------------+---------+-----------+----------+--------------+  PFV      Full                                                        +---------+---------------+---------+-----------+----------+--------------+ POP      None           No       No                   Chronic        +---------+---------------+---------+-----------+----------+--------------+ PTV      None           No       No                   Acute          +---------+---------------+---------+-----------+----------+--------------+ PERO     None           No       No                   Acute          +---------+---------------+---------+-----------+----------+--------------+ SSV      None           No       No                   Chronic        +---------+---------------+---------+-----------+----------+--------------+   +----+---------------+---------+-----------+----------+--------------+ LEFTCompressibilityPhasicitySpontaneityPropertiesThrombus Aging +----+---------------+---------+-----------+----------+--------------+ CFV Full           Yes      Yes                                 +----+---------------+---------+-----------+----------+--------------+    Summary: RIGHT: - Findings consistent with acute deep vein thrombosis involving the right peroneal veins, and right posterior tibial veins. 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 - Findings consistent with chronic superficial vein thrombosis involving the right small saphenous vein. - Findings suggest new clot formation as compared to previous examination. - No cystic structure found in the popliteal fossa.  LEFT: - No evidence of common femoral vein obstruction.  *See table(s) above for measurements and observations.    Preliminary       Assessment/Plan Principal Problem:   Acute DVT (deep venous thrombosis) (HCC) Active Problems:   Type 2 diabetes mellitus  with hyperglycemia (HCC)   Deep vein thrombosis (DVT) of right lower extremity (HCC)    Acute DVT of the right lower extremity with prior history of chronic DVT of the right common femoral vein and right popliteal area seen in the Doppler study for which he of ER physician I discussed with on-call vascular surgeon Dr. Durwin Nora who advised keeping patient on heparin.  Patient states she has not missed a dose of Xarelto.  We will closely monitor after admission and to make sure patient did not have any phlegmasia.  Pain relief medications. Diabetes mellitus type 2 on Toujeo which patient takes in the morning.  We will give patient sliding scale coverage. Prior history of pulmonary embolism.  Significant swelling of the right lower extremity continue close monitoring for any further worsening edema need inpatient status.   Pregnancy screen is  pending.   DVT prophylaxis: Heparin. Code Status: Full code. Family Communication: Discussed with patient. Disposition Plan: Home. Consults called: ER physician discussed with vascular surgery. Admission status: Inpatient.   Eduard Clos MD [facility] Pager [phone].  If 7PM-7AM, please contact night-coverage [email or web address] Password [facility]  05/10/2021, 9:38 PM

## 2021-05-10 NOTE — Progress Notes (Signed)
ANTICOAGULATION CONSULT NOTE - Initial Consult  Pharmacy Consult for IV heparin (on Xarelto PTA) Indication:  expanding DVT already on Xarelto  Allergies  Allergen Reactions   Amoxapine And Related Other (See Comments)    Burns skin, hair falls out   Amoxicillin Other (See Comments)    Burns skin and hair falls out   Oxycodone Itching   Percocet [Oxycodone-Acetaminophen] Itching   Rocephin [Ceftriaxone] Rash    swelling   Tramadol Itching    Patient Measurements: Height: 5\' 6"  (167.6 cm) Weight: 108.4 kg (239 lb) IBW/kg (Calculated) : 59.3 Heparin Dosing Weight: 84  Vital Signs: Temp: 98.1 F (36.7 C) (10/18 1855) Temp Source: Oral (10/18 1855) BP: 128/90 (10/18 1855) Pulse Rate: 87 (10/18 1855)  Labs: Recent Labs    05/10/21 1659  HGB 12.0  HCT 39.3  PLT 297  CREATININE 0.91    Estimated Creatinine Clearance: 101.3 mL/min (by C-G formula based on SCr of 0.91 mg/dL).   Medical History: Past Medical History:  Diagnosis Date   Acute deep vein thrombosis (DVT) of right lower extremity (HCC) 01/18/2021   Acute pulmonary embolism (HCC) 01/19/2021   Asthma    Cellulitis in diabetic foot (HCC)    Diabetes mellitus without complication (HCC)    DVT (deep venous thrombosis) (HCC) 02/05/2021   PE (pulmonary thromboembolism) (HCC) 2016    Medications:  Scheduled:  Infusions:  PRN:   Assessment: 40 yo female taking Xarelto 20mg  once daily prior to admission presents to ER with complaints of right leg pain and swelling. Per Dopplers, DVT has expanded so per orders will switch Xarelto to IV heparin for time being while plan is being formulated. Baseline labs have been obtained. Per patient, she last took her home Xarelto dose 10/17 around 2200 so IV heparin will not be started until 2200 tonight when next Xarelto dose would have been due  Goal of Therapy:  Heparin level 0.3-0.7 units/ml Heparin level 66-102 units/ml Monitor platelets by anticoagulation protocol:  Yes   Plan:  No IV heparin bolus due to patient already being on Xarelto At 2200, start IV heparin at a rate of 1500 units/hr Check heparin level and aPTT 6 hours after start of IV heparin Daily CBC  05/10/2021,8:57 PM

## 2021-05-10 NOTE — ED Triage Notes (Addendum)
Pt states that she has a known DVT in her R leg and today it is more swollen and painful. Ankle is visibly swollen. Pt is taking her Xarelto as prescribed. Alert and oriented.

## 2021-05-10 NOTE — Progress Notes (Signed)
RLE venous duplex has been completed.  Preliminary findings messaged to Dr. Rhunette Croft & Loleta Dicker, PA via secure chat.   Results can be found under chart review under CV PROC. 05/10/2021 7:10 PM Mabeline Varas RVT, RDMS

## 2021-05-10 NOTE — ED Provider Notes (Signed)
[facility]-EMERGENCY DEPT Provider Note   CSN: [id number] Arrival date & time: 05/10/21  1603     History Chief Complaint  Patient presents with   Leg Pain    Shannon Oneal is a 40 y.o. female who presents with worsening right leg pain and swelling over the last 3 days. Patient was diagnosed with PE and right lower extremity DVT with right femoral and popliteal veins in June of this year.  Has been on Xarelto since that time.  She states she has not missed any of her Xarelto doses.  I personally reviewed this patient's medical records.  She has history of type 2 diabetes, DVTs on Xarelto.  HPI     Past Medical History:  Diagnosis Date   Acute deep vein thrombosis (DVT) of right lower extremity (HCC) 01/18/2021   Acute pulmonary embolism (HCC) 01/19/2021   Asthma    Cellulitis in diabetic foot (HCC)    Diabetes mellitus without complication (HCC)    DVT (deep venous thrombosis) (HCC) 02/05/2021   PE (pulmonary thromboembolism) (HCC) 2016    Patient Active Problem List   Diagnosis Date Noted   Acute DVT (deep venous thrombosis) (HCC) 05/10/2021   Bilateral pulmonary embolism (HCC) 02/04/2021   Deep vein thrombosis (DVT) of right lower extremity (HCC) 02/04/2021   Right leg pain 02/04/2021   Type 2 diabetes mellitus with hyperglycemia (HCC) 01/18/2021    Past Surgical History:  Procedure Laterality Date   adenoids     CESAREAN SECTION       OB History   No obstetric history on file.     Family History  Problem Relation Age of Onset   Deep vein thrombosis Father     Social History   Tobacco Use   Smoking status: Never   Smokeless tobacco: Never  Vaping Use   Vaping Use: Former   Substances: CBD  Substance Use Topics   Alcohol use: Yes   Drug use: Never    Home Medications Prior to Admission medications   Medication Sig Start Date End Date Taking? Authorizing Provider  acetaminophen (TYLENOL) 325 MG tablet Take 2 tablets (650 mg  total) by mouth every 6 (six) hours as needed for mild pain, fever or headache. 02/06/21  Yes Azucena Fallen, MD  insulin aspart (NOVOLOG FLEXPEN) 100 UNIT/ML FlexPen Inject 1-10 Units into the skin 3 (three) times daily with meals. Per sliding scale 02/15/21  Yes [provider]  metFORMIN (GLUCOPHAGE) 500 MG tablet Take 1 tablet (500 mg total) by mouth 2 (two) times daily with a meal. 02/15/21  Yes [name], Bonna Gains, NP  rivaroxaban (XARELTO) 20 MG TABS tablet Take 1 tablet (20 mg total) by mouth daily with supper. 02/24/21  Yes [name], Bonna Gains, NP  TOUJEO SOLOSTAR 300 UNIT/ML Solostar Pen Inject 30 Units into the skin daily. 02/15/21 05/16/21 Yes [name], Bonna Gains, NP  Continuous Blood Gluc Sensor (FREESTYLE LIBRE 14 DAY SENSOR) MISC 1 Units by Does not apply route every 14 (fourteen) days. 02/15/21   [name], Bonna Gains, NP  furosemide (LASIX) 20 MG tablet Take 1/2 (one-half) tablet by mouth once daily Patient not taking: Reported on 05/10/2021 03/16/21   [name], Bonna Gains, NP  HYDROcodone-acetaminophen (NORCO/VICODIN) 5-325 MG tablet Take 1 tablet by mouth every 6 (six) hours as needed for moderate pain. Patient not taking: Reported on 05/10/2021 03/01/21   Bethann Berkshire, MD  Insulin Pen Needle 31G X 5 MM MISC Three times daily 01/23/21   Noralee Stain,  DO  mupirocin cream (BACTROBAN) 2 % Apply 1 application topically 2 (two) times daily. Apply to rash on legs Patient not taking: Reported on 05/10/2021 01/30/21   Mancel Bale, MD  diphenhydrAMINE (BENADRYL) 25 mg capsule Take 1 capsule (25 mg total) by mouth every 6 (six) hours as needed for itching. Patient not taking: No sig reported 01/22/21 02/05/21  Noralee Stain, DO  enoxaparin (LOVENOX) 120 MG/0.8ML injection Inject 0.74 mLs (110 mg total) into the skin 2 (two) times daily. Patient not taking: No sig reported 01/22/21 02/05/21  Noralee Stain, DO  gabapentin (NEURONTIN) 300 MG capsule Take 1 capsule (300 mg total) by mouth 3  (three) times daily. Patient not taking: No sig reported 10/31/19 01/18/21  Pollyann Savoy, MD  metoCLOPramide (REGLAN) 10 MG tablet Take 1 tablet (10 mg total) by mouth every 6 (six) hours as needed (Nausea or headache). Patient not taking: Reported on 10/12/2017 02/18/13 10/31/19  Dione Booze, MD  promethazine (PHENERGAN) 25 MG tablet Take 1 tablet (25 mg total) by mouth every 6 (six) hours as needed for nausea or vomiting (headache). Patient not taking: No sig reported 06/04/20 01/18/21  Charlynne Pander, MD    Allergies    Amoxapine and related, Amoxicillin, Oxycodone, Percocet [oxycodone-acetaminophen], Rocephin [ceftriaxone], and Tramadol  Review of Systems   Review of Systems  Constitutional: Negative.   HENT: Negative.    Respiratory: Negative.    Cardiovascular:  Positive for leg swelling. Negative for chest pain and palpitations.  Gastrointestinal: Negative.   Genitourinary: Negative.   Musculoskeletal: Negative.   Skin: Negative.   Neurological: Negative.   Hematological: Negative.    Physical Exam Updated Vital Signs BP 120/80 (BP Location: Right Arm)   Pulse 80   Temp 98.1 F (36.7 C) (Oral)   Resp 16   Ht 5\' 6"  (1.676 m)   Wt 108.4 kg   LMP 04/28/2021 (Exact Date)   SpO2 99%   BMI 38.58 kg/m   Physical Exam Vitals and nursing note reviewed.  Constitutional:      Appearance: She is obese. She is not ill-appearing or toxic-appearing.  HENT:     Head: Normocephalic and atraumatic.     Nose: Nose normal. No congestion.     Mouth/Throat:     Mouth: Mucous membranes are moist.     Pharynx: Oropharynx is clear. Uvula midline. No oropharyngeal exudate or posterior oropharyngeal erythema.     Tonsils: No tonsillar exudate.  Eyes:     General: Lids are normal. Vision grossly intact.        Right eye: No discharge.        Left eye: No discharge.     Extraocular Movements: Extraocular movements intact.     Conjunctiva/sclera: Conjunctivae normal.     Pupils:  Pupils are equal, round, and reactive to light.  Neck:     Trachea: Trachea and phonation normal.  Cardiovascular:     Rate and Rhythm: Normal rate and regular rhythm.     Pulses:          Dorsalis pedis pulses are 1+ on the right side and 1+ on the left side.     Heart sounds: Normal heart sounds. No murmur heard. Pulmonary:     Effort: Pulmonary effort is normal. No tachypnea, bradypnea, accessory muscle usage, prolonged expiration or respiratory distress.     Breath sounds: Normal breath sounds. No wheezing or rales.  Chest:     Chest wall: No mass, lacerations, deformity, swelling, tenderness, crepitus  or edema.  Abdominal:     General: Bowel sounds are normal. There is no distension.     Palpations: Abdomen is soft.     Tenderness: There is no abdominal tenderness. There is no right CVA tenderness, left CVA tenderness, guarding or rebound.  Musculoskeletal:        General: Swelling and tenderness present. No deformity.     Cervical back: Normal range of motion and neck supple. No edema, rigidity or crepitus. No pain with movement, spinous process tenderness or muscular tenderness.     Right lower leg: No edema.     Left lower leg: No edema.       Legs:  Lymphadenopathy:     Cervical: No cervical adenopathy.  Skin:    General: Skin is warm and dry.     Capillary Refill: Capillary refill takes less than 2 seconds.  Neurological:     General: No focal deficit present.     Mental Status: She is alert and oriented to person, place, and time. Mental status is at baseline.     Gait: Gait is intact. Gait normal.  Psychiatric:        Mood and Affect: Mood normal.    ED Results / Procedures / Treatments   Labs (all labs ordered are listed, but only abnormal results are displayed) Labs Reviewed  CBC WITH DIFFERENTIAL/PLATELET - Abnormal; Notable for the following components:      Result Value   RBC 5.24 (*)    MCV 75.0 (*)    MCH 22.9 (*)    All other components within normal  limits  BASIC METABOLIC PANEL - Abnormal; Notable for the following components:   Glucose, Bld 293 (*)    Calcium 8.4 (*)    All other components within normal limits  RESP PANEL BY RT-PCR (FLU A&B, COVID) ARPGX2  PROTIME-INR  APTT  HEPARIN LEVEL (UNFRACTIONATED)  BASIC METABOLIC PANEL  CBC    EKG None  Radiology VAS [place] LOWER EXTREMITY VENOUS (DVT) (ONLY [facility])  Result Date: 05/10/2021  Lower Venous DVT Study Patient Name:  TYTIONNA CLOYD  Date of Exam:   05/10/2021 Medical Rec #: [id number]    Accession #:    [id number] Date of Birth: 06/25/1981   Patient Gender: F Patient Age:   51 years Exam Location:  [facility] Procedure:      VAS [place] LOWER EXTREMITY VENOUS (DVT) Referring Phys: Doylene Canard FLEMING --------------------------------------------------------------------------------  Indications: Increased pain and edema to RLE ankle and calf for the past 4 days. Hx of extensive DVT and PE (12/2020).  Anticoagulation: Xarelto. Limitations: Poor ultrasound/tissue interface. Comparison       Previous exam 03/01/2021 - Chronic DVT RLE FV & PopV and SVT in Study:           SSV. Performing Technologist: Ernestene Mention RVT, RDMS  Examination Guidelines: A complete evaluation includes B-mode imaging, spectral Doppler, color Doppler, and power Doppler as needed of all accessible portions of each vessel. Bilateral testing is considered an integral part of a complete examination. Limited examinations for reoccurring indications may be performed as noted. The reflux portion of the exam is performed with the patient in reverse Trendelenburg.  +---------+---------------+---------+-----------+----------+--------------+ RIGHT    CompressibilityPhasicitySpontaneityPropertiesThrombus Aging +---------+---------------+---------+-----------+----------+--------------+ CFV      Full           Yes      Yes                                  +---------+---------------+---------+-----------+----------+--------------+  SFJ      Full                                                        +---------+---------------+---------+-----------+----------+--------------+ FV Prox  Full           Yes      Yes                                 +---------+---------------+---------+-----------+----------+--------------+ FV Mid   Partial        Yes      Yes                  Chronic        +---------+---------------+---------+-----------+----------+--------------+ FV DistalPartial        Yes      Yes                  Chronic        +---------+---------------+---------+-----------+----------+--------------+ PFV      Full                                                        +---------+---------------+---------+-----------+----------+--------------+ POP      None           No       No                   Chronic        +---------+---------------+---------+-----------+----------+--------------+ PTV      None           No       No                   Acute          +---------+---------------+---------+-----------+----------+--------------+ PERO     None           No       No                   Acute          +---------+---------------+---------+-----------+----------+--------------+ SSV      None           No       No                   Chronic        +---------+---------------+---------+-----------+----------+--------------+   +----+---------------+---------+-----------+----------+--------------+ LEFTCompressibilityPhasicitySpontaneityPropertiesThrombus Aging +----+---------------+---------+-----------+----------+--------------+ CFV Full           Yes      Yes                                 +----+---------------+---------+-----------+----------+--------------+    Summary: RIGHT: - Findings consistent with acute deep vein thrombosis involving the right peroneal veins, and right posterior tibial veins. 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 - Findings consistent with chronic superficial vein thrombosis involving the right small saphenous vein. - Findings suggest new clot formation as compared to  previous examination. - No cystic structure found in the popliteal fossa.  LEFT: - No evidence of common femoral vein obstruction.  *See table(s) above for measurements and observations.    Preliminary     Procedures Procedures   Medications Ordered in ED Medications  heparin ADULT infusion 100 units/mL (25000 units/263mL) (1,500 Units/hr Intravenous New Bag/Given 05/10/21 2153)  insulin glargine-yfgn (SEMGLEE) injection 30 Units (has no administration in time range)  insulin aspart (novoLOG) injection 0-9 Units (has no administration in time range)    ED Course  I have reviewed the triage vital signs and the nursing notes.  Pertinent labs & imaging results that were available during my care of the patient were reviewed by me and considered in my medical decision making (see chart for details).  Clinical Course as of 05/10/21 2236  Tue May 10, 2021  2107 Consult to Dr. Toniann Fail who is requesting vascular surgery consultation.  He is agreeable to seeing this patient admitting her to his service.  He is requesting clarification by vascular surgery if she may go to [facility] or should stay here.  I appreciate his collaboration care of this patient. [RS]  2115 Consult to Dr. Durwin Nora, vascular surgery, who does not feel patient is candidate for thrombolytics. Recommends lovenox, does not need transfer to [facility].  [RS]    Clinical Course User Index [RS] Rashauna Tep, Idelia Salm   MDM Rules/Calculators/A&P                         40 year old female presents with concern for worsening right lower extremity pain.  History of DVT on Xarelto.  Differential diagnosis includes is not limited to DVT, superficial venous thrombosis,  cellulitis, arterial insufficiency, venous insufficiency.  Vital signs are normal intake.  Cardiopulmonary exam is normal, abdominal exam is benign.  Lower extremity exam did reveal swelling, warmth to touch, tenderness palpation with 1+ palpable pedal pulses.   CBC unremarkable, BMP unremarkable.  Venous ultrasound ordered from triage did reveal acute DVT of the right peroneal veins as well as the right posterior tibial veins.  Chronic DVT visualized of the right femoral and right popliteal veins. PERC negative; do not think CTA is warranted.   Given recent diagnosis of PEs and now progression of her clotting on DOAC, feel patient would benefit from admission to the hospital for further management.  Will heparinize at this time.  Consult to vascular surgery as above per hospitalist request.  Patient with candidate for invasive procedure. She has been admitted to the hospitalist.  Lamar Laundry voiced understanding of her medical evaluation and treatment.  Each of her questions was answered to her expressed satisfaction.  She is amenable plan for admission at this time.   This chart was dictated using voice recognition software, Dragon. Despite the best efforts of this provider to proofread and correct errors, errors may still occur which can change documentation meaning.  Final Clinical Impression(s) / ED Diagnoses Final diagnoses:  None    Rx / DC Orders ED Discharge Orders     None        Sherrilee Gilles 05/10/21 2236    Derwood Kaplan, MD 05/17/21 1035

## 2021-05-11 DIAGNOSIS — I2699 Other pulmonary embolism without acute cor pulmonale: Secondary | ICD-10-CM

## 2021-05-11 DIAGNOSIS — I82451 Acute embolism and thrombosis of right peroneal vein: Principal | ICD-10-CM

## 2021-05-11 DIAGNOSIS — I82401 Acute embolism and thrombosis of unspecified deep veins of right lower extremity: Secondary | ICD-10-CM

## 2021-05-11 DIAGNOSIS — Z7901 Long term (current) use of anticoagulants: Secondary | ICD-10-CM

## 2021-05-11 DIAGNOSIS — I82432 Acute embolism and thrombosis of left popliteal vein: Secondary | ICD-10-CM

## 2021-05-11 DIAGNOSIS — I82412 Acute embolism and thrombosis of left femoral vein: Secondary | ICD-10-CM

## 2021-05-11 LAB — GLUCOSE, CAPILLARY
Glucose-Capillary: 213 mg/dL — ABNORMAL HIGH (ref 70–99)
Glucose-Capillary: 237 mg/dL — ABNORMAL HIGH (ref 70–99)
Glucose-Capillary: 164 mg/dL — ABNORMAL HIGH (ref 70–99)
Glucose-Capillary: 204 mg/dL — ABNORMAL HIGH (ref 70–99)

## 2021-05-11 LAB — CBC
HCT: 35.9 % — ABNORMAL LOW (ref 36.0–46.0)
Hemoglobin: 11.1 g/dL — ABNORMAL LOW (ref 12.0–15.0)
MCH: 23 pg — ABNORMAL LOW (ref 26.0–34.0)
MCHC: 30.9 g/dL (ref 30.0–36.0)
MCV: 74.3 fL — ABNORMAL LOW (ref 80.0–100.0)
Platelets: 279 10*3/uL (ref 150–400)
RBC: 4.83 MIL/uL (ref 3.87–5.11)
RDW: 13.6 % (ref 11.5–15.5)
WBC: 7.9 10*3/uL (ref 4.0–10.5)
nRBC: 0 % (ref 0.0–0.2)

## 2021-05-11 LAB — BASIC METABOLIC PANEL
Anion gap: 6 (ref 5–15)
BUN: 10 mg/dL (ref 6–20)
CO2: 23 mmol/L (ref 22–32)
Calcium: 8.4 mg/dL — ABNORMAL LOW (ref 8.9–10.3)
Chloride: 108 mmol/L (ref 98–111)
Creatinine, Ser: 0.73 mg/dL (ref 0.44–1.00)
GFR, Estimated: >60 mL/min (ref >60)
Glucose, Bld: 228 mg/dL — ABNORMAL HIGH (ref 70–99)
Potassium: 3.9 mmol/L (ref 3.5–5.1)
Sodium: 137 mmol/L (ref 135–145)

## 2021-05-11 LAB — HEPARIN LEVEL (UNFRACTIONATED)
Heparin Unfractionated: 0.29 IU/mL — ABNORMAL LOW (ref 0.30–0.70)
Heparin Unfractionated: 0.51 IU/mL (ref 0.30–0.70)
Heparin Unfractionated: 0.77 IU/mL — ABNORMAL HIGH (ref 0.30–0.70)

## 2021-05-11 LAB — PREGNANCY, URINE: Preg Test, Ur: NEGATIVE

## 2021-05-11 LAB — APTT
aPTT: 43 seconds — ABNORMAL HIGH (ref 24–36)
aPTT: 61 seconds — ABNORMAL HIGH (ref 24–36)

## 2021-05-11 MED ORDER — INFLUENZA VAC SPLIT QUAD 0.5 ML IM SUSY
0.5000 mL | PREFILLED_SYRINGE | INTRAMUSCULAR | Status: AC
  Administered 2021-05-12: 0.5 mL via INTRAMUSCULAR
  Filled 2021-05-11: qty 0.5

## 2021-05-11 NOTE — Progress Notes (Addendum)
ANTICOAGULATION CONSULT NOTE - follow up  Pharmacy Consult for IV heparin (on Xarelto PTA) Indication:  expanding DVT already on Xarelto  Allergies  Allergen Reactions   Amoxapine And Related Other (See Comments)    Burns skin, hair falls out   Amoxicillin Other (See Comments)    Burns skin and hair falls out   Oxycodone Itching   Percocet [Oxycodone-Acetaminophen] Itching   Rocephin [Ceftriaxone] Rash    swelling   Tramadol Itching    Patient Measurements: Height: 5\' 6"  (167.6 cm) Weight: 108.4 kg (239 lb) IBW/kg (Calculated) : 59.3 Heparin Dosing Weight: 84  Vital Signs: Temp: 98 F (36.7 C) (10/19 0305) Temp Source: Oral (10/18 1855) BP: 115/84 (10/19 0305) Pulse Rate: 84 (10/19 0305)  Labs: Recent Labs    05/10/21 1659 05/10/21 2140 05/11/21 0417  HGB 12.0  --  11.1*  HCT 39.3  --  35.9*  PLT 297  --  279  APTT  --   --  43*  LABPROT  --  13.4  --   INR  --  1.0  --   HEPARINUNFRC  --   --  0.29*  CREATININE 0.91  --  0.73     Estimated Creatinine Clearance: 115.3 mL/min (by C-G formula based on SCr of 0.73 mg/dL).   Medical History: Past Medical History:  Diagnosis Date   Acute deep vein thrombosis (DVT) of right lower extremity (HCC) 01/18/2021   Acute pulmonary embolism (HCC) 01/19/2021   Asthma    Cellulitis in diabetic foot (HCC)    Diabetes mellitus without complication (HCC)    DVT (deep venous thrombosis) (HCC) 02/05/2021   PE (pulmonary thromboembolism) (HCC) 2016    Medications:  Scheduled:  Infusions:  PRN:   Assessment: 40 yo female taking Xarelto 20mg  once daily prior to admission presents to ER with complaints of right leg pain and swelling. Per Dopplers, DVT has expanded so per orders will switch Xarelto to IV heparin for time being while plan is being formulated. Baseline labs have been obtained. Per patient, she last took her home Xarelto dose 10/17 around 2200 so IV heparin will not be started until 2200 on  10/18  05/11/2021 aPTT 43 subtherapeutic on  HL 0.29, if this is accurate question compliance Hgb 11.1, Plts 279 Per RN no bleeding or interruptions  Goal of Therapy:  Heparin level 0.3-0.7 units/ml Heparin level 66-102 units/ml Monitor platelets by anticoagulation protocol: Yes   Plan:  Increase heparin drip to 1700 units/hr Aptt and heparin level in 6 hours Daily CBC  11/18 RPh 05/11/2021, 5:26 AM

## 2021-05-11 NOTE — Progress Notes (Signed)
ANTICOAGULATION CONSULT NOTE - Follow Up Consult  Pharmacy Consult for Heparin Indication: DVT  Allergies  Allergen Reactions   Amoxapine And Related Other (See Comments)    Burns skin, hair falls out   Amoxicillin Other (See Comments)    Burns skin and hair falls out   Oxycodone Itching   Percocet [Oxycodone-Acetaminophen] Itching   Rocephin [Ceftriaxone] Rash    swelling   Tramadol Itching    Patient Measurements: Height: 5\' 6"  (167.6 cm) Weight: 108.4 kg (239 lb) IBW/kg (Calculated) : 59.3 Heparin Dosing Weight: 84 kg  Vital Signs: Temp: 98.2 F (36.8 C) (10/19 0658) BP: 130/87 (10/19 0658) Pulse Rate: 86 (10/19 0658)  Labs: Recent Labs    05/10/21 1659 05/10/21 2140 05/11/21 0417  HGB 12.0  --  11.1*  HCT 39.3  --  35.9*  PLT 297  --  279  APTT  --   --  43*  LABPROT  --  13.4  --   INR  --  1.0  --   HEPARINUNFRC  --   --  0.29*  CREATININE 0.91  --  0.73    Estimated Creatinine Clearance: 115.3 mL/min (by C-G formula based on SCr of 0.73 mg/dL).   Medications:  Scheduled:   [START ON 05/12/2021] influenza vac split quadrivalent PF  0.5 mL Intramuscular Tomorrow-1000   insulin aspart  0-9 Units Subcutaneous TID WC   insulin glargine-yfgn  30 Units Subcutaneous Daily   Infusions:   heparin 1,700 Units/hr (05/11/21 05/13/21)    Assessment: 40 yoF admitted on 10/18 with RLE swelling, pain, and expanding DVT despite PTA Xarelto.  Pharmacy is consulted to dose Heparin IV.  Home Xarelto 20mg  daily.  Per patient, she last took her home dose 10/17 around 2200. - She states her last Xarelto refill was in September at [facility]. - Epic records show last refill on 02/24/21; [facility] reports her last refill on 03/23/21.   - Heparin level was 0.29 while on heparin infusion (would usually expect supra-therapeutic levels of > 1.1 if Xarelto was taken in the past few days)  Today, 05/11/2021: Heparin level 0.51, increased to  therapeutic range. - Has not shown the  APTT 61, sub-therapeutic on heparin 1700 units/hr CBC:  Hgb 11.1, Plt wnl No bleeding or complications reported   Goal of Therapy:  Heparin level 0.3-0.7 units/ml aPTT 66-102 seconds Monitor platelets by anticoagulation protocol: Yes   Plan:  Continue heparin IV infusion at 1700 units/hr - Will dose based on heparin level going forward given the increase to therapeutic range correlating with increase in aptt. Heparin level in 6 hours to confirm therapeutic rate.   Daily heparin level and CBC Follow up vascular surgery, heme/onc, long-term anticoagulation plans.   03/25/21 PharmD, BCPS Clinical Pharmacist [facility] main pharmacy [phone] 05/11/2021 12:35 PM

## 2021-05-11 NOTE — Progress Notes (Signed)
PROGRESS NOTE  Shannon Oneal  KGM:[id number] DOB: [birth year] DOA: 05/10/2021 PCP: Anne Ng, NP   Brief Narrative: Shannon Oneal is a 40 y.o. female with a history of PE/DVTs who presented to the ED 10/18 with abruptly worsening right leg swelling, warmth and pain for the previous 5 days.   She was diagnosed with PE in the setting of prolonged driving initially in 6440 treated with 6 months of eliquis, then pradaxa (changed based on insurance formulary). No DVT was found per pt report. In June 2022 she sought care for right leg swelling and chest pressure found to have bilateral central PEs and right femoral, popliteal, PT and gastrocnemius DVTs for which she was treated with lovenox initially, then transitioned to xarelto, again based on insurance coverage. She had increased swelling a couple weeks after discharge, though repeat U/S showed stability of previous findings and no more proximal DVTs. With continued significant symptoms, she's been on xarelto with good adherence and using compression stocking intermittently (limited by foot/ankle swelling). Vascular surgery has been following her, but have continued to advise against any interventions.   On the evening of 10/13, swelling and pain worsened, associated with more warmth and skin tightness. 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 Due to treatment failure with xarelto, she was placed on heparin gtt, admitted with hematology and vascular surgery consults pending.   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 This is peculiar as her hypercoagulability panel was relatively unremarkable (mild elevation in beta-2-glycoprotein IgA) and doesn't have strongly positive FH (father is on coumadin for DVT diagnosed later in life). Her level of obesity may be a factor, though I find it hard to blame for new clot formation despite DOAC.  - Continue heparin gtt pending hematology input. Anticipate she may need coumadin. CM involvement is appreciated. We can provide MATCH letter. Would need to investigate PCP's ability to manage coumadin if that is decided. - Consider early colorectal and breast CA screening. Enlarged LNs in groin on U/S. Older sister had mastectomy for breast CA recently. Paternal grandfather and uncle had some cancers diagnosed late in life.  - No symptoms attributable to PE.   IDT2DM: HbA1c 11.6%.  - Continue basal insulin and SSI  Obesity: Estimated body mass index is 38.58 kg/m as calculated from the following:   Height as of this encounter: 5\' 6"  (1.676 m).   Weight as of this encounter: 108.4 kg.  DVT prophylaxis: Heparin gtt Code Status: Full Family Communication: None at bedside Disposition Plan:  Status is: Inpatient  Remains inpatient appropriate because: IV heparin, consultations  Consultants:  Vascular surgery Hematology, Dr.  Procedures:  None  Antimicrobials: None   Subjective: Pain in right leg is severe, stable, slightly improved from admission and swelling has seemed to go down since then as well. No dyspnea or chest pains, no bleeding currently, though does have heavier periods than usual. These are and have been irregular.   Objective: Vitals:   05/10/21 2251 05/11/21 0305 05/11/21 0658 05/11/21 1242  BP: 129/87  115/84 130/87 (!) 131/92  Pulse:  84 86 89  Resp:  17 20 15   Temp:  98 F (36.7 C) 98.2 F (36.8 C) 98 F (36.7 C)  TempSrc:    Oral  SpO2: 100% 100% 100% 100%  Weight:       Height:        Intake/Output Summary (Last 24 hours) at 05/11/2021 1401 Last data filed at 05/11/2021 0700 Gross per 24 hour  Intake 136.91 ml  Output 800 ml  Net -663.09 ml   Filed Weights   05/10/21 1619  Weight: 108.4 kg   Gen: Pleasant young female in no distress Pulm: Non-labored breathing room air. Clear to auscultation bilaterally.  CV: Regular rate and rhythm. No murmur, rub, or gallop. No JVD, RLE edema. GI: Abdomen soft, non-tender, non-distended, with normoactive bowel sounds. No organomegaly or masses felt. Ext: RLE is diffusely edematous with minimal pitting, warmer than contralateral comparison, tender throughout the lower leg worst in calf and ankle/foot. DP pulse 2+, no phlegmasia cutaneous changes. Thigh is nontender. Skin: No rashes, lesions or ulcers Neuro: Alert and oriented. No focal neurological deficits. Psych: Judgement and insight appear normal. Mood & affect appropriate.   Data Reviewed: I have personally reviewed following labs and imaging studies  CBC: Recent Labs  Lab 05/10/21 1659 05/11/21 0417  WBC 6.4 7.9  NEUTROABS 3.3  --   HGB 12.0 11.1*  HCT 39.3 35.9*  MCV 75.0* 74.3*  PLT 297 279   Basic Metabolic Panel: Recent Labs  Lab 05/10/21 1659 05/11/21 0417  NA 136 137  K 4.3 3.9  CL 104 108  CO2 26 23  GLUCOSE 293* 228*  BUN 9 10  CREATININE 0.91 0.73  CALCIUM 8.4* 8.4*   GFR: Estimated Creatinine Clearance: 115.3 mL/min (by C-G formula based on SCr of 0.73 mg/dL). Liver Function Tests: No results for input(s): AST, ALT, ALKPHOS, BILITOT, PROT, ALBUMIN in the last 168 hours. No results for input(s): LIPASE, AMYLASE in the last 168 hours. No results for input(s): AMMONIA in the last 168 hours. Coagulation Profile: Recent Labs  Lab 05/10/21 2140  INR 1.0   Cardiac Enzymes: No results for input(s): CKTOTAL, CKMB, CKMBINDEX, TROPONINI in the last 168 hours. BNP (last 3 results) No results for input(s): PROBNP in the last  8760 hours. HbA1C: No results for input(s): HGBA1C in the last 72 hours. CBG: Recent Labs  Lab 05/11/21 0748 05/11/21 1145  GLUCAP 213* 237*   Lipid Profile: No results for input(s): CHOL, HDL, LDLCALC, TRIG, CHOLHDL, LDLDIRECT in the last 72 hours. Thyroid Function Tests: No results for input(s): TSH, T4TOTAL, FREET4, T3FREE, THYROIDAB in the last 72 hours. Anemia Panel: No results for input(s): VITAMINB12, FOLATE, FERRITIN, TIBC, IRON, RETICCTPCT in the last 72 hours. Urine analysis:    Component Value Date/Time   COLORURINE YELLOW 06/02/2020 1255   APPEARANCEUR CLEAR 06/02/2020 1255   LABSPEC 1.018 06/02/2020 1255   PHURINE 5.0 06/02/2020 1255   GLUCOSEU NEGATIVE 06/02/2020 1255   HGBUR NEGATIVE 06/02/2020 1255   BILIRUBINUR NEGATIVE 06/02/2020 1255   KETONESUR 5 (A) 06/02/2020 1255   PROTEINUR NEGATIVE 06/02/2020 1255   NITRITE NEGATIVE 06/02/2020 1255   LEUKOCYTESUR NEGATIVE 06/02/2020 1255   Recent Results (from the past 240 hour(s))  Resp Panel by RT-PCR (Flu A&B, Covid) Nasopharyngeal Swab     Status: None   Collection Time: 05/10/21  9:40 PM   Specimen: Nasopharyngeal Swab; Nasopharyngeal(NP) swabs in vial transport medium  Result Value Ref Range Status  SARS Coronavirus 2 by RT PCR NEGATIVE NEGATIVE Final    Comment: (NOTE) SARS-CoV-2 target nucleic acids are NOT DETECTED.  The SARS-CoV-2 RNA is generally detectable in upper respiratory specimens during the acute phase of infection. The lowest concentration of SARS-CoV-2 viral copies this assay can detect is 138 copies/mL. A negative result does not preclude SARS-Cov-2 infection and should not be used as the sole basis for treatment or other patient management decisions. A negative result may occur with  improper specimen collection/handling, submission of specimen other than nasopharyngeal swab, presence of viral mutation(s) within the areas targeted by this assay, and inadequate number of  viral copies(<138 copies/mL). A negative result must be combined with clinical observations, patient history, and epidemiological information. The expected result is Negative.  Fact Sheet for Patients:  BloggerCourse.com  Fact Sheet for Healthcare Providers:  SeriousBroker.it  This test is no t yet approved or cleared by the [place] FDA and  has been authorized for detection and/or diagnosis of SARS-CoV-2 by FDA under an Emergency Use Authorization (EUA). This EUA will remain  in effect (meaning this test can be used) for the duration of the COVID-19 declaration under Section 564(b)(1) of the Act, 21 U.S.C.section 360bbb-3(b)(1), unless the authorization is terminated  or revoked sooner.       Influenza A by PCR NEGATIVE NEGATIVE Final   Influenza B by PCR NEGATIVE NEGATIVE Final    Comment: (NOTE) The Xpert Xpress SARS-CoV-2/FLU/RSV plus assay is intended as an aid in the diagnosis of influenza from Nasopharyngeal swab specimens and should not be used as a sole basis for treatment. Nasal washings and aspirates are unacceptable for Xpert Xpress SARS-CoV-2/FLU/RSV testing.  Fact Sheet for Patients: BloggerCourse.com  Fact Sheet for Healthcare Providers: SeriousBroker.it  This test is not yet approved or cleared by the [place] FDA and has been authorized for detection and/or diagnosis of SARS-CoV-2 by FDA under an Emergency Use Authorization (EUA). This EUA will remain in effect (meaning this test can be used) for the duration of the COVID-19 declaration under Section 564(b)(1) of the Act, 21 U.S.C. section 360bbb-3(b)(1), unless the authorization is terminated or revoked.  Performed at [facility], [address], [place], [place] [address]       Radiology Studies: VAS [place] LOWER EXTREMITY VENOUS (DVT) (ONLY [facility] & [facility])  Result Date:  05/11/2021  Lower Venous DVT Study Patient Name:  Shannon Oneal  Date of Exam:   05/10/2021 Medical Rec #: [id number]    Accession #:    [id number] Date of Birth: 05-30-1981   Patient Gender: F Patient Age:   48 years Exam Location:  [facility] Procedure:      VAS [place] LOWER EXTREMITY VENOUS (DVT) Referring Phys: Honor Loh --------------------------------------------------------------------------------  Indications: Increased pain and edema to RLE ankle and calf for the past 4 days. Hx of extensive DVT and PE (12/2020).  Anticoagulation: Xarelto. Limitations: Poor ultrasound/tissue interface. Comparison       Previous exam 03/01/2021 - Chronic DVT RLE FV & PopV and SVT in Study:           SSV. Performing Technologist: Ernestene Mention RVT, RDMS  Examination Guidelines: A complete evaluation includes B-mode imaging, spectral Doppler, color Doppler, and power Doppler as needed of all accessible portions of each vessel. Bilateral testing is considered an integral part of a complete examination. Limited examinations for reoccurring indications may be performed as noted. The reflux portion of the exam is performed with the patient in reverse  Trendelenburg.  +---------+---------------+---------+-----------+----------+--------------+ RIGHT    CompressibilityPhasicitySpontaneityPropertiesThrombus Aging +---------+---------------+---------+-----------+----------+--------------+ CFV      Full           Yes      Yes                                 +---------+---------------+---------+-----------+----------+--------------+ SFJ      Full                                                        +---------+---------------+---------+-----------+----------+--------------+ FV Prox  Full           Yes      Yes                                 +---------+---------------+---------+-----------+----------+--------------+ FV Mid   Partial        Yes      Yes                  Chronic         +---------+---------------+---------+-----------+----------+--------------+ FV DistalPartial        Yes      Yes                  Chronic        +---------+---------------+---------+-----------+----------+--------------+ PFV      Full                                                        +---------+---------------+---------+-----------+----------+--------------+ POP      None           No       No                   Chronic        +---------+---------------+---------+-----------+----------+--------------+ PTV      None           No       No                   Acute          +---------+---------------+---------+-----------+----------+--------------+ PERO     None           No       No                   Acute          +---------+---------------+---------+-----------+----------+--------------+ SSV      None           No       No                   Chronic        +---------+---------------+---------+-----------+----------+--------------+   +----+---------------+---------+-----------+----------+--------------+ LEFTCompressibilityPhasicitySpontaneityPropertiesThrombus Aging +----+---------------+---------+-----------+----------+--------------+ CFV Full           Yes      Yes                                 +----+---------------+---------+-----------+----------+--------------+  Summary: RIGHT: - Findings consistent with acute deep vein thrombosis involving the right peroneal veins, and right posterior tibial veins. 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 - Findings consistent with chronic superficial vein thrombosis involving the right small saphenous vein. - Findings suggest new clot formation as compared to previous examination. - No cystic structure found in the popliteal fossa.  LEFT: - No evidence of common femoral vein obstruction.  *See table(s) above for measurements and observations.  Electronically signed by Waverly Ferrari MD on 05/11/2021 at 7:13:08 AM.    Final     Scheduled Meds:  [START ON 05/12/2021] influenza vac split quadrivalent PF  0.5 mL Intramuscular Tomorrow-1000   insulin aspart  0-9 Units Subcutaneous TID WC   insulin glargine-yfgn  30 Units Subcutaneous Daily   Continuous Infusions:  heparin 1,700 Units/hr (05/11/21 1244)     LOS: 1 day   Time spent: 35 minutes.  Tyrone Nine, MD [facility] [email or web address] 05/11/2021, 2:01 PM

## 2021-05-11 NOTE — TOC Initial Note (Addendum)
Transition of Care [facility]) - Initial/Assessment Note    Patient Details  Name: Shannon Oneal MRN: [id number] Date of Birth: 1981-03-04  Transition of [facility]) CM/SW Contact:    Lanier Clam, RN Phone Number: 05/11/2021, 2:08 PM  Clinical Narrative: Spoke to patient about d/c plans-Return home. Recent job loss-No health insurance,applied to [employer] for Health insurance-starts Nov 1,2022. Has pcp,pharmacy. Informed of MATCH program for meds-qualifies, has $3 co pay for each med-informed of requirements-1x use/within 14yr/$3 co pay/select pharmacies.Will provide MATCH letter if needed.                 Expected Discharge Plan: Home/Self Care Barriers to Discharge: Continued Medical Work up   Patient Goals and CMS Choice Patient states their goals for this hospitalization and ongoing recovery are:: home CMS Medicare.gov Compare Post Acute Care list provided to:: Patient    Expected Discharge Plan and Services Expected Discharge Plan: Home/Self Care   Discharge Planning Services: CM Consult   Living arrangements for the past 2 months: Single Family Home                                      Prior Living Arrangements/Services Living arrangements for the past 2 months: Single Family Home Lives with:: Spouse Patient language and need for interpreter reviewed:: Yes Do you feel safe going back to the place where you live?: Yes      Need for Family Participation in Patient Care: Yes (Comment) Care giver support system in place?: Yes (comment)   Criminal Activity/Legal Involvement Pertinent to Current Situation/Hospitalization: No - Comment as needed  Activities of Daily Living Home Assistive Devices/Equipment: None ADL Screening (condition at time of admission) Patient's cognitive ability adequate to safely complete daily activities?: Yes Is the patient deaf or have difficulty hearing?: No Does the patient have difficulty seeing, even when wearing glasses/contacts?:  No Does the patient have difficulty concentrating, remembering, or making decisions?: No Patient able to express need for assistance with ADLs?: Yes Does the patient have difficulty dressing or bathing?: No Independently performs ADLs?: Yes (appropriate for developmental age) Does the patient have difficulty walking or climbing stairs?: Yes Weakness of Legs: Right Weakness of Arms/Hands: None  Permission Sought/Granted Permission sought to share information with : Case Manager Permission granted to share information with : Yes, Verbal Permission Granted  Share Information with NAME: Case Manager           Emotional Assessment Appearance:: Appears stated age Attitude/Demeanor/Rapport: Gracious Affect (typically observed): Accepting Orientation: : Oriented to Self, Oriented to Place, Oriented to  Time, Oriented to Situation Alcohol / Substance Use: Not Applicable Psych Involvement: No (comment)  Admission diagnosis:  Acute DVT (deep venous thrombosis) (HCC) [I82.409] Patient Active Problem List   Diagnosis Date Noted   Acute DVT (deep venous thrombosis) (HCC) 05/10/2021   Bilateral pulmonary embolism (HCC) 02/04/2021   Deep vein thrombosis (DVT) of right lower extremity (HCC) 02/04/2021   Right leg pain 02/04/2021   Type 2 diabetes mellitus with hyperglycemia (HCC) 01/18/2021   PCP:  Anne Ng, NP Pharmacy:   [facility] [address] [place] - [address] [place] [address] Phone: [phone] Fax: [phone]     Social Determinants of Health (SDOH) Interventions    Readmission Risk Interventions No flowsheet data found.

## 2021-05-11 NOTE — Consult Note (Addendum)
Hospital Consult    Reason for Consult: New DVT despite anticoagulation Requesting Physician: Dr. Hazeline Junker MRN #:  [id number]  History of Present Illness: This is a 40 y.o. female who has been seen by our office previously with right lower extremity DVT.  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  She was wearing compression stockings elevating and walking as tolerated, it taking Xarelto as anticoagulation.  At that point, the plan was to continue taking Xarelto indefinitely.  She presented to the hospital yesterday with worsening right leg swelling and pain over the last 3 days.  Patient was noted to have propagation of her deep venous thrombosis in the right peroneal and posterior tibial veins.  On exam, so he was doing well.  She continued to have pain in the right calf.  She denied sensorimotor changes.  Past Medical History:  Diagnosis Date   Acute deep vein thrombosis (DVT) of right lower extremity (HCC) 01/18/2021   Acute pulmonary embolism (HCC) 01/19/2021   Asthma    Cellulitis in diabetic foot (HCC)    Diabetes mellitus without complication (HCC)    DVT (deep venous thrombosis) (HCC) 02/05/2021   PE (pulmonary thromboembolism) (HCC) 2016    Past Surgical History:  Procedure Laterality Date   adenoids     CESAREAN SECTION      Allergies  Allergen Reactions   Amoxapine And Related Other (See Comments)    Burns skin, hair falls out   Amoxicillin Other (See Comments)    Burns skin and hair falls out   Oxycodone Itching   Percocet [Oxycodone-Acetaminophen] Itching   Rocephin [Ceftriaxone] Rash    swelling   Tramadol Itching    Prior to Admission medications   Medication Sig Start Date End Date Taking? Authorizing Provider  acetaminophen (TYLENOL) 325 MG tablet Take 2 tablets (650 mg total) by mouth every 6 (six) hours as needed for mild pain, fever or headache.  02/06/21  Yes Azucena Fallen, MD  insulin aspart (NOVOLOG FLEXPEN) 100 UNIT/ML FlexPen Inject 1-10 Units into the skin 3 (three) times daily with meals. Per sliding scale 02/15/21  Yes [provider]  metFORMIN (GLUCOPHAGE) 500 MG tablet Take 1 tablet (500 mg total) by mouth 2 (two) times daily with a meal. 02/15/21  Yes [name], Bonna Gains, NP  rivaroxaban (XARELTO) 20 MG TABS tablet Take 1 tablet (20 mg total) by mouth daily with supper. 02/24/21  Yes [name], Bonna Gains, NP  TOUJEO SOLOSTAR 300 UNIT/ML Solostar Pen Inject 30 Units into the skin daily. 02/15/21 05/16/21 Yes [name], Bonna Gains, NP  Continuous Blood Gluc Sensor (FREESTYLE LIBRE 14 DAY SENSOR) MISC 1 Units by Does not apply route every 14 (fourteen) days. 02/15/21   [name], Bonna Gains, NP  furosemide (LASIX) 20 MG tablet Take 1/2 (one-half) tablet by mouth once daily Patient not taking: Reported on 05/10/2021 03/16/21   [name], Bonna Gains, NP  HYDROcodone-acetaminophen (NORCO/VICODIN) 5-325 MG tablet Take 1 tablet by mouth every 6 (six) hours as needed for moderate pain. Patient not taking: Reported on 05/10/2021 03/01/21   Bethann Berkshire, MD  Insulin Pen Needle 31G X 5 MM MISC Three times daily 01/23/21   Noralee Stain, DO  mupirocin cream (BACTROBAN) 2 % Apply 1 application topically 2 (two) times daily. Apply to rash on legs Patient not taking: Reported on 05/10/2021 01/30/21   Mancel Bale, MD  diphenhydrAMINE (BENADRYL) 25  mg capsule Take 1 capsule (25 mg total) by mouth every 6 (six) hours as needed for itching. Patient not taking: No sig reported 01/22/21 02/05/21  Noralee Stain, DO  enoxaparin (LOVENOX) 120 MG/0.8ML injection Inject 0.74 mLs (110 mg total) into the skin 2 (two) times daily. Patient not taking: No sig reported 01/22/21 02/05/21  Noralee Stain, DO  gabapentin (NEURONTIN) 300 MG capsule Take 1 capsule (300 mg total) by mouth 3 (three) times daily. Patient not taking: No sig reported 10/31/19 01/18/21   Pollyann Savoy, MD  metoCLOPramide (REGLAN) 10 MG tablet Take 1 tablet (10 mg total) by mouth every 6 (six) hours as needed (Nausea or headache). Patient not taking: Reported on 10/12/2017 02/18/13 10/31/19  Dione Booze, MD  promethazine (PHENERGAN) 25 MG tablet Take 1 tablet (25 mg total) by mouth every 6 (six) hours as needed for nausea or vomiting (headache). Patient not taking: No sig reported 06/04/20 01/18/21  Charlynne Pander, MD    Social History   Socioeconomic History   Marital status: Married    Spouse name: Not on file   Number of children: Not on file   Years of education: Not on file   Highest education level: Not on file  Occupational History   Not on file  Tobacco Use   Smoking status: Never   Smokeless tobacco: Never  Vaping Use   Vaping Use: Former   Substances: CBD  Substance and Sexual Activity   Alcohol use: Yes   Drug use: Never   Sexual activity: Not on file  Other Topics Concern   Not on file  Social History Narrative   Not on file   Social Determinants of Health   Financial Resource Strain: Not on file  Food Insecurity: Not on file  Transportation Needs: Not on file  Physical Activity: Not on file  Stress: Not on file  Social Connections: Not on file  Intimate Partner Violence: Not on file     Family History  Problem Relation Age of Onset   Deep vein thrombosis Father     ROS: Otherwise negative unless mentioned in HPI  Physical Examination  Vitals:   05/11/21 1242 05/11/21 2109  BP: (!) 131/92 126/87  Pulse: 89 81  Resp: 15 18  Temp: 98 F (36.7 C) 97.8 F (36.6 C)  SpO2: 100% 99%   Body mass index is 38.58 kg/m.  General:  WDWN in NAD Gait: Not observed HENT: WNL, normocephalic Pulmonary: normal non-labored breathing, without Rales, rhonchi,  wheezing Cardiac: regular rate and rhythm Abdomen:  soft, NT/ND, no masses Skin: without rashes Vascular Exam/Pulses:  2+ dorsalis pedic bilaterally Extremities: without  ischemic changes, without Gangrene , without cellulitis; without open wounds;  Right thigh soft, calf edematous, no color changes Musculoskeletal: no muscle wasting or atrophy  Neurologic: A&O X 3;  No focal weakness or paresthesias are detected; speech is fluent/normal Psychiatric:  The pt has Normal affect. Lymph:  Unremarkable  CBC    Component Value Date/Time   WBC 7.9 05/11/2021 0417   RBC 4.83 05/11/2021 0417   HGB 11.1 (L) 05/11/2021 0417   HCT 35.9 (L) 05/11/2021 0417   PLT 279 05/11/2021 0417   MCV 74.3 (L) 05/11/2021 0417   MCH 23.0 (L) 05/11/2021 0417   MCHC 30.9 05/11/2021 0417   RDW 13.6 05/11/2021 0417   LYMPHSABS 2.4 05/10/2021 1659   MONOABS 0.4 05/10/2021 1659   EOSABS 0.3 05/10/2021 1659   BASOSABS 0.1 05/10/2021 1659    BMET  Component Value Date/Time   NA 137 05/11/2021 0417   K 3.9 05/11/2021 0417   CL 108 05/11/2021 0417   CO2 23 05/11/2021 0417   GLUCOSE 228 (H) 05/11/2021 0417   BUN 10 05/11/2021 0417   CREATININE 0.73 05/11/2021 0417   CALCIUM 8.4 (L) 05/11/2021 0417   GFRNONAA >60 05/11/2021 0417   GFRAA >60 10/31/2019 1713    COAGS: Lab Results  Component Value Date   INR 1.0 05/10/2021   INR 1.1 01/18/2021     Non-Invasive Vascular Imaging:   Noninvasive venous ultrasonography was independently reviewed demonstrating propagation of thrombus into the right peroneal and posterior tibial veins  Statin:  No. Beta Blocker:  No. Aspirin:  No. ACEI:  No. ARB:  No. CCB use:  No Other antiplatelets/anticoagulants:  Yes.   heparin   ASSESSMENT/PLAN: This is a 40 y.o. female with propagation of right lower extremity DVT, and new PE.  Previous ultrasound demonstrated occlusive distal femoral and nonocclusive popliteal vein thrombus that has now propagated into the posterior tibial and peroneal veins.  Unfortunately there is not an endovascular open therapy that has been proven to provide benefit for distal femoral and popliteal vein deep  venous thrombosis.  - Recommend ASA 81mg  daily.  - Anticoagulation per hematology -I will set up patient for 1 month follow-up with our office   MD [facility] [phone] 05/11/2021  9:39 PM

## 2021-05-11 NOTE — Consult Note (Signed)
Reason for the request:    Recurrent thromboembolism.  HPI: I was asked by Dr. Jarvis Newcomer to evaluate Ms. Younce for the evaluation of recurrent venous thromboembolism.  She is a 40 year old woman without any significant past medical history presented with unprovoked deep vein thrombosis and pulmonary embolism and 2016.  At that time she was treated with Lovenox and subsequently treated with Pradaxa for 6 months.  She did not have any issues till she presented in June 2022 with complaints of right lower extremity edema and pain.  She was diagnosed at that time with right lower extremity deep vein thrombosis and bilateral pulmonary emboli.  Hypercoagulable panel at that time, showed mild elevation in anticardiolipin antibody of IgA as well as beta-2 glycoprotein up to 40.  She was evaluated by Dr. Candise Che and recommended treatment with Lovenox but for coverage purposes he was not able to receive it.  She remained on Xarelto.  She presented acutely on May 10, 2021 with increased lower extremity pain on the right side and swelling.  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  She was started on intravenous heparin at that time.  She has been evaluated by vascular surgery although no intervention has been recommended.  Clinically, she reports her pain is improved with pain medication only although the swelling still persists despite being on intravenous heparin.  She reported 2 previous pregnancies without any complications.  She denies any specific family history of venous thromboembolism.   She does not report any headaches, blurry vision, syncope or seizures. Does not report any fevers, chills or sweats.  Does not report any cough, wheezing or hemoptysis.  Does not report any chest pain, palpitation, orthopnea or leg edema.  Does not report any nausea, vomiting or abdominal pain.  Does not report  any constipation or diarrhea.  Does not report any skeletal complaints.    Does not report frequency, urgency or hematuria.  Does not report any skin rashes or lesions. Does not report any heat or cold intolerance.  Does not report any lymphadenopathy or petechiae.  Does not report any anxiety or depression.  Remaining review of systems is negative.     Past Medical History:  Diagnosis Date   Acute deep vein thrombosis (DVT) of right lower extremity (HCC) 01/18/2021   Acute pulmonary embolism (HCC) 01/19/2021   Asthma    Cellulitis in diabetic foot (HCC)    Diabetes mellitus without complication (HCC)    DVT (deep venous thrombosis) (HCC) 02/05/2021   PE (pulmonary thromboembolism) (HCC) 2016  :   Past Surgical History:  Procedure Laterality Date   adenoids     CESAREAN SECTION    :   Current Facility-Administered Medications:    fentaNYL (SUBLIMAZE) injection 25 mcg, 25 mcg, Intravenous, Q2H PRN, Eduard Clos, MD, 25 mcg at 05/11/21 1201   heparin ADULT infusion 100 units/mL (25000 units/2105mL), 1,700 Units/hr, Intravenous, Continuous, Maurice March, RPH, Last Rate: 17 mL/hr at 05/11/21 1244, 1,700 Units/hr at 05/11/21 1244   [START ON 05/12/2021] influenza vac split quadrivalent PF (FLUARIX) injection 0.5 mL, 0.5 mL, Intramuscular, Tomorrow-1000, Grunz, Ryan B, MD   insulin aspart (novoLOG) injection 0-9 Units, 0-9 Units, Subcutaneous, TID WC, Eduard Clos, MD, 3 Units at 05/11/21 1201   insulin glargine-yfgn (SEMGLEE) injection 30 Units, 30 Units, Subcutaneous, Daily, Eduard Clos, MD, 30 Units at 05/11/21 0831:  Allergies  Allergen Reactions   Amoxapine And Related Other (See Comments)    Burns skin, hair falls out   Amoxicillin Other (See Comments)    Burns skin and hair falls out   Oxycodone Itching   Percocet [Oxycodone-Acetaminophen] Itching   Rocephin [Ceftriaxone] Rash    swelling   Tramadol Itching  :   Family History  Problem  Relation Age of Onset   Deep vein thrombosis Father   :   Social History   Socioeconomic History   Marital status: Married    Spouse name: Not on file   Number of children: Not on file   Years of education: Not on file   Highest education level: Not on file  Occupational History   Not on file  Tobacco Use   Smoking status: Never   Smokeless tobacco: Never  Vaping Use   Vaping Use: Former   Substances: CBD  Substance and Sexual Activity   Alcohol use: Yes   Drug use: Never   Sexual activity: Not on file  Other Topics Concern   Not on file  Social History Narrative   Not on file   Social Determinants of Health   Financial Resource Strain: Not on file  Food Insecurity: Not on file  Transportation Needs: Not on file  Physical Activity: Not on file  Stress: Not on file  Social Connections: Not on file  Intimate Partner Violence: Not on file  :  Pertinent items are noted in HPI.  Exam: Blood pressure (!) 131/92, pulse 89, temperature 98 F (36.7 C), temperature source Oral, resp. rate 15, height 5\' 6"  (1.676 m), weight 239 lb (108.4 kg), last menstrual period 04/28/2021, SpO2 100 %. ECOG 0 General appearance: alert and cooperative appeared without distress. Head: atraumatic without any abnormalities. Eyes: conjunctivae/corneas clear. PERRL.  Sclera anicteric. Throat: lips, mucosa, and tongue normal; without oral thrush or ulcers. Resp: clear to auscultation bilaterally without rhonchi, wheezes or dullness to percussion. Cardio: regular rate and rhythm, S1, S2 normal.  Right lower extremity swelling up to the knee. GI: soft, non-tender; bowel sounds normal; no masses,  no organomegaly Skin: Skin color, texture, turgor normal. No rashes or lesions Lymph nodes: Cervical, supraclavicular, and axillary nodes normal. Neurologic: Grossly normal without any motor, sensory or deep tendon reflexes.    Recent Labs    05/10/21 1659 05/11/21 0417  WBC 6.4 7.9  HGB 12.0  11.1*  HCT 39.3 35.9*  PLT 297 279    Recent Labs    05/10/21 1659 05/11/21 0417  NA 136 137  K 4.3 3.9  CL 104 108  CO2 26 23  GLUCOSE 293* 228*  BUN 9 10  CREATININE 0.91 0.73  CALCIUM 8.4* 8.4*       VAS 05/13/21 LOWER EXTREMITY VENOUS (DVT) (ONLY [facility] & [facility])  Result Date: 05/11/2021  Lower Venous DVT Study Patient Name:  MALLI FALOTICO  Date of Exam:   05/10/2021 Medical Rec #: 05/12/2021    Accession #:    [id number] Date of Birth: Jan 20, 1981   Patient Gender: F Patient Age:   21 years Exam Location:  [facility] Procedure:      [facility] LOWER EXTREMITY VENOUS (DVT) Referring Phys: [place] FLEMING --------------------------------------------------------------------------------  Indications: Increased pain and edema to RLE ankle and calf for the past 4 days. Hx of extensive DVT and PE (12/2020).  Anticoagulation: Xarelto. Limitations: Poor ultrasound/tissue interface. Comparison       Previous exam 03/01/2021 - Chronic DVT RLE FV &  PopV and SVT in Study:           SSV. Performing Technologist: Ernestene Mention RVT, RDMS  Examination Guidelines: A complete evaluation includes B-mode imaging, spectral Doppler, color Doppler, and power Doppler as needed of all accessible portions of each vessel. Bilateral testing is considered an integral part of a complete examination. Limited examinations for reoccurring indications may be performed as noted. The reflux portion of the exam is performed with the patient in reverse Trendelenburg.  +---------+---------------+---------+-----------+----------+--------------+ RIGHT    CompressibilityPhasicitySpontaneityPropertiesThrombus Aging +---------+---------------+---------+-----------+----------+--------------+ CFV      Full           Yes      Yes                                 +---------+---------------+---------+-----------+----------+--------------+ SFJ      Full                                                         +---------+---------------+---------+-----------+----------+--------------+ FV Prox  Full           Yes      Yes                                 +---------+---------------+---------+-----------+----------+--------------+ FV Mid   Partial        Yes      Yes                  Chronic        +---------+---------------+---------+-----------+----------+--------------+ FV DistalPartial        Yes      Yes                  Chronic        +---------+---------------+---------+-----------+----------+--------------+ PFV      Full                                                        +---------+---------------+---------+-----------+----------+--------------+ POP      None           No       No                   Chronic        +---------+---------------+---------+-----------+----------+--------------+ PTV      None           No       No                   Acute          +---------+---------------+---------+-----------+----------+--------------+ PERO     None           No       No                   Acute          +---------+---------------+---------+-----------+----------+--------------+ SSV      None           No       No  Chronic        +---------+---------------+---------+-----------+----------+--------------+   +----+---------------+---------+-----------+----------+--------------+ LEFTCompressibilityPhasicitySpontaneityPropertiesThrombus Aging +----+---------------+---------+-----------+----------+--------------+ CFV Full           Yes      Yes                                 +----+---------------+---------+-----------+----------+--------------+     Summary: RIGHT: - Findings consistent with acute deep vein thrombosis involving the right peroneal veins, and right posterior tibial veins. 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 - Findings consistent with chronic  superficial vein thrombosis involving the right small saphenous vein. - Findings suggest new clot formation as compared to previous examination. - No cystic structure found in the popliteal fossa.  LEFT: - No evidence of common femoral vein obstruction.  *See table(s) above for measurements and observations. Electronically signed by Waverly Ferrari MD on 05/11/2021 at 7:13:08 AM.    Final     Assessment and Plan:    40 year old woman with:  1.  Recurrent venous thromboembolism dating back to 2016.  She has 2 unprovoked episodes at this time.  Initially treated with Pradaxa for 6 months and in June 2022 she was started on Xarelto.  She presented in October 2022 with recurrent acute deep vein thrombosis of the right lower extremity despite being compliant with Xarelto.  The natural course of inherited and acquired thrombophilia as well as management choices were reviewed at this time.  The patient has also been evaluated by Dr. Candise Che from a hematology standpoint.  She does have a hypercoagulable panel completed in June 2022 showed mild elevation of beta-2 glycoprotein and anticardiolipin antibody although I am not quite sure how clinically significant these findings are.  Given her presumed Xarelto failure treatment options at this time would include long-term Lovenox therapy versus switching to warfarin.  If Lovenox cannot be given to her than switching to warfarin may be her best option at this time.  In terms of hospital management at this time, I recommend keeping her on intravenous heparin for 48 hours before attempting to transition to a different anticoagulant with her was Lovenox for below.  2.  Follow-up: Hematology follow-up can be arranged as an outpatient with Dr. Candise Che who has seen her in the past.  Please call with any questions in the interim.     80  minutes were dedicated to this visit. The time was spent on reviewing laboratory data, imaging studies, discussing treatment  options, discussing differential diagnosis and answering questions regarding future plan.

## 2021-05-11 NOTE — Progress Notes (Addendum)
ANTICOAGULATION CONSULT NOTE - Follow Up Consult  Pharmacy Consult for Heparin Indication: DVT  Allergies  Allergen Reactions   Amoxapine And Related Other (See Comments)    Burns skin, hair falls out   Amoxicillin Other (See Comments)    Burns skin and hair falls out   Oxycodone Itching   Percocet [Oxycodone-Acetaminophen] Itching   Rocephin [Ceftriaxone] Rash    swelling   Tramadol Itching    Patient Measurements: Height: 5\' 6"  (167.6 cm) Weight: 108.4 kg (239 lb) IBW/kg (Calculated) : 59.3 Heparin Dosing Weight: 84 kg  Vital Signs: Temp: 98 F (36.7 C) (10/19 1242) Temp Source: Oral (10/19 1242) BP: 131/92 (10/19 1242) Pulse Rate: 89 (10/19 1242)  Labs: Recent Labs    05/10/21 1659 05/10/21 2140 05/11/21 0417 05/11/21 1356 05/11/21 1954  HGB 12.0  --  11.1*  --   --   HCT 39.3  --  35.9*  --   --   PLT 297  --  279  --   --   APTT  --   --  43* 61*  --   LABPROT  --  13.4  --   --   --   INR  --  1.0  --   --   --   HEPARINUNFRC  --   --  0.29* 0.51 0.77*  CREATININE 0.91  --  0.73  --   --      Estimated Creatinine Clearance: 115.3 mL/min (by C-G formula based on SCr of 0.73 mg/dL).   Medications:  Scheduled:   [START ON 05/12/2021] influenza vac split quadrivalent PF  0.5 mL Intramuscular Tomorrow-1000   insulin aspart  0-9 Units Subcutaneous TID WC   insulin glargine-yfgn  30 Units Subcutaneous Daily   Infusions:   heparin 1,700 Units/hr (05/11/21 1244)    Assessment: 40 yoF admitted on 10/18 with RLE swelling, pain, and expanding DVT despite PTA Xarelto.  Pharmacy is consulted to dose Heparin IV.  Home Xarelto 20mg  daily.  Per patient, she last took her home dose 10/17 around 2200. - She states her last Xarelto refill was in September at [facility]. - Epic records show last refill on 02/24/21; [facility] reports her last refill on 03/23/21.   - Heparin level was 0.29 while on heparin infusion (would usually  expect supra-therapeutic levels of > 1.1 if Xarelto was taken in the past few days)  Today, 05/11/2021 PM: Heparin level supratherapeutic at 0.77 -Discussed with RN: heparin is running on L hand and HL was drawn from R hand (drawn appropriately), and no bleeding or complications with line -CBC stable  Goal of Therapy:  Heparin level 0.3-0.7 units/ml aPTT 66-102 seconds Monitor platelets by anticoagulation protocol: Yes   Plan:  Reduce heparin drip to 1600 units/hr Heparin level in 6 hours to confirm therapeutic rate.   Monitor daily CBC Follow up vascular surgery, heme/onc, long-term anticoagulation plans.  03/25/21, PharmD 05/11/2021 8:45 PM

## 2021-05-12 LAB — GLUCOSE, CAPILLARY
Glucose-Capillary: 178 mg/dL — ABNORMAL HIGH (ref 70–99)
Glucose-Capillary: 200 mg/dL — ABNORMAL HIGH (ref 70–99)
Glucose-Capillary: 225 mg/dL — ABNORMAL HIGH (ref 70–99)
Glucose-Capillary: 204 mg/dL — ABNORMAL HIGH (ref 70–99)

## 2021-05-12 LAB — CBC
HCT: 37.5 % (ref 36.0–46.0)
Hemoglobin: 11.7 g/dL — ABNORMAL LOW (ref 12.0–15.0)
MCH: 23.1 pg — ABNORMAL LOW (ref 26.0–34.0)
MCHC: 31.2 g/dL (ref 30.0–36.0)
MCV: 74 fL — ABNORMAL LOW (ref 80.0–100.0)
Platelets: 292 10*3/uL (ref 150–400)
RBC: 5.07 MIL/uL (ref 3.87–5.11)
RDW: 13.5 % (ref 11.5–15.5)
WBC: 8.2 10*3/uL (ref 4.0–10.5)
nRBC: 0 % (ref 0.0–0.2)

## 2021-05-12 LAB — HEPARIN LEVEL (UNFRACTIONATED)
Heparin Unfractionated: 0.62 IU/mL (ref 0.30–0.70)
Heparin Unfractionated: 0.58 IU/mL (ref 0.30–0.70)

## 2021-05-12 MED ORDER — ENOXAPARIN SODIUM 120 MG/0.8ML IJ SOSY
110.0000 mg | PREFILLED_SYRINGE | Freq: Two times a day (BID) | INTRAMUSCULAR | Status: DC
  Administered 2021-05-12 – 2021-05-13 (×2): 110 mg via SUBCUTANEOUS
  Filled 2021-05-12 (×3): qty 0.74

## 2021-05-12 MED ORDER — MORPHINE SULFATE 15 MG PO TABS
15.0000 mg | ORAL_TABLET | ORAL | Status: DC | PRN
Start: 2021-05-12 — End: 2021-05-13
  Administered 2021-05-12 – 2021-05-13 (×6): 15 mg via ORAL
  Filled 2021-05-12 (×6): qty 1

## 2021-05-12 MED ORDER — MORPHINE SULFATE (PF) 2 MG/ML IV SOLN
1.0000 mg | Freq: Once | INTRAVENOUS | Status: AC
  Administered 2021-05-12: 1 mg via INTRAVENOUS
  Filled 2021-05-12: qty 1

## 2021-05-12 MED ORDER — INSULIN ASPART 100 UNIT/ML IJ SOLN
4.0000 [IU] | Freq: Three times a day (TID) | INTRAMUSCULAR | Status: DC
  Administered 2021-05-12 – 2021-05-13 (×3): 4 [IU] via SUBCUTANEOUS

## 2021-05-12 MED ORDER — FENTANYL CITRATE PF 50 MCG/ML IJ SOSY
25.0000 ug | PREFILLED_SYRINGE | INTRAMUSCULAR | Status: DC | PRN
  Administered 2021-05-12 (×2): 25 ug via INTRAVENOUS
  Filled 2021-05-12 (×2): qty 1

## 2021-05-12 NOTE — Progress Notes (Signed)
ANTICOAGULATION CONSULT NOTE - Follow Up Consult  Pharmacy Consult for Heparin Indication: DVT  Allergies  Allergen Reactions   Amoxapine And Related Other (See Comments)    Burns skin, hair falls out   Amoxicillin Other (See Comments)    Burns skin and hair falls out   Oxycodone Itching   Percocet [Oxycodone-Acetaminophen] Itching   Rocephin [Ceftriaxone] Rash    swelling   Tramadol Itching    Patient Measurements: Height: 5\' 6"  (167.6 cm) Weight: 108.4 kg (239 lb) IBW/kg (Calculated) : 59.3 Heparin Dosing Weight: 84 kg  Vital Signs: Temp: 97.8 F (36.6 C) (10/19 2109) BP: 126/87 (10/19 2109) Pulse Rate: 81 (10/19 2109)  Labs: Recent Labs    05/10/21 1659 05/10/21 1659 05/10/21 2140 05/11/21 0417 05/11/21 1356 05/11/21 1954 05/12/21 0317  HGB 12.0  --   --  11.1*  --   --  11.7*  HCT 39.3  --   --  35.9*  --   --  37.5  PLT 297  --   --  279  --   --  292  APTT  --   --   --  43* 61*  --   --   LABPROT  --   --  13.4  --   --   --   --   INR  --   --  1.0  --   --   --   --   HEPARINUNFRC  --    < >  --  0.29* 0.51 0.77* 0.62  CREATININE 0.91  --   --  0.73  --   --   --    < > = values in this interval not displayed.     Estimated Creatinine Clearance: 115.3 mL/min (by C-G formula based on SCr of 0.73 mg/dL).   Medications:  Scheduled:   influenza vac split quadrivalent PF  0.5 mL Intramuscular Tomorrow-1000   insulin aspart  0-9 Units Subcutaneous TID WC   insulin glargine-yfgn  30 Units Subcutaneous Daily   Infusions:   heparin 1,600 Units/hr (05/11/21 2105)    Assessment: 40 yoF admitted on 10/18 with RLE swelling, pain, and expanding DVT despite PTA Xarelto.  Pharmacy is consulted to dose Heparin IV.  Home Xarelto 20mg  daily.  Per patient, she last took her home dose 10/17 around 2200. - She states her last Xarelto refill was in September at [facility]. - Epic records show last refill on 02/24/21; [facility]  pharmacy reports her last refill on 03/23/21.   - Heparin level was 0.29 while on heparin infusion (would usually expect supra-therapeutic levels of > 1.1 if Xarelto was taken in the past few days)  Today, 05/12/2021 PM: Heparin level therapeutic at 0.62 -no bleeding per RN -CBC stable  Goal of Therapy:  Heparin level 0.3-0.7 units/ml aPTT 66-102 seconds Monitor platelets by anticoagulation protocol: Yes   Plan:  Continue heparin drip at 1600 units/hr Confirmatory heparin level in 6 hours Monitor daily CBC Follow up vascular surgery, heme/onc, long-term anticoagulation plans.  03/25/21 RPh 05/12/2021, 3:47 AM

## 2021-05-12 NOTE — Progress Notes (Signed)
Noted brief episodes of sinus bradycardia, as low as 38 bpm, non-sustaining, not lasting for more than 15 seconds(0340 am, 0600am) Patient is asymptomatic. Blood pressure stable, heart rate mostly stays at 80-90's, NSR.

## 2021-05-12 NOTE — Progress Notes (Signed)
ANTICOAGULATION CONSULT NOTE - Follow Up Consult  Pharmacy Consult for Heparin Indication: DVT  Allergies  Allergen Reactions   Amoxapine And Related Other (See Comments)    Burns skin, hair falls out   Amoxicillin Other (See Comments)    Burns skin and hair falls out   Oxycodone Itching   Percocet [Oxycodone-Acetaminophen] Itching   Rocephin [Ceftriaxone] Rash    swelling   Tramadol Itching    Patient Measurements: Height: 5\' 6"  (167.6 cm) Weight: 108.4 kg (239 lb) IBW/kg (Calculated) : 59.3 Heparin Dosing Weight: 84 kg  Vital Signs: Temp: 97.9 F (36.6 C) (10/20 0428) Temp Source: Oral (10/20 0428) BP: 112/69 (10/20 0428) Pulse Rate: 83 (10/20 0428)  Labs: Recent Labs    05/10/21 1659 05/10/21 1659 05/10/21 2140 05/11/21 0417 05/11/21 1356 05/11/21 1954 05/12/21 0317 05/12/21 0852  HGB 12.0  --   --  11.1*  --   --  11.7*  --   HCT 39.3  --   --  35.9*  --   --  37.5  --   PLT 297  --   --  279  --   --  292  --   APTT  --   --   --  43* 61*  --   --   --   LABPROT  --   --  13.4  --   --   --   --   --   INR  --   --  1.0  --   --   --   --   --   HEPARINUNFRC  --    < >  --  0.29* 0.51 0.77* 0.62 0.58  CREATININE 0.91  --   --  0.73  --   --   --   --    < > = values in this interval not displayed.     Estimated Creatinine Clearance: 115.3 mL/min (by C-G formula based on SCr of 0.73 mg/dL).   Medications:  Scheduled:   influenza vac split quadrivalent PF  0.5 mL Intramuscular Tomorrow-1000   insulin aspart  0-9 Units Subcutaneous TID WC   insulin aspart  4 Units Subcutaneous TID WC   insulin glargine-yfgn  30 Units Subcutaneous Daily   Infusions:   heparin 1,600 Units/hr (05/12/21 0433)    Assessment: 40 yoF admitted on 10/18 with RLE swelling, pain, and expanding DVT despite PTA Xarelto.  Pharmacy is consulted to dose Heparin IV.  Home Xarelto 20mg  daily.  Per patient, she last took her home dose 10/17 around 2200. - She states her last  Xarelto refill was in September at [facility]. - Epic records show last refill on 02/24/21; [facility] pharmacy reports her last refill on 03/23/21.   - Heparin level was 0.29 while on heparin infusion (would usually expect supra-therapeutic levels of > 1.1 if Xarelto was taken in the past few days)  Today, 05/12/2021: Heparin level 0.58, therapeutic on heparin 1600 units/hr CBC:  Hgb low/stable at 11.7, Plt wnl No bleeding or complications reported   Goal of Therapy:  Heparin level 0.3-0.7 units/ml aPTT 66-102 seconds Monitor platelets by anticoagulation protocol: Yes   Plan:  Continue heparin IV infusion at 1600 units/hr Daily heparin level and CBC Follow up vascular surgery, heme/onc, long-term anticoagulation plans.   03/25/21 PharmD, BCPS Clinical Pharmacist [facility] main pharmacy [phone] 05/12/2021 10:03 AM
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
## 2021-05-12 NOTE — Progress Notes (Signed)
Pharmacy Consult for Heparin >> Lovenox Indication: DVT  Allergies  Allergen Reactions   Amoxapine And Related Other (See Comments)    Burns skin, hair falls out   Amoxicillin Other (See Comments)    Burns skin and hair falls out   Oxycodone Itching   Percocet [Oxycodone-Acetaminophen] Itching   Rocephin [Ceftriaxone] Rash    swelling   Tramadol Itching    Patient Measurements: Height: 5\' 6"  (167.6 cm) Weight: 108.4 kg (239 lb) IBW/kg (Calculated) : 59.3 Heparin Dosing Weight: 84 kg  Vital Signs: Temp: 98.2 F (36.8 C) (10/20 1215) Temp Source: Oral (10/20 1215) BP: 123/86 (10/20 1215) Pulse Rate: 88 (10/20 1215)  Labs: Recent Labs    05/10/21 1659 05/10/21 1659 05/10/21 2140 05/11/21 0417 05/11/21 1356 05/11/21 1954 05/12/21 0317 05/12/21 0852  HGB 12.0  --   --  11.1*  --   --  11.7*  --   HCT 39.3  --   --  35.9*  --   --  37.5  --   PLT 297  --   --  279  --   --  292  --   APTT  --   --   --  43* 61*  --   --   --   LABPROT  --   --  13.4  --   --   --   --   --   INR  --   --  1.0  --   --   --   --   --   HEPARINUNFRC  --    < >  --  0.29* 0.51 0.77* 0.62 0.58  CREATININE 0.91  --   --  0.73  --   --   --   --    < > = values in this interval not displayed.     Estimated Creatinine Clearance: 115.3 mL/min (by C-G formula based on SCr of 0.73 mg/dL).   Medications:  Scheduled:   influenza vac split quadrivalent PF  0.5 mL Intramuscular Tomorrow-1000   insulin aspart  0-9 Units Subcutaneous TID WC   insulin aspart  4 Units Subcutaneous TID WC   insulin glargine-yfgn  30 Units Subcutaneous Daily   Infusions:   heparin 1,600 Units/hr (05/12/21 0433)    Assessment: 40 yoF admitted on 10/18 with RLE swelling, pain, and expanding DVT despite PTA Xarelto.  Pharmacy is consulted to dose Heparin IV.  Home Xarelto 20mg  daily.  Per patient, she last took her home dose 10/17 around 2200. - She states her last Xarelto refill was in September at  [facility]. - Epic records show last refill on 02/24/21; [facility] pharmacy reports her last refill on 03/23/21.   - Heparin level was 0.29 while on heparin infusion (would usually expect supra-therapeutic levels of > 1.1 if Xarelto was taken in the past few days)  Today, 05/12/2021: Heparin level 0.58, therapeutic on heparin 1600 units/hr CBC:  Hgb low/stable at 11.7, Plt wnl No bleeding or complications reported  05/12/21 5:13 PM  Orders to transition from heparin drip to lovenox   Goal of Therapy:  Anti-Xa level 0.6-1 units/ml 4hrs after LMWH dose given Monitor platelets by anticoagulation protocol: Yes   Plan:  Will transition patient to Lovenox 110 mg (1 mg/kg) q 12 hours.  Instructed RN to stop heparin infusion at the time of of the first Loveonx injection tonight at 2200.   05/14/2021 D  Clinical Pharmacist [facility] main pharmacy  [phone] 05/12/2021 5:06 PM

## 2021-05-12 NOTE — Progress Notes (Signed)
Inpatient Diabetes Program Recommendations  AACE/ADA: New Consensus Statement on Inpatient Glycemic Control (2015)  Target Ranges:  Prepandial:   less than 140 mg/dL      Peak postprandial:   less than 180 mg/dL (1-2 hours)      Critically ill patients:  140 - 180 mg/dL   Lab Results  Component Value Date   GLUCAP 204 (H) 05/11/2021   HGBA1C 11.6 (H) 02/04/2021    Review of Glycemic Control  Diabetes history: DM2 Outpatient Diabetes medications: Toujeo 30 units QD, Novolog1-10 TID, metformin 500 mg BID Current orders for Inpatient glycemic control: Semglee 30 QD, Novolog 0-9 units TID  HgbA1C - 11.6%  Inpatient Diabetes Program Recommendations:    Add Novolog 4 units TID with meals if eating > 50%.  Spoke with pt at bedside regarding HgbA1C of 11.6%. Pt states she needs to get back on track. Has been drinking regular sodas and juice. Agrees to increasing her monitoring and f/u with PCP. Discussed healthy diet with portion control. Pt denies any hypoglycemia at home. Answered questions.   Continue to follow glucose trends.  Thank you. Ailene Ards, RD, LDN, CDE Inpatient Diabetes Coordinator [phone]

## 2021-05-13 LAB — CBC
HCT: 39.4 % (ref 36.0–46.0)
Hemoglobin: 11.9 g/dL — ABNORMAL LOW (ref 12.0–15.0)
MCH: 22.6 pg — ABNORMAL LOW (ref 26.0–34.0)
MCHC: 30.2 g/dL (ref 30.0–36.0)
MCV: 74.9 fL — ABNORMAL LOW (ref 80.0–100.0)
Platelets: 308 10*3/uL (ref 150–400)
RBC: 5.26 MIL/uL — ABNORMAL HIGH (ref 3.87–5.11)
RDW: 13.7 % (ref 11.5–15.5)
WBC: 7.6 10*3/uL (ref 4.0–10.5)
nRBC: 0 % (ref 0.0–0.2)

## 2021-05-13 LAB — GLUCOSE, CAPILLARY
Glucose-Capillary: 156 mg/dL — ABNORMAL HIGH (ref 70–99)
Glucose-Capillary: 229 mg/dL — ABNORMAL HIGH (ref 70–99)
Glucose-Capillary: 138 mg/dL — ABNORMAL HIGH (ref 70–99)

## 2021-05-13 MED ORDER — HYDROCODONE-ACETAMINOPHEN 5-325 MG PO TABS: 1.0000 | ORAL_TABLET | ORAL | 0 refills | Status: DC | PRN

## 2021-05-13 MED ORDER — ENOXAPARIN SODIUM 120 MG/0.8ML IJ SOSY: 110.0000 mg | PREFILLED_SYRINGE | Freq: Two times a day (BID) | INTRAMUSCULAR | 0 refills | Status: DC

## 2021-05-13 MED ORDER — DIPHENHYDRAMINE HCL 25 MG PO CAPS
25.0000 mg | ORAL_CAPSULE | Freq: Four times a day (QID) | ORAL | Status: AC | PRN
Start: 2021-05-13 — End: 2021-05-13
  Administered 2021-05-13 (×2): 25 mg via ORAL
  Filled 2021-05-13 (×2): qty 1

## 2021-05-13 NOTE — Progress Notes (Signed)
Pt. Was seen by OT to assess for needs. Pt. Is able to preform adls and mobility in room without assist. Pt. Is limping secondary to pain. Pt. Is an agreement that she does not need further OT  05/13/21 1100  OT Visit Information  Last OT Received On 05/13/21  Assistance Needed +1  History of Present Illness Shannon Oneal is a 40 y.o. female with a history of PE/DVTs who presented to the ED 10/18 with abruptly worsening right leg swelling, warmth and pain for the previous 5 days.  Pt. PMH: dm  Precautions  Precautions Fall  Restrictions  Weight Bearing Restrictions No  Home Living  Family/patient expects to be discharged to: Private residence  Living Arrangements Spouse/significant other;Children  Available Help at Discharge Family;Available 24 hours/day  Type of Home House  Home Access Stairs to enter  Entrance Stairs-Rails None  Home Layout Two level;1/2 bath on main level;Bed/bath upstairs  Alternate Level Stairs-Number of Steps 11  Alternate Level Stairs-Rails Left  Bathroom Shower/Tub Insurance risk surveyor  Additional Comments denies falls  Prior Function  Level of Independence Independent  Comments Has attempted use of cructhes in the past, but states that it was not beneficial  Communication  Communication No difficulties  Pain Assessment  Pain Assessment 0-10  Pain Score 5  Pain Location Rt heel/calf  Pain Descriptors / Indicators Aching;Throbbing;Sharp;Radiating  Pain Intervention(s) Premedicated before session  Cognition  Arousal/Alertness Awake/alert  Behavior During Therapy WFL for tasks assessed/performed  Overall Cognitive Status Within Functional Limits for tasks assessed  Upper Extremity Assessment  Upper Extremity Assessment Overall WFL for tasks assessed  Lower Extremity Assessment  Lower Extremity Assessment Defer to PT evaluation  ADL  Overall ADL's  At baseline  Vision- History  Baseline Vision/History 1  Wears glasses (reading)  Ability to See in Adequate Light 0 Adequate  Patient Visual Report No change from baseline  Vision- Assessment  Vision Assessment? No apparent visual deficits  Bed Mobility  Overal bed mobility Modified Independent  Transfers  Equipment used None  General transfer comment mod I, Pt. is limping secondary to pain  OT - End of Session  Activity Tolerance Patient limited by pain  Patient left in bed;with call bell/phone within reach  Nurse Communication  (ok therapy)  OT Assessment  OT Recommendation/Assessment Patient does not need any further OT services  AM-PAC OT "6 Clicks" Daily Activity Outcome Measure (Version 2)  Help from another person eating meals? 4  Help from another person taking care of personal grooming? 4  Help from another person toileting, which includes using toliet, bedpan, or urinal? 4  Help from another person bathing (including washing, rinsing, drying)? 4  Help from another person to put on and taking off regular upper body clothing? 4  Help from another person to put on and taking off regular lower body clothing? 4  6 Click Score 24  Progressive Mobility  What is the highest level of mobility based on the progressive mobility assessment? Level 6 (Walks independently in room and hall) - Balance while walking in room without assist - Complete  Mobility Ambulated independently in room  OT Recommendation  Follow Up Recommendations No OT follow up  OT Equipment None recommended by OT  Acute Rehab OT Goals  Patient Stated Goal get better and not have blood  OT Goal Formulation With patient  OT Time Calculation  OT Start Time (ACUTE ONLY) 1108  OT Stop Time (ACUTE ONLY) 1137  OT Time Calculation (min) 29 min  OT General Charges  $OT Visit 1 Visit  OT Evaluation  $OT Eval Low Complexity 1 Low  Written Expression  Dominant Hand Right  Derrek Gu OT/L

## 2021-05-13 NOTE — Evaluation (Signed)
Physical Therapy Evaluation Patient Details Name: Shannon Oneal MRN: [id number] DOB: 05/29/1981 Today's Date: 05/13/2021     Clinical Impression  Pt is a 40 y.o. female with below HPI resulting in the deficits listed below (see PT Problem List). PT reports (I) at baseline without use of AD. Pt ambulated ~18ft with MIN guard progressing to supervision for safety, HR reaching up to 147bpm during mobility. Pt noted with mild balance deficits due to pain and intermittently reaching for handrails in hallway for support. PT educated pt on trial with use of quad cane for improved stability, pt verbalized understanding and in agreement. Pt lives with multiple family members and will have 24hr assist upon d/c. Pt is currently near baseline mobility level and will benefit from skilled PT services during stay to maximize functional mobility and increase independence, but no follow up PT needs anticipated upon d/c.       Recommendations for follow up therapy are one component of a multi-disciplinary discharge planning process, led by the attending physician.  Recommendations may be updated based on patient status, additional functional criteria and insurance authorization.  Follow Up Recommendations No PT follow up    Equipment Recommendations  None recommended by PT      05/13/21 1000  PT Visit Information  Last PT Received On 05/13/21  Assistance Needed +1  History of Present Illness Pt is a 40 y.o. female who presented to the ED 10/18 with abruptly worsening right leg swelling, warmth and pain for the past 5 days. 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 with a history of PE/DVTs  Precautions  Precautions Fall  Restrictions  Weight Bearing Restrictions No  Home Living  Family/patient expects to be discharged to: Private residence  Living Arrangements Spouse/significant other;Children  Available Help at  Discharge Family;Available 24 hours/day  Type of Home House (town house)  Home Access Stairs to enter  [employer] of Steps  (5)  Entrance Stairs-Rails None  Home Layout Two level;1/2 bath on main level;Bed/bath upstairs  Alternate Level Stairs-Number of Steps 11  Alternate Level Stairs-Rails Left  Bathroom Multimedia programmer - quad  Additional Comments denies falls  Prior Function  Level of Independence Independent  Comments Has attempted use of cructhes in the past, but states that it was not beneficial  Communication  Communication No difficulties  Pain Assessment  Pain Assessment 0-10  Pain Score 4  Pain Location Rt heel/calf  Pain Descriptors / Indicators Aching;Throbbing  Pain Intervention(s) Limited activity within patient's tolerance;Monitored during session;Repositioned;Patient requesting pain meds-RN notified  Cognition  Arousal/Alertness Awake/alert  Behavior During Therapy WFL for tasks assessed/performed  Overall Cognitive Status Within Functional Limits for tasks assessed  Upper Extremity Assessment  Upper Extremity Assessment Defer to OT evaluation  Lower Extremity Assessment  Lower Extremity Assessment RLE deficits/detail;LLE deficits/detail  RLE Deficits / Details grossly 4+/5 throughout  LLE Deficits / Details grossly 4+/5 throughout  Cervical / Trunk Assessment  Cervical / Trunk Assessment Normal  Bed Mobility  Overal bed mobility Modified Independent  General bed mobility comments use of elevated HOB  Transfers  Overall transfer level Modified independent  Equipment used None  General transfer comment pt with use of UEs to power up from bed  Ambulation/Gait  Ambulation/Gait assistance Min guard;Supervision  Gait Distance (Feet) 90 Feet  Assistive device None  Gait Pattern/deviations Step-through pattern;Antalgic;Decreased weight shift to right;Decreased  stance time - right   General Gait Details pt with antalgic gait due to pain in R heel/calf. increased with WB and further ambulation distance. MIN guard progressing to CS for safety. Pt intermittently reaching for handrails in hallway for support. PT educated pt on trial with use of quad cane for improved stability while experiencing increased pain levels, pt reports she has one at home she can use that she was thinking about using upon d/c. Pt's HR reaching up to 147bpm during ambulation, recovery to 100bpm at end of session following rest, RN notified.  Gait velocity decr  Balance  Overall balance assessment Mild deficits observed, not formally tested  PT - End of Session  Equipment Utilized During Treatment Gait belt  Activity Tolerance Patient tolerated treatment well;Patient limited by pain  Patient left in bed;with call bell/phone within reach  Nurse Communication Mobility status;Patient requests pain meds  PT Assessment  PT Recommendation/Assessment Patient needs continued PT services  PT Visit Diagnosis Unsteadiness on feet (R26.81);Pain  Pain - Right/Left Right  Pain - part of body Leg;Ankle and joints of foot  PT Problem List Decreased strength;Decreased activity tolerance;Decreased balance;Decreased mobility;Pain  PT Plan  PT Frequency (ACUTE ONLY) Min 3X/week  PT Treatment/Interventions (ACUTE ONLY) DME instruction;Gait training;Stair training;Functional mobility training;Therapeutic activities;Therapeutic exercise;Balance training;Patient/family education  AM-PAC PT "6 Clicks" Mobility Outcome Measure (Version 2)  Help needed turning from your back to your side while in a flat bed without using bedrails? 4  Help needed moving from lying on your back to sitting on the side of a flat bed without using bedrails? 4  Help needed moving to and from a bed to a chair (including a wheelchair)? 3  Help needed standing up from a chair using your arms (e.g., wheelchair or bedside chair)? 3  Help needed to walk  in hospital room? 3  Help needed climbing 3-5 steps with a railing?  3  6 Click Score 20  Consider Recommendation of Discharge To: Home with no services  Progressive Mobility  What is the highest level of mobility based on the progressive mobility assessment? Level 5 (Walks with assist in room/hall) - Balance while stepping forward/back and can walk in room with assist - Complete  Mobility Ambulated with assistance in hallway  PT Recommendation  Follow Up Recommendations No PT follow up  PT equipment None recommended by PT  Individuals Consulted  Consulted and Agree with Results and Recommendations Patient  Acute Rehab PT Goals  Patient Stated Goal less pain when walking  PT Goal Formulation With patient  Time For Goal Achievement 05/27/21  Potential to Achieve Goals Good  PT Time Calculation  PT Start Time (ACUTE ONLY) 0958  PT Stop Time (ACUTE ONLY) 1020  PT Time Calculation (min) (ACUTE ONLY) 22 min  PT General Charges  $$ ACUTE PT VISIT 1 Visit  PT Evaluation  $PT Eval Low Complexity 1 Low  Written Expression  Dominant Hand Right       Lyman Speller PT, DPT  Acute Rehabilitation Services  Office [phone] 05/13/2021, 12:17 PM
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
## 2021-05-13 NOTE — TOC Transition Note (Addendum)
Transition of Care [facility]) - CM/SW Discharge Note   Patient Details  Name: Shannon Oneal MRN: [id number] Date of Birth: 26-Oct-1980  [facility]) CM/SW Contact:  Darleene Cleaver, LCSW Phone Number: 05/13/2021, 12:49 PM   Clinical Narrative:    Patient was approved for Match program for reduced cost of her prescriptions.  Match letter provided to patient CSW explained that patient can only use Match once a year, and also that she has 7 days to use the Match letter.  CSW signing off please reconsult if social work needs arise.   Final next level of care: Home/Self Care Barriers to Discharge: Continued Medical Work up   Patient Goals and CMS Choice Patient states their goals for this hospitalization and ongoing recovery are:: home CMS Medicare.gov Compare Post Acute Care list provided to:: Patient    Discharge Placement                       Discharge Plan and Services   Discharge Planning Services: CM Consult                                 Social Determinants of Health (SDOH) Interventions     Readmission Risk Interventions No flowsheet data found.

## 2021-05-13 NOTE — Progress Notes (Signed)
RN educated patient on discharge instructions. IV and telemetry removed.

## 2021-05-16 ENCOUNTER — Telehealth: Payer: Self-pay | Admitting: *Deleted

## 2021-05-16 NOTE — Telephone Encounter (Signed)
  Discharge  [facility] 05-13-2021  1st attempt left message

## 2021-05-19 ENCOUNTER — Other Ambulatory Visit: Payer: Self-pay | Admitting: Physician Assistant

## 2021-05-19 DIAGNOSIS — I82491 Acute embolism and thrombosis of other specified deep vein of right lower extremity: Secondary | ICD-10-CM

## 2021-05-19 DIAGNOSIS — I2699 Other pulmonary embolism without acute cor pulmonale: Secondary | ICD-10-CM

## 2021-05-20 ENCOUNTER — Other Ambulatory Visit: Payer: Self-pay

## 2021-05-20 ENCOUNTER — Inpatient Hospital Stay (HOSPITAL_BASED_OUTPATIENT_CLINIC_OR_DEPARTMENT_OTHER): Payer: Self-pay | Admitting: Physician Assistant

## 2021-05-20 ENCOUNTER — Inpatient Hospital Stay: Payer: Self-pay | Attending: Physician Assistant

## 2021-05-20 VITALS — BP 130/83 | HR 88 | Temp 97.7°F | Resp 16 | Wt 235.0 lb

## 2021-05-20 DIAGNOSIS — Z86718 Personal history of other venous thrombosis and embolism: Secondary | ICD-10-CM | POA: Insufficient documentation

## 2021-05-20 DIAGNOSIS — I2699 Other pulmonary embolism without acute cor pulmonale: Secondary | ICD-10-CM

## 2021-05-20 DIAGNOSIS — Z7901 Long term (current) use of anticoagulants: Secondary | ICD-10-CM | POA: Insufficient documentation

## 2021-05-20 DIAGNOSIS — Z794 Long term (current) use of insulin: Secondary | ICD-10-CM | POA: Insufficient documentation

## 2021-05-20 DIAGNOSIS — Z79899 Other long term (current) drug therapy: Secondary | ICD-10-CM | POA: Insufficient documentation

## 2021-05-20 DIAGNOSIS — I82491 Acute embolism and thrombosis of other specified deep vein of right lower extremity: Secondary | ICD-10-CM

## 2021-05-20 DIAGNOSIS — E119 Type 2 diabetes mellitus without complications: Secondary | ICD-10-CM | POA: Insufficient documentation

## 2021-05-20 DIAGNOSIS — I82401 Acute embolism and thrombosis of unspecified deep veins of right lower extremity: Secondary | ICD-10-CM

## 2021-05-20 DIAGNOSIS — Z86711 Personal history of pulmonary embolism: Secondary | ICD-10-CM | POA: Insufficient documentation

## 2021-05-20 LAB — CMP (CANCER CENTER ONLY)
ALT: 36 U/L (ref 0–44)
AST: 30 U/L (ref 15–41)
Albumin: 3.3 g/dL — ABNORMAL LOW (ref 3.5–5.0)
Alkaline Phosphatase: 61 U/L (ref 38–126)
Anion gap: 7 (ref 5–15)
BUN: 11 mg/dL (ref 6–20)
CO2: 24 mmol/L (ref 22–32)
Calcium: 9 mg/dL (ref 8.9–10.3)
Chloride: 107 mmol/L (ref 98–111)
Creatinine: 0.9 mg/dL (ref 0.44–1.00)
GFR, Estimated: >60 mL/min (ref >60)
Glucose, Bld: 168 mg/dL — ABNORMAL HIGH (ref 70–99)
Potassium: 4.3 mmol/L (ref 3.5–5.1)
Sodium: 138 mmol/L (ref 135–145)
Total Bilirubin: 0.4 mg/dL (ref 0.3–1.2)
Total Protein: 7.8 g/dL (ref 6.5–8.1)

## 2021-05-20 LAB — CBC WITH DIFFERENTIAL (CANCER CENTER ONLY)
Abs Immature Granulocytes: 0.02 10*3/uL (ref 0.00–0.07)
Basophils Absolute: 0.1 10*3/uL (ref 0.0–0.1)
Basophils Relative: 1 %
Eosinophils Absolute: 0.3 10*3/uL (ref 0.0–0.5)
Eosinophils Relative: 4 %
HCT: 39.3 % (ref 36.0–46.0)
Hemoglobin: 12.1 g/dL (ref 12.0–15.0)
Immature Granulocytes: 0 %
Lymphocytes Relative: 45 %
Lymphs Abs: 3.3 10*3/uL (ref 0.7–4.0)
MCH: 22.6 pg — ABNORMAL LOW (ref 26.0–34.0)
MCHC: 30.8 g/dL (ref 30.0–36.0)
MCV: 73.3 fL — ABNORMAL LOW (ref 80.0–100.0)
Monocytes Absolute: 0.5 10*3/uL (ref 0.1–1.0)
Monocytes Relative: 6 %
Neutro Abs: 3.2 10*3/uL (ref 1.7–7.7)
Neutrophils Relative %: 44 %
Platelet Count: 221 10*3/uL (ref 150–400)
RBC: 5.36 MIL/uL — ABNORMAL HIGH (ref 3.87–5.11)
RDW: 13.9 % (ref 11.5–15.5)
WBC Count: 7.4 10*3/uL (ref 4.0–10.5)
nRBC: 0 % (ref 0.0–0.2)

## 2021-05-20 MED ORDER — MORPHINE SULFATE 15 MG PO TABS: 15.0000 mg | ORAL_TABLET | Freq: Four times a day (QID) | ORAL | 0 refills | Status: DC | PRN

## 2021-05-20 NOTE — Progress Notes (Signed)
[facility] Telephone:([phone]) [phone]   [phone]  PROGRESS NOTE  Patient Care Team: [name], Bonna Gains, NP as PCP - General (Internal Medicine)  Hematological/Oncological History [phone]: Diagnosed with pulmonary emboli and completed 6 months of anticoagulation. Initially treated with Eliquis and then switched to Pradaxa due to insurance coverage.   2)01/18/2021: CT chest was positive for bilateral pulmonary emboli. Doppler US was positive for right leg DVT. Vascular surgery did not recommend thrombolysis. Patient was discharged on Lovenox (could not afford copay for Xarelto or Eliqius).   3) 01/19/2021: Hypercoagulable workup was unremarkable except for mild increase in beta-2 glycoprotein IgA 40.   4) 01/22/2021: Switched to Xarelto since patient cannot afford copay for Lovenox. Given 30 day free Xarelto coupon card.   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 Vascular surgery consulted and did not recommend surgery. Patient was compliant with Xarelto so switched to Lovenox at discharge.   CHIEF COMPLAINTS/PURPOSE OF CONSULTATION:  "Recurrent PE/DVT "  HISTORY OF PRESENTING ILLNESS:  Shannon Oneal 40 y.o. female returns for a follow up for recurrent rigth lower extremity DVT. Since the last visit on 02/04/2021, patient was readmitted for recurrent DVT in the right leg. She was discharged on Lovenox as she developed the new DVT while she was taking Xarelto. Patient is unaccompanied for this visit.   On exam today, Shannon Oneal reports that her right lower extremity edema and pain has not improved since hospital discharge. She walks independently but her gait is affected with long distances. She requires pain medication but adds that current pain medication, hydrocodone, causes itchiness. She is tolerating the daily lovenox injections and denies  any signs of bleeding. Patient reports her energy and appetite are fairly stable. She denies nausea, vomiting or abdominal pain. Her bowel habits are unchanged. She denies any fevers, chills, night sweats, shortness of breath, chest pain or cough. She has no other complaints.   MEDICAL HISTORY:  Past Medical History:  Diagnosis Date   Acute deep vein thrombosis (DVT) of right lower extremity (HCC) 01/18/2021   Acute pulmonary embolism (HCC) 01/19/2021   Asthma    Cellulitis in diabetic foot (HCC)    Diabetes mellitus without complication (HCC)    DVT (deep venous thrombosis) (HCC) 02/05/2021   PE (pulmonary thromboembolism) (HCC) 2016    SURGICAL HISTORY: Past Surgical History:  Procedure Laterality Date   adenoids     CESAREAN SECTION      SOCIAL HISTORY: Social History   Socioeconomic History   Marital status: Married    Spouse name: Not on file   Number of children: Not on file   Years of education: Not on file   Highest education level: Not on file  Occupational History   Not on file  Tobacco Use   Smoking status: Never   Smokeless tobacco: Never  Vaping Use   Vaping Use: Former   Substances: CBD  Substance and Sexual Activity   Alcohol use: Yes   Drug use: Never   Sexual activity: Not on file  Other Topics Concern   Not on file  Social History Narrative   Not on file   Social Determinants of Health   Financial Resource Strain: Not on file  Food Insecurity: Not on file  Transportation Needs: Not on file  Physical Activity: Not on file  Stress: Not on file  Social Connections: Not on file  Intimate Partner Violence: Not on file    FAMILY HISTORY: Family History  Problem Relation Age of Onset   Deep vein thrombosis Father     ALLERGIES:  is allergic to amoxapine and related, amoxicillin, oxycodone, percocet [oxycodone-acetaminophen], rocephin [ceftriaxone], and tramadol.  MEDICATIONS:  Current Outpatient Medications  Medication Sig Dispense Refill    acetaminophen (TYLENOL) 325 MG tablet Take 2 tablets (650 mg total) by mouth every 6 (six) hours as needed for mild pain, fever or headache. 30 tablet 0   Continuous Blood Gluc Sensor (FREESTYLE LIBRE 14 DAY SENSOR) MISC 1 Units by Does not apply route every 14 (fourteen) days. 2 each 11   enoxaparin (LOVENOX) 120 MG/0.8ML injection Inject 0.74 mLs (110 mg total) into the skin every 12 (twelve) hours. 44.4 mL 0   insulin aspart (NOVOLOG FLEXPEN) 100 UNIT/ML FlexPen Inject 1-10 Units into the skin 3 (three) times daily with meals. Per sliding scale     Insulin Pen Needle 31G X 5 MM MISC Three times daily 90 each 0   metFORMIN (GLUCOPHAGE) 500 MG tablet Take 1 tablet (500 mg total) by mouth 2 (two) times daily with a meal. 180 tablet 1   morphine (MSIR) 15 MG tablet Take 1 tablet (15 mg total) by mouth every 6 (six) hours as needed for severe pain or moderate pain. 60 tablet 0   TOUJEO SOLOSTAR 300 UNIT/ML Solostar Pen Inject 30 Units into the skin daily. 1.5 mL 3   No current facility-administered medications for this visit.    REVIEW OF SYSTEMS:   Constitutional: ( - ) fevers, ( - )  chills , ( - ) night sweats Eyes: ( - ) blurriness of vision, ( - ) double vision, ( - ) watery eyes Ears, nose, mouth, throat, and face: ( - ) mucositis, ( - ) sore throat Respiratory: ( - ) cough, ( - ) dyspnea, ( - ) wheezes Cardiovascular: ( - ) palpitation, ( - ) chest discomfort, ( +) lower extremity swelling Gastrointestinal:  ( - ) nausea, ( - ) heartburn, ( - ) change in bowel habits Skin: ( - ) abnormal skin rashes Lymphatics: ( - ) new lymphadenopathy, ( - ) easy bruising Neurological: ( - ) numbness, ( - ) tingling, ( - ) new weaknesses Behavioral/Psych: ( - ) mood change, ( - ) new changes  All other systems were reviewed with the patient and are negative.  PHYSICAL EXAMINATION: ECOG PERFORMANCE STATUS: 1 - Symptomatic but completely ambulatory  Vitals:   05/20/21 1040  BP: 130/83  Pulse: 88   Resp: 16  Temp: 97.7 F (36.5 C)  SpO2: 100%   Filed Weights   05/20/21 1040  Weight: 235 lb (106.6 kg)    GENERAL: well appearing African American female in NAD  SKIN: skin color, texture, turgor are normal, no rashes or significant lesions EYES: conjunctiva are pink and non-injected, sclera clear OROPHARYNX: no exudate, no erythema; lips, buccal mucosa, and tongue normal  LYMPH:  no palpable lymphadenopathy in the cervical or supraclavicular lymph nodes.  LUNGS: clear to auscultation and percussion with normal breathing effort HEART: regular rate & rhythm and no murmurs and no lower extremity edema ABDOMEN: soft, non-tender, non-distended, normal bowel sounds Musculoskeletal: no cyanosis of digits and no clubbing. Right lower extremity 2+ pitting edema from knee to foot. Some tenderness to palpation. No discoloration or open blisters.  PSYCH: alert & oriented x 3, fluent speech NEURO: no focal motor/sensory deficits  LABORATORY DATA:  I  have reviewed the data as listed CBC Latest Ref Rng & Units 05/20/2021 05/13/2021 05/12/2021  WBC 4.0 - 10.5 K/uL 7.4 7.6 8.2  Hemoglobin 12.0 - 15.0 g/dL 66.4 11.9(L) 11.7(L)  Hematocrit 36.0 - 46.0 % 39.3 39.4 37.5  Platelets 150 - 400 K/uL 221 308 292    CMP Latest Ref Rng & Units 05/20/2021 05/11/2021 05/10/2021  Glucose 70 - 99 mg/dL 403(K) 742(V) 956(L)  BUN 6 - 20 mg/dL 11 10 9   Creatinine 0.44 - 1.00 mg/dL 8.75 6.43  Sodium 135 - 145 mmol/L 138 137 136  Potassium 3.5 - 5.1 mmol/L 4.3 3.9 4.3  Chloride 98 - 111 mmol/L 107 108 104  CO2 22 - 32 mmol/L 24 23 26   Calcium 8.9 - 10.3 mg/dL 9.0 3.29) )  Total Protein 6.5 - 8.1 g/dL 7.8 - -  Total Bilirubin 0.3 - 1.2 mg/dL 0.4 - -  Alkaline Phos 38 - 126 U/L 61 - -  AST 15 - 41 U/L 30 - -  ALT 0 - 44 U/L 36 - -   RADIOGRAPHIC STUDIES: I have personally reviewed the radiological images as listed and agreed with the findings in the report. VAS 5.1(O LOWER EXTREMITY VENOUS (DVT)  (ONLY [facility] & [facility])  Result Date: 05/11/2021  Lower Venous DVT Study Patient Name:  VENTURA LEGGITT  Date of Exam:   05/10/2021 Medical Rec #: [name]    Accession #:    05/12/2021 Date of Birth: 1980/08/22   Patient Gender: F Patient Age:   72 years Exam Location:  [facility] Procedure:      VAS 46 LOWER EXTREMITY VENOUS (DVT) Referring Phys: [facility] --------------------------------------------------------------------------------  Indications: Increased pain and edema to RLE ankle and calf for the past 4 days. Hx of extensive DVT and PE (12/2020).  Anticoagulation: Xarelto. Limitations: Poor ultrasound/tissue interface. Comparison       Previous exam 03/01/2021 - Chronic DVT RLE FV & PopV and SVT in Study:           SSV. Performing Technologist: 03-26-1978 RVT, RDMS  Examination Guidelines: A complete evaluation includes B-mode imaging, spectral Doppler, color Doppler, and power Doppler as needed of all accessible portions of each vessel. Bilateral testing is considered an integral part of a complete examination. Limited examinations for reoccurring indications may be performed as noted. The reflux portion of the exam is performed with the patient in reverse Trendelenburg.  +---------+---------------+---------+-----------+----------+--------------+ RIGHT    CompressibilityPhasicitySpontaneityPropertiesThrombus Aging +---------+---------------+---------+-----------+----------+--------------+ CFV      Full           Yes      Yes                                 +---------+---------------+---------+-----------+----------+--------------+ SFJ      Full                                                        +---------+---------------+---------+-----------+----------+--------------+ FV Prox  Full           Yes      Yes                                 +---------+---------------+---------+-----------+----------+--------------+ FV Mid  Partial        Yes      Yes                   Chronic        +---------+---------------+---------+-----------+----------+--------------+ FV DistalPartial        Yes      Yes                  Chronic        +---------+---------------+---------+-----------+----------+--------------+ PFV      Full                                                        +---------+---------------+---------+-----------+----------+--------------+ POP      None           No       No                   Chronic        +---------+---------------+---------+-----------+----------+--------------+ PTV      None           No       No                   Acute          +---------+---------------+---------+-----------+----------+--------------+ PERO     None           No       No                   Acute          +---------+---------------+---------+-----------+----------+--------------+ SSV      None           No       No                   Chronic        +---------+---------------+---------+-----------+----------+--------------+   +----+---------------+---------+-----------+----------+--------------+ LEFTCompressibilityPhasicitySpontaneityPropertiesThrombus Aging +----+---------------+---------+-----------+----------+--------------+ CFV Full           Yes      Yes                                 +----+---------------+---------+-----------+----------+--------------+     Summary: RIGHT: - Findings consistent with acute deep vein thrombosis involving the right peroneal veins, and right posterior tibial veins. 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 - Findings consistent with chronic superficial vein thrombosis involving the right small saphenous vein. - Findings suggest new clot formation as compared to previous examination. - No cystic structure found in the popliteal fossa.  LEFT: - No evidence of common femoral vein obstruction.  *See table(s) above for measurements  and observations. Electronically signed by Waverly Ferrari MD on 05/11/2021 at 7:13:08 AM.    Final     ASSESSMENT & PLAN Earnie Arne is a 40 y.o. female who presents to the clinic for recurrent DVT and PE.  #Recurrent DVT/PE: --No provoking factor identified so recommend indefinite anticoagulation.  --Most recent episode involved acute DVT involving right peroneal veins and right posterior tibial veins. --Patient developed most recent DVT in October 2022 while on Xarelto. She is currently on Lovenox.  --Repeat antiphospholipid antibody levels today show mild  elevation but does not meet criteria for diagnosis. --Due to recurrent DVT in the right leg, we will order CT venogram to rule out May Thurners.  --Due to ongoing pain and intolerance to Hydrocodone, so I sent MSIR 15 mg q 6 hours as needed. --RTC in 3 months with labs.  Orders Placed This Encounter  Procedures   CT VENOGRAM ABD/PEL    Standing Status:   Future    Standing Expiration Date:   05/20/2022    Order Specific Question:   Is patient pregnant?    Answer:   No    Order Specific Question:   Preferred imaging location?    Answer:   [facility]     All questions were answered. The patient knows to call the clinic with any problems, questions or concerns.  I have spent a total of 30 minutes minutes of face-to-face and non-face-to-face time, preparing to see the patient, obtaining and/or reviewing separately obtained history, performing a medically appropriate examination, counseling and educating the patient, ordering medications/tests,  documenting clinical information in the electronic health record,and care coordination.    Shannon Kaufmann, PA-C Department of Hematology/Oncology [facility] Phone: [phone]

## 2021-05-21 LAB — BETA-2-GLYCOPROTEIN I ABS, IGG/M/A
Beta-2 Glyco I IgG: <9 GPI IgG units (ref 0–20)
Beta-2-Glycoprotein I IgA: 31 GPI IgA units — ABNORMAL HIGH (ref 0–25)
Beta-2-Glycoprotein I IgM: 27 GPI IgM units (ref 0–32)

## 2021-05-22 LAB — CARDIOLIPIN ANTIBODIES, IGG, IGM, IGA
Anticardiolipin IgA: <9 APL U/mL (ref 0–11)
Anticardiolipin IgG: <9 GPL U/mL (ref 0–14)
Anticardiolipin IgM: 17 MPL U/mL — ABNORMAL HIGH (ref 0–12)

## 2021-05-22 LAB — LUPUS ANTICOAGULANT PANEL
DRVVT: 37.9 s (ref 0.0–47.0)
PTT Lupus Anticoagulant: 30.1 s (ref 0.0–51.9)

## 2021-05-23 ENCOUNTER — Encounter: Payer: Self-pay | Admitting: Physician Assistant

## 2021-05-23 ENCOUNTER — Telehealth: Payer: Self-pay

## 2021-05-23 NOTE — Telephone Encounter (Signed)
Transition Care Management Unsuccessful Follow-up Telephone Call  Date of discharge and from where:  05/13/21 from [facility]  Attempts:  2nd Attempt  Reason for unsuccessful TCM follow-up call:  Unable to reach patient. Call continued to drop on multiple attempts.   Kathyrn Sheriff, RN, MSN, BSN, CCM [facility] Care Management Coordinator [phone]

## 2021-05-24 ENCOUNTER — Other Ambulatory Visit: Payer: Self-pay

## 2021-05-24 ENCOUNTER — Telehealth: Payer: Self-pay | Admitting: Nurse Practitioner

## 2021-05-24 ENCOUNTER — Encounter: Payer: Self-pay | Admitting: Nurse Practitioner

## 2021-05-24 ENCOUNTER — Encounter: Payer: Self-pay | Admitting: *Deleted

## 2021-05-24 ENCOUNTER — Ambulatory Visit (INDEPENDENT_AMBULATORY_CARE_PROVIDER_SITE_OTHER): Payer: 59 | Admitting: Nurse Practitioner

## 2021-05-24 VITALS — BP 102/60 | HR 86 | Temp 97.1°F | Wt 235.0 lb

## 2021-05-24 DIAGNOSIS — E1140 Type 2 diabetes mellitus with diabetic neuropathy, unspecified: Secondary | ICD-10-CM | POA: Insufficient documentation

## 2021-05-24 DIAGNOSIS — Z794 Long term (current) use of insulin: Secondary | ICD-10-CM

## 2021-05-24 DIAGNOSIS — K5903 Drug induced constipation: Secondary | ICD-10-CM | POA: Diagnosis not present

## 2021-05-24 DIAGNOSIS — I82401 Acute embolism and thrombosis of unspecified deep veins of right lower extremity: Secondary | ICD-10-CM

## 2021-05-24 DIAGNOSIS — Z23 Encounter for immunization: Secondary | ICD-10-CM | POA: Diagnosis not present

## 2021-05-24 DIAGNOSIS — E1165 Type 2 diabetes mellitus with hyperglycemia: Secondary | ICD-10-CM | POA: Diagnosis not present

## 2021-05-24 DIAGNOSIS — Z1231 Encounter for screening mammogram for malignant neoplasm of breast: Secondary | ICD-10-CM

## 2021-05-24 LAB — POCT GLYCOSYLATED HEMOGLOBIN (HGB A1C): Hemoglobin A1C: 8.2 % — AB (ref 4.0–5.6)

## 2021-05-24 LAB — MICROALBUMIN / CREATININE URINE RATIO
Creatinine,U: 354.6 mg/dL
Microalb Creat Ratio: 0.2 mg/g (ref 0.0–30.0)
Microalb, Ur: 0.8 mg/dL (ref 0.0–1.9)

## 2021-05-24 MED ORDER — FREESTYLE LIBRE 14 DAY SENSOR MISC: 1.0000 [IU] | 11 refills | Status: DC

## 2021-05-24 MED ORDER — PEN NEEDLES 31G X 6 MM MISC: 1.0000 "application " | 2 refills | Status: DC

## 2021-05-24 MED ORDER — OZEMPIC (0.25 OR 0.5 MG/DOSE) 2 MG/1.5ML ~~LOC~~ SOPN: PEN_INJECTOR | SUBCUTANEOUS | 5 refills | Status: DC

## 2021-05-24 MED ORDER — SENNA-DOCUSATE SODIUM 8.6-50 MG PO TABS: 1.0000 | ORAL_TABLET | Freq: Every day | ORAL | 3 refills | Status: DC

## 2021-05-24 MED ORDER — FREESTYLE LIBRE READER DEVI
1.0000 [IU] | Freq: Once | 0 refills | Status: DC
Start: 2021-05-24 — End: 2021-05-26

## 2021-05-24 NOTE — Assessment & Plan Note (Addendum)
Has appt with vascular specialist 06/13/21. Had appt with hematology 05/20/21. Leg pain managed with morphine. No improvement with Vicodin or fentanyl Has upcoming appt for venogram. Current use of lovenox injection BID. Denies any GI/GU bleed, denies any menorrhagia (LMP 04/28/2021). Advised about importance of contraception with current medication and health status. She verbalized understanding and plans to use condoms.

## 2021-05-24 NOTE — Assessment & Plan Note (Signed)
Due to use of morphine. No improvement with miralax and colace Some improvement with magnesium citrate.  Start sennokot daily Advised about importance of high fiber diet and adequate oral hydration.

## 2021-05-24 NOTE — Assessment & Plan Note (Signed)
Previous use of lyrica BID(unknown dose), gabapentin 300mg  BID and cymbalta at hs (unknown dose). No improvement with these medications. Consider use of elavil?

## 2021-05-24 NOTE — Progress Notes (Signed)
Reviewed with patient in office. See office note

## 2021-05-24 NOTE — Assessment & Plan Note (Signed)
Uncontrolled DM: Fasting glucose 160-755, post prandial glucose >200 Admits to high carb/sugar diet. Denies need for nutritionist referral. States she is aware of necessary dietary modifications needed. Current use of novolog SSI with meals, Toujeo 30units at HS and metformin 500mg  BID. Denies any hypoglycemia.  POCT HgbA1c at 8.2% today. We discussed use of ozempic injections: dosing, and possible side effects. She verbalized understanding and agreed to start injection. Ozempic Rx sent Also sent Freestyle Libre CGM to minimize multiple finger sticks. Maintain other medication doses F/up in 22month

## 2021-05-24 NOTE — Telephone Encounter (Signed)
Please advise 

## 2021-05-24 NOTE — Patient Instructions (Addendum)
You will be contacted to schedule appt for mammogram.  Use senna daily Maintain low carb/low sugar, high fiber diet Maintain adequate oral hydration (at least 60oz of water per day).  Go to lab for urine collection.  Check glucose before meals and at bedtime. Bring glucose readings to next office visit.  Start ozempic as discussed hgbA1c at 8.2% today

## 2021-05-24 NOTE — Progress Notes (Signed)
Subjective:  Patient ID: Shannon Oneal, female    DOB: July 26, 1980  Age: 40 y.o. MRN: [id number]  CC: Hospitalization Follow-up [facility] f/u due to new blood clots in her the right calf. Pt states medication that she was taking to help with this was causing constipation. )  HPI  Type 2 diabetes mellitus with hyperglycemia (HCC) Uncontrolled DM: Fasting glucose 160-755, post prandial glucose >200 Admits to high carb/sugar diet. Denies need for nutritionist referral. States she is aware of necessary dietary modifications needed. Current use of novolog SSI with meals, Toujeo 30units at HS and metformin 500mg  BID. Denies any hypoglycemia.  POCT HgbA1c at 8.2% today. We discussed use of ozempic injections: dosing, and possible side effects. She verbalized understanding and agreed to start injection. Ozempic Rx sent Also sent Freestyle Libre CGM to minimize multiple finger sticks. Maintain other medication doses F/up in 35month  Drug-induced constipation Due to use of morphine. No improvement with miralax and colace Some improvement with magnesium citrate.  Start sennokot daily Advised about importance of high fiber diet and adequate oral hydration.  Diabetic neuropathy (HCC) Previous use of lyrica BID(unknown dose), gabapentin 300mg  BID and cymbalta at hs (unknown dose). No improvement with these medications. Consider use of elavil?  Deep vein thrombosis (DVT) of right lower extremity (HCC) Has appt with vascular specialist 06/13/21. Had appt with hematology 05/20/21. Leg pain managed with morphine. No improvement with Vicodin or fentanyl Has upcoming appt for venogram. Current use of lovenox injection BID. Denies any GI/GU bleed, denies any menorrhagia (LMP 04/28/2021). Advised about importance of contraception with current medication and health status. She verbalized understanding and plans to use condoms.   Reviewed past Medical, Social and Family history today.  Outpatient  Medications Prior to Visit  Medication Sig Dispense Refill   acetaminophen (TYLENOL) 325 MG tablet Take 2 tablets (650 mg total) by mouth every 6 (six) hours as needed for mild pain, fever or headache. 30 tablet 0   enoxaparin (LOVENOX) 120 MG/0.8ML injection Inject 0.74 mLs (110 mg total) into the skin every 12 (twelve) hours. 44.4 mL 0   insulin aspart (NOVOLOG FLEXPEN) 100 UNIT/ML FlexPen Inject 1-10 Units into the skin 3 (three) times daily with meals. Per sliding scale     metFORMIN (GLUCOPHAGE) 500 MG tablet Take 1 tablet (500 mg total) by mouth 2 (two) times daily with a meal. 180 tablet 1   morphine (MSIR) 15 MG tablet Take 1 tablet (15 mg total) by mouth every 6 (six) hours as needed for severe pain or moderate pain. 60 tablet 0   Continuous Blood Gluc Sensor (FREESTYLE LIBRE 14 DAY SENSOR) MISC 1 Units by Does not apply route every 14 (fourteen) days. 2 each 11   Insulin Pen Needle 31G X 5 MM MISC Three times daily 90 each 0   TOUJEO SOLOSTAR 300 UNIT/ML Solostar Pen Inject 30 Units into the skin daily. 1.5 mL 3   No facility-administered medications prior to visit.    ROS See HPI  Objective:  BP 102/60 (BP Location: Right Arm, Patient Position: Sitting, Cuff Size: Large)   Pulse 86   Temp (!) 97.1 F (36.2 C) (Temporal)   Wt 235 lb (106.6 kg)   LMP 04/28/2021 (Exact Date)   SpO2 100%   BMI 37.93 kg/m   Physical Exam  Assessment & Plan:  This visit occurred during the SARS-CoV-2 public health emergency.  Safety protocols were in place, including screening questions prior to the visit, additional usage of staff PPE,  and extensive cleaning of exam room while observing appropriate contact time as indicated for disinfecting solutions.   Shannon Oneal was seen today for hospitalization follow-up.  Diagnoses and all orders for this visit:  Type 2 diabetes mellitus with hyperglycemia, with long-term current use of insulin (HCC) -     Microalbumin / creatinine urine ratio -      Continuous Blood Gluc Sensor (FREESTYLE LIBRE 14 DAY SENSOR) MISC; 1 Units by Does not apply route every 14 (fourteen) days. -     Continuous Blood Gluc Receiver (FREESTYLE LIBRE READER) DEVI; 1 Units by Does not apply route once for 1 dose. -     POCT HgB A1C -     Semaglutide,0.25 or 0.5MG /DOS, (OZEMPIC, 0.25 OR 0.5 MG/DOSE,) 2 MG/1.5ML SOPN; 0.25mg  weekly x 2weeks, then 0.5weekly continuously -     Insulin Pen Needle (PEN NEEDLES) 31G X 6 MM MISC; 1 application by Does not apply route once a week. For ozempic injection  Breast cancer screening by mammogram -     MM 3D SCREEN BREAST BILATERAL; Future  Drug-induced constipation -     sennosides-docusate sodium (SENOKOT-S) 8.6-50 MG tablet; Take 1 tablet by mouth daily.  Need for pneumococcal vaccine -     Pneumococcal conjugate vaccine 20-valent (Prevnar 20)  Acute deep vein thrombosis (DVT) of right lower extremity, unspecified vein (HCC)   Problem List Items Addressed This Visit       Cardiovascular and Mediastinum   Deep vein thrombosis (DVT) of right lower extremity (HCC)    Has appt with vascular specialist 06/13/21. Had appt with hematology 05/20/21. Leg pain managed with morphine. No improvement with Vicodin or fentanyl Has upcoming appt for venogram. Current use of lovenox injection BID. Denies any GI/GU bleed, denies any menorrhagia (LMP 04/28/2021). Advised about importance of contraception with current medication and health status. She verbalized understanding and plans to use condoms.        Digestive   Drug-induced constipation    Due to use of morphine. No improvement with miralax and colace Some improvement with magnesium citrate.  Start sennokot daily Advised about importance of high fiber diet and adequate oral hydration.      Relevant Medications   sennosides-docusate sodium (SENOKOT-S) 8.6-50 MG tablet     Endocrine   Type 2 diabetes mellitus with hyperglycemia (HCC) - Primary    Uncontrolled  DM: Fasting glucose 160-755, post prandial glucose >200 Admits to high carb/sugar diet. Denies need for nutritionist referral. States she is aware of necessary dietary modifications needed. Current use of novolog SSI with meals, Toujeo 30units at HS and metformin 500mg  BID. Denies any hypoglycemia.  POCT HgbA1c at 8.2% today. We discussed use of ozempic injections: dosing, and possible side effects. She verbalized understanding and agreed to start injection. Ozempic Rx sent Also sent Freestyle Libre CGM to minimize multiple finger sticks. Maintain other medication doses F/up in 39month      Relevant Medications   Continuous Blood Gluc Sensor (FREESTYLE LIBRE 14 DAY SENSOR) MISC   Continuous Blood Gluc Receiver (FREESTYLE LIBRE READER) DEVI   Semaglutide,0.25 or 0.5MG /DOS, (OZEMPIC, 0.25 OR 0.5 MG/DOSE,) 2 MG/1.5ML SOPN   Insulin Pen Needle (PEN NEEDLES) 31G X 6 MM MISC   Other Relevant Orders   Microalbumin / creatinine urine ratio   POCT HgB A1C (Completed)   Other Visit Diagnoses     Breast cancer screening by mammogram       Relevant Orders   MM 3D SCREEN BREAST BILATERAL  Need for pneumococcal vaccine       Relevant Orders   Pneumococcal conjugate vaccine 20-valent (Prevnar 20) (Completed)       Follow-up: Return in about 4 weeks (around 06/21/2021) for DM.  Alysia Penna, NP

## 2021-05-25 NOTE — Telephone Encounter (Signed)
Pt states her call was not regarding birth control. Pt states she was calling regarding her glucose meter. Pt states her insurance will only cover Dexcom G6. She also needed a PA for the ozempic she was prescribed. Informed patient we would work on PA and inform the provider about the change in glucose meter needed.   Called pt pharmacy to give a verbal for glucose meter change, was unable to get through.

## 2021-05-26 ENCOUNTER — Telehealth: Payer: Self-pay

## 2021-05-26 MED ORDER — DEXCOM G6 RECEIVER DEVI: 1.0000 [IU] | Freq: Once | 0 refills | Status: AC

## 2021-05-26 MED ORDER — DEXCOM G6 SENSOR MISC: 11 refills | Status: DC

## 2021-05-26 MED ORDER — DEXCOM G6 TRANSMITTER MISC: 3 refills | Status: DC

## 2021-05-26 NOTE — Telephone Encounter (Signed)
Spoke to patient. All issues resolved. Sw, cma 

## 2021-05-26 NOTE — Telephone Encounter (Addendum)
Can you call the patient and let her know that her labs were reviewed. The antibodies were mildly elevated but not high enough for diagnosis of antiphospholipid syndrome. So recommend to continue with Lovenox. Additionally, I ordered a CT venogram. Can you help me get that scheduled?   Beverly: Please schedule RTC in 3 months with labs.   Thanks,  Karena Addison   05/26/21 Pt advised of lab results.  Number to central scheduling given to pt to call to schedule CT venogram. Pt was also advised scheduling will call her to schedule a 3 mo f/up with labs.  Pt with VU.  *CT venogram scheduled for 06/02/21 at 8am

## 2021-05-26 NOTE — Telephone Encounter (Signed)
Called and spoke to rep from [employer] [facility] [place] department. PA started for Ozempic (semaglutide 0.25/.05mg ). TAT 3-7 days for final decision. Pt will be notified of update. PA case [id number]

## 2021-05-26 NOTE — Addendum Note (Signed)
Addended by: Alysia Penna L on: 05/26/2021 12:41 PM   Modules accepted: Orders

## 2021-05-27 ENCOUNTER — Telehealth: Payer: Self-pay | Admitting: Physician Assistant

## 2021-05-27 NOTE — Telephone Encounter (Signed)
Sch per 1/13 inbasket, pt aware °

## 2021-05-27 NOTE — Telephone Encounter (Signed)
PA for Dexcome G6 and sensors approved through [employer] form 05/26/21 - 05/26/2022.  Pharmacy notified VIA phone.  Dm/cma

## 2021-05-27 NOTE — Telephone Encounter (Signed)
PA approved through [employer] from 05/26/21 -05/26/22. Pharmacy notified VIA phone. Dm/cma

## 2021-05-31 ENCOUNTER — Other Ambulatory Visit: Payer: Self-pay | Admitting: Nurse Practitioner

## 2021-05-31 DIAGNOSIS — Z1231 Encounter for screening mammogram for malignant neoplasm of breast: Secondary | ICD-10-CM

## 2021-06-01 ENCOUNTER — Other Ambulatory Visit: Payer: Self-pay

## 2021-06-01 ENCOUNTER — Other Ambulatory Visit: Payer: Self-pay | Admitting: Nurse Practitioner

## 2021-06-01 ENCOUNTER — Ambulatory Visit (INDEPENDENT_AMBULATORY_CARE_PROVIDER_SITE_OTHER): Payer: 59

## 2021-06-01 DIAGNOSIS — Z1231 Encounter for screening mammogram for malignant neoplasm of breast: Secondary | ICD-10-CM | POA: Diagnosis not present

## 2021-06-01 NOTE — Telephone Encounter (Signed)
Patient notified and verbalized understanding. 

## 2021-06-02 ENCOUNTER — Other Ambulatory Visit: Payer: Self-pay | Admitting: Physician Assistant

## 2021-06-02 ENCOUNTER — Ambulatory Visit (HOSPITAL_COMMUNITY): Payer: 59

## 2021-06-02 MED ORDER — ENOXAPARIN SODIUM 120 MG/0.8ML IJ SOSY: 110.0000 mg | PREFILLED_SYRINGE | Freq: Two times a day (BID) | INTRAMUSCULAR | 3 refills | Status: DC

## 2021-06-03 ENCOUNTER — Telehealth: Payer: Self-pay

## 2021-06-03 NOTE — Telephone Encounter (Signed)
Patient notified of Prior Authorization approval for Enoxaparin 120mg  Injection. Medication is approved through 06/02/2022

## 2021-06-07 ENCOUNTER — Encounter: Payer: Self-pay | Admitting: Physician Assistant

## 2021-06-07 NOTE — Progress Notes (Signed)
Received message from nurse regarding assistance with Lovenox.  Application for [employer] Patient Connection for Lovenox given to [place] to have provider complete their portion and patient complete her portion to submit completed application to foundation directly via mail or fax.

## 2021-06-09 ENCOUNTER — Other Ambulatory Visit: Payer: Self-pay | Admitting: Physician Assistant

## 2021-06-09 MED ORDER — ENOXAPARIN SODIUM 120 MG/0.8ML IJ SOSY
110.0000 mg | PREFILLED_SYRINGE | Freq: Two times a day (BID) | INTRAMUSCULAR | 0 refills | Status: DC
Start: 2021-06-09 — End: 2021-06-29

## 2021-06-09 MED ORDER — WARFARIN SODIUM 5 MG PO TABS: 5.0000 mg | ORAL_TABLET | Freq: Every day | ORAL | 0 refills | Status: DC

## 2021-06-09 NOTE — Progress Notes (Signed)
Myriam Jacobson called regarding this note and request for Claris Gower to keep up with pts INR levels..   Royann Shivers: [phone]

## 2021-06-09 NOTE — Progress Notes (Signed)
Patient reached out to our clinic stating she is unable to afford Lovenox and she does not qualify for the patient assistance program. We will switch her to Warfarin with a starting dose of 5 mg once daily. She will need to bridge with Lovenox for 7 days.   We will reach out to her PCP, Alysia Penna, to confirm she can follow the patient to check INR levels and modify doses as needed.

## 2021-06-10 ENCOUNTER — Telehealth: Payer: Self-pay

## 2021-06-10 NOTE — Telephone Encounter (Signed)
Shannon Oneal said she called yesterday to see if Shannon Oneal would be willing to monitor patients INR. I don't see a message from yesterday. Please advise if Shannon Oneal is willing to do this.  Thank you!

## 2021-06-13 ENCOUNTER — Ambulatory Visit (INDEPENDENT_AMBULATORY_CARE_PROVIDER_SITE_OTHER): Payer: 59 | Admitting: Physician Assistant

## 2021-06-13 ENCOUNTER — Ambulatory Visit (HOSPITAL_COMMUNITY)
Admission: RE | Admit: 2021-06-13 | Discharge: 2021-06-13 | Disposition: A | Discharge status: Home / Self Care | Payer: 59 | Source: Ambulatory Visit | Attending: Vascular Surgery | Admitting: Vascular Surgery

## 2021-06-13 ENCOUNTER — Other Ambulatory Visit: Payer: Self-pay

## 2021-06-13 VITALS — BP 134/82 | HR 82 | Temp 97.5°F | Resp 20 | Ht 66.0 in | Wt 235.3 lb

## 2021-06-13 DIAGNOSIS — I87001 Postthrombotic syndrome without complications of right lower extremity: Secondary | ICD-10-CM | POA: Diagnosis not present

## 2021-06-13 DIAGNOSIS — I82421 Acute embolism and thrombosis of right iliac vein: Secondary | ICD-10-CM | POA: Insufficient documentation

## 2021-06-13 NOTE — Progress Notes (Signed)
Office Note     CC:  follow up Requesting Provider:  Anne Ng, NP  HPI: Shannon Oneal is a 40 y.o. (05-14-1981) female who presents for follow-up of right lower extremity DVT.  She was initially diagnosed with a distal femoral vein and popliteal vein DVT of the right lower extremity as well as PE.  She was started on Xarelto.  She returned to the hospital and October and was found to have new DVT involving PT and peroneal veins.  She was switched to Lovenox and is being followed by hematology.  She currently has plans to transition to Coumadin.  Patient states her right leg remains painful and edematous without any noticeable improvement.  She states she is wearing her knee-high compression however there are easy to get on.  She is elevating her leg periodically during the day.  She denies any venous ulcerations or any concerns for cellulitis.  She denies tobacco use.  It should also be noted that her hematologist has scheduled a CT venogram this week to evaluate for May Thurner syndrome.   Past Medical History:  Diagnosis Date   Acute deep vein thrombosis (DVT) of right lower extremity (HCC) 01/18/2021   Acute pulmonary embolism (HCC) 01/19/2021   Asthma    Cellulitis in diabetic foot (HCC)    Diabetes mellitus without complication (HCC)    DVT (deep venous thrombosis) (HCC) 02/05/2021   PE (pulmonary thromboembolism) (HCC) 2016    Past Surgical History:  Procedure Laterality Date   adenoids     CESAREAN SECTION      Social History   Socioeconomic History   Marital status: Married    Spouse name: Not on file   Number of children: Not on file   Years of education: Not on file   Highest education level: Not on file  Occupational History   Not on file  Tobacco Use   Smoking status: Never   Smokeless tobacco: Never  Vaping Use   Vaping Use: Former   Substances: CBD  Substance and Sexual Activity   Alcohol use: Yes   Drug use: Never   Sexual activity: Not on file   Other Topics Concern   Not on file  Social History Narrative   Not on file   Social Determinants of Health   Financial Resource Strain: Not on file  Food Insecurity: Not on file  Transportation Needs: Not on file  Physical Activity: Not on file  Stress: Not on file  Social Connections: Not on file  Intimate Partner Violence: Not on file    Family History  Problem Relation Age of Onset   Deep vein thrombosis Father    Breast cancer Sister 64    Current Outpatient Medications  Medication Sig Dispense Refill   acetaminophen (TYLENOL) 325 MG tablet Take 2 tablets (650 mg total) by mouth every 6 (six) hours as needed for mild pain, fever or headache. 30 tablet 0   Continuous Blood Gluc Sensor (DEXCOM G6 SENSOR) MISC Replace sensor every 10days. Check glucose before meals and at bedtime 3 each 11   Continuous Blood Gluc Transmit (DEXCOM G6 TRANSMITTER) MISC Change transmitter every 90days. Check glucose before meals and at bedtime 1 each 3   enoxaparin (LOVENOX) 120 MG/0.8ML injection Inject 0.74 mLs (110 mg total) into the skin every 12 (twelve) hours for 7 days. 10.36 mL 0   insulin aspart (NOVOLOG FLEXPEN) 100 UNIT/ML FlexPen Inject 1-10 Units into the skin 3 (three) times daily with meals. Per sliding  scale     Insulin Pen Needle (PEN NEEDLES) 31G X 6 MM MISC 1 application by Does not apply route once a week. For ozempic injection 30 each 2   metFORMIN (GLUCOPHAGE) 500 MG tablet Take 1 tablet (500 mg total) by mouth 2 (two) times daily with a meal. 180 tablet 1   morphine (MSIR) 15 MG tablet Take 1 tablet (15 mg total) by mouth every 6 (six) hours as needed for severe pain or moderate pain. 60 tablet 0   Semaglutide,0.25 or 0.5MG /DOS, (OZEMPIC, 0.25 OR 0.5 MG/DOSE,) 2 MG/1.5ML SOPN 0.25mg  weekly x 2weeks, then 0.5weekly continuously 1.5 mL 5   sennosides-docusate sodium (SENOKOT-S) 8.6-50 MG tablet Take 1 tablet by mouth daily. 90 tablet 3   TOUJEO SOLOSTAR 300 UNIT/ML Solostar Pen  Inject 30 Units into the skin daily. 1.5 mL 3   warfarin (COUMADIN) 5 MG tablet Take 1 tablet (5 mg total) by mouth daily. 30 tablet 0   No current facility-administered medications for this visit.    Allergies  Allergen Reactions   Amoxapine And Related Other (See Comments)    Burns skin, hair falls out   Amoxicillin Other (See Comments)    Burns skin and hair falls out   Oxycodone Itching   Percocet [Oxycodone-Acetaminophen] Itching   Rocephin [Ceftriaxone] Rash    swelling   Tramadol Itching     REVIEW OF SYSTEMS:   [X]  denotes positive finding, [ ]  denotes negative finding Cardiac  Comments:  Chest pain or chest pressure:    Shortness of breath upon exertion:    Short of breath when lying flat:    Irregular heart rhythm:        Vascular    Pain in calf, thigh, or hip brought on by ambulation:    Pain in feet at night that wakes you up from your sleep:     Blood clot in your veins:    Leg swelling:         Pulmonary    Oxygen at home:    Productive cough:     Wheezing:         Neurologic    Sudden weakness in arms or legs:     Sudden numbness in arms or legs:     Sudden onset of difficulty speaking or slurred speech:    Temporary loss of vision in one eye:     Problems with dizziness:         Gastrointestinal    Blood in stool:     Vomited blood:         Genitourinary    Burning when urinating:     Blood in urine:        Psychiatric    Major depression:         Hematologic    Bleeding problems:    Problems with blood clotting too easily:        Skin    Rashes or ulcers:        Constitutional    Fever or chills:      PHYSICAL EXAMINATION:  Vitals:   06/13/21 1146  BP: 134/82  Pulse: 82  Resp: 20  Temp: (!) 97.5 F (36.4 C)  TempSrc: Temporal  SpO2: 99%  Weight: 235 lb 4.8 oz (106.7 kg)  Height: 5\' 6"  (1.676 m)    General:  WDWN in NAD; vital signs documented above Gait: Not observed HENT: WNL, normocephalic Pulmonary: normal  non-labored breathing Cardiac: regular HR Abdomen: soft, NT,  no masses Skin: without rashes Vascular Exam/Pulses:  Right Left  Radial 2+ (normal) 2+ (normal)  DP 2+ (normal) 2+ (normal)   Extremities: Pitting edema around the level of the ankle of the right lower extremity Musculoskeletal: no muscle wasting or atrophy  Neurologic: A&O X 3;  No focal weakness or paresthesias are detected Psychiatric:  The pt has Normal affect.   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 female here for follow up for right lower extremity DVT  -Subjectively, patient does not notice any improvement with pain or edema -Right lower extremity venous study demonstrates recannulized flow of the distal femoral vein suggesting improvement since prior study -Patient has been alternating knee-high compression and thigh-high compression.  Recommended patient wear the thigh-high compression 20 to 30 mmHg regularly.  We also reviewed elevation of her leg above the level of her heart periodically during the day.  She should also avoid prolonged sitting and standing. -Dr. Karin Lieu was also involved in the evaluation management plan of this patient.  He also discussed with the patient that even if there is a narrowing in her outflow veins, he would not offer any intervention given that the clot only extended to the level of the distal femoral vein.  It will take time for her to to develop collateral flow however she should remain active and continue compression, elevation, and avoiding prolonged sitting and standing.  We will defer hypercoagulable panel and choice of anticoagulation to her hematologist. -Patient will follow up on an as-needed basis   Emilie Rutter, PA-C [facility] [phone]  Clinic MD:   Karin Lieu

## 2021-06-14 ENCOUNTER — Ambulatory Visit (INDEPENDENT_AMBULATORY_CARE_PROVIDER_SITE_OTHER): Payer: 59

## 2021-06-14 DIAGNOSIS — I82401 Acute embolism and thrombosis of unspecified deep veins of right lower extremity: Secondary | ICD-10-CM

## 2021-06-14 DIAGNOSIS — I2699 Other pulmonary embolism without acute cor pulmonale: Secondary | ICD-10-CM | POA: Diagnosis not present

## 2021-06-14 LAB — POCT INR: INR: 1.1 — AB (ref 2.0–3.0)

## 2021-06-14 MED ORDER — WARFARIN SODIUM 7.5 MG PO TABS: 7.5000 mg | ORAL_TABLET | Freq: Every day | ORAL | 0 refills | Status: DC

## 2021-06-14 NOTE — Addendum Note (Signed)
Addended by: Alysia Penna L on: 06/14/2021 03:09 PM   Modules accepted: Orders

## 2021-06-14 NOTE — Progress Notes (Signed)
Pt is here for INR check. Capillary stick performed on right index finger. Pt tolerated stick well.   Last INR reading: 1.0 INR reading today: 1.1 INR goal: n/a  Medication: Coumadin (Warfarin) 5mg  QD Diet changes: No   INR goal varies based on device and disease states.

## 2021-06-14 NOTE — Progress Notes (Addendum)
Increase coumadin to 7.5mg  daily. Schedule nurse vis for repeat INR in 1week.

## 2021-06-15 ENCOUNTER — Ambulatory Visit (HOSPITAL_COMMUNITY)
Admission: RE | Admit: 2021-06-15 | Discharge: 2021-06-15 | Disposition: A | Discharge status: Home / Self Care | Payer: 59 | Source: Ambulatory Visit | Attending: Physician Assistant | Admitting: Physician Assistant

## 2021-06-15 ENCOUNTER — Other Ambulatory Visit: Payer: Self-pay

## 2021-06-15 ENCOUNTER — Encounter (HOSPITAL_COMMUNITY): Payer: Self-pay

## 2021-06-15 DIAGNOSIS — I82401 Acute embolism and thrombosis of unspecified deep veins of right lower extremity: Secondary | ICD-10-CM | POA: Diagnosis present

## 2021-06-15 MED ORDER — SODIUM CHLORIDE (PF) 0.9 % IJ SOLN
INTRAMUSCULAR | Status: AC
  Filled 2021-06-15: qty 50

## 2021-06-15 MED ORDER — IOHEXOL 350 MG/ML SOLN
100.0000 mL | Freq: Once | INTRAVENOUS | Status: AC | PRN
  Administered 2021-06-15: 100 mL via INTRAVENOUS

## 2021-06-21 ENCOUNTER — Ambulatory Visit (INDEPENDENT_AMBULATORY_CARE_PROVIDER_SITE_OTHER): Payer: 59

## 2021-06-21 ENCOUNTER — Other Ambulatory Visit: Payer: Self-pay

## 2021-06-21 DIAGNOSIS — I2699 Other pulmonary embolism without acute cor pulmonale: Secondary | ICD-10-CM | POA: Diagnosis not present

## 2021-06-21 LAB — POCT INR: INR: 1.9 — AB (ref 2.0–3.0)

## 2021-06-21 NOTE — Progress Notes (Signed)
Pt here for INR check per Alysia Penna, NP  Goal INR =  Last INR =1.1  Pt currently takes Coumadin 7.5 mg daily  Date of last Coumadin dose = 06/14/21  Pt denies recent antibiotics, no dietary changes and no unusual bruising / bleeding.  INR today = 1.9  Pt advised per Ian Bushman, cma , to take 7.5 mg Coumadin on Monday -Thursday and 15 mg Friday - Sunday.  Return in 1 week for a re-check.

## 2021-06-28 ENCOUNTER — Ambulatory Visit (INDEPENDENT_AMBULATORY_CARE_PROVIDER_SITE_OTHER): Payer: 59

## 2021-06-28 ENCOUNTER — Other Ambulatory Visit: Payer: Self-pay

## 2021-06-28 DIAGNOSIS — I2699 Other pulmonary embolism without acute cor pulmonale: Secondary | ICD-10-CM | POA: Diagnosis not present

## 2021-06-28 DIAGNOSIS — I82401 Acute embolism and thrombosis of unspecified deep veins of right lower extremity: Secondary | ICD-10-CM

## 2021-06-28 LAB — PROTIME-INR
INR: 3.4 ratio — ABNORMAL HIGH (ref 0.8–1.0)
Prothrombin Time: 34.5 s — ABNORMAL HIGH (ref 9.6–13.1)

## 2021-06-28 MED ORDER — WARFARIN SODIUM 7.5 MG PO TABS: ORAL_TABLET | ORAL | 0 refills | Status: DC

## 2021-06-28 NOTE — Progress Notes (Signed)
Per orders of Shannon Oneal pt is here for INR check, pt has received blood draw for pt tolerated draw well.

## 2021-06-28 NOTE — Addendum Note (Signed)
Addended by: Alysia Penna L on: 06/28/2021 01:53 PM   Modules accepted: Orders

## 2021-06-29 ENCOUNTER — Encounter: Payer: Self-pay | Admitting: Nurse Practitioner

## 2021-06-29 ENCOUNTER — Ambulatory Visit (INDEPENDENT_AMBULATORY_CARE_PROVIDER_SITE_OTHER): Payer: 59 | Admitting: Nurse Practitioner

## 2021-06-29 VITALS — BP 114/80 | HR 88 | Temp 97.1°F | Ht 66.0 in | Wt 242.2 lb

## 2021-06-29 DIAGNOSIS — E1141 Type 2 diabetes mellitus with diabetic mononeuropathy: Secondary | ICD-10-CM

## 2021-06-29 DIAGNOSIS — Z794 Long term (current) use of insulin: Secondary | ICD-10-CM | POA: Diagnosis not present

## 2021-06-29 DIAGNOSIS — E1165 Type 2 diabetes mellitus with hyperglycemia: Secondary | ICD-10-CM | POA: Diagnosis not present

## 2021-06-29 MED ORDER — METFORMIN HCL ER 750 MG PO TB24: 750.0000 mg | ORAL_TABLET | Freq: Every day | ORAL | 1 refills | Status: DC

## 2021-06-29 MED ORDER — NOVOLOG FLEXPEN 100 UNIT/ML ~~LOC~~ SOPN: 1.0000 [IU] | PEN_INJECTOR | Freq: Three times a day (TID) | SUBCUTANEOUS | 1 refills | Status: DC

## 2021-06-29 MED ORDER — TOUJEO SOLOSTAR 300 UNIT/ML ~~LOC~~ SOPN: 30.0000 [IU] | PEN_INJECTOR | Freq: Every day | SUBCUTANEOUS | 3 refills | Status: DC

## 2021-06-29 MED ORDER — GABAPENTIN 100 MG PO CAPS: ORAL_CAPSULE | ORAL | 5 refills | Status: DC

## 2021-06-29 NOTE — Patient Instructions (Addendum)
Victoza or trulicity or Mounjaro or wegovy : ask insurance if this is covered.  Monitor glucose 3x/day before meals and at bedtime. Bring readings to next office visit.  Resume gabapentin as discussed  Schedule appt for nurse visit in 2weeks.  Switched metformin 500mg  BID  to 750mg  once a day

## 2021-06-29 NOTE — Assessment & Plan Note (Addendum)
Worsening neuropathy, d/c morphine due to lack of effectiveness, constipation and itching. Previous use of elavil, caused excessive daytime somnolence. Agreed to try gabapentin again. rx sent Provided titration instructions F/up in 73month

## 2021-06-29 NOTE — Progress Notes (Signed)
Subjective:  Patient ID: Shannon Oneal, female    DOB: [birth year]  Age: 40 y.o. MRN: [id number]  CC: Follow-up (4 week f/u on DM. Pt states her blood sugars when she does check them have still been running high in 150s and higher. Pt states she will be picking up her Dexcom today.)  HPI  Diabetic neuropathy (HCC)  Worsening neuropathy, d/c morphine due to lack of effectiveness, constipation and itching. Previous use of elavil, caused excessive daytime somnolence. Agreed to try gabapentin again. rx sent Provided titration instructions F/up in 79month  Type 2 diabetes mellitus with hyperglycemia (HCC) No glucose reading, due to lack of dexcom monitor. She plans to pick device from pharmacy today. Unable to afford ozempic due to high copay Daily diarrhea with metformin 500mg  BID Switched metformin dose to 750mg  XR daily Continue current medications: novolog SSI, toujeo and metformin Advised to check with insurance if the following meds are covered: trulicity, victoza, wegovy or mounjaro? Unable to afford ozempic  Wt Readings from Last 3 Encounters:  06/29/21 242 lb 3.2 oz (109.9 kg)  06/13/21 235 lb 4.8 oz (106.7 kg)  05/24/21 235 lb (106.6 kg)    BP Readings from Last 3 Encounters:  06/29/21 114/80  06/13/21 134/82  05/24/21 102/60    Reviewed past Medical, Social and Family history today.  Outpatient Medications Prior to Visit  Medication Sig Dispense Refill   acetaminophen (TYLENOL) 325 MG tablet Take 2 tablets (650 mg total) by mouth every 6 (six) hours as needed for mild pain, fever or headache. 30 tablet 0   Continuous Blood Gluc Sensor (DEXCOM G6 SENSOR) MISC Replace sensor every 10days. Check glucose before meals and at bedtime 3 each 11   Continuous Blood Gluc Transmit (DEXCOM G6 TRANSMITTER) MISC Change transmitter every 90days. Check glucose before meals and at bedtime 1 each 3   Insulin Pen Needle (PEN NEEDLES) 31G X 6 MM MISC 1 application by Does not apply route  once a week. For ozempic injection 30 each 2   morphine (MSIR) 15 MG tablet Take 1 tablet (15 mg total) by mouth every 6 (six) hours as needed for severe pain or moderate pain. 60 tablet 0   sennosides-docusate sodium (SENOKOT-S) 8.6-50 MG tablet Take 1 tablet by mouth daily. 90 tablet 3   warfarin (COUMADIN) 7.5 MG tablet 7.5mg  Mon-Fri and 15mg  sat-sun. 60 tablet 0   insulin aspart (NOVOLOG FLEXPEN) 100 UNIT/ML FlexPen Inject 1-10 Units into the skin 3 (three) times daily with meals. Per sliding scale     metFORMIN (GLUCOPHAGE) 500 MG tablet Take 1 tablet (500 mg total) by mouth 2 (two) times daily with a meal. 180 tablet 1   Semaglutide,0.25 or 0.5MG /DOS, (OZEMPIC, 0.25 OR 0.5 MG/DOSE,) 2 MG/1.5ML SOPN 0.25mg  weekly x 2weeks, then 0.5weekly continuously 1.5 mL 5   enoxaparin (LOVENOX) 120 MG/0.8ML injection Inject 0.74 mLs (110 mg total) into the skin every 12 (twelve) hours for 7 days. 10.36 mL 0   TOUJEO SOLOSTAR 300 UNIT/ML Solostar Pen Inject 30 Units into the skin daily. 1.5 mL 3   No facility-administered medications prior to visit.    ROS See HPI  Objective:  BP 114/80 (BP Location: Left Arm, Patient Position: Sitting, Cuff Size: Large)   Pulse 88   Temp (!) 97.1 F (36.2 C) (Temporal)   Ht 5\' 6"  (1.676 m)   Wt 242 lb 3.2 oz (109.9 kg)   LMP 06/11/2021   SpO2 98%   BMI 39.09 kg/m   Physical  Exam Constitutional:      General: She is not in acute distress. Cardiovascular:     Rate and Rhythm: Normal rate.     Pulses: Normal pulses.  Pulmonary:     Effort: Pulmonary effort is normal.     Breath sounds: Normal breath sounds.  Musculoskeletal:     Right lower leg: Edema present.     Left lower leg: No edema.  Skin:    Findings: No erythema.  Neurological:     Mental Status: She is alert and oriented to person, place, and time.   Assessment & Plan:  This visit occurred during the SARS-CoV-2 public health emergency.  Safety protocols were in place, including screening  questions prior to the visit, additional usage of staff PPE, and extensive cleaning of exam room while observing appropriate contact time as indicated for disinfecting solutions.   Shannon Oneal was seen today for follow-up.  Diagnoses and all orders for this visit:  Type 2 diabetes mellitus with hyperglycemia, with long-term current use of insulin (HCC) -     metFORMIN (GLUCOPHAGE-XR) 750 MG 24 hr tablet; Take 1 tablet (750 mg total) by mouth daily with breakfast. -     TOUJEO SOLOSTAR 300 UNIT/ML Solostar Pen; Inject 30 Units into the skin daily. -     insulin aspart (NOVOLOG FLEXPEN) 100 UNIT/ML FlexPen; Inject 1-10 Units into the skin 3 (three) times daily with meals. Insulin (Novolog) sliding scale for Novolog: administer before meals. Sugar <150 - no additional units Sugar <151-200 - 2 units Sugar >200-250 - 6 units Sugar >251-300 - 8 units Sugar >351- 10 units and call office.  Diabetic mononeuropathy associated with type 2 diabetes mellitus (HCC) -     gabapentin (NEURONTIN) 100 MG capsule; Take 1cap at bedtime, 3days, then 1cap BID x 1week, then 1cap TID continuous.   Problem List Items Addressed This Visit       Endocrine   Diabetic neuropathy (HCC)     Worsening neuropathy, d/c morphine due to lack of effectiveness, constipation and itching. Previous use of elavil, caused excessive daytime somnolence. Agreed to try gabapentin again. rx sent Provided titration instructions F/up in 53month      Relevant Medications   metFORMIN (GLUCOPHAGE-XR) 750 MG 24 hr tablet   gabapentin (NEURONTIN) 100 MG capsule   TOUJEO SOLOSTAR 300 UNIT/ML Solostar Pen   insulin aspart (NOVOLOG FLEXPEN) 100 UNIT/ML FlexPen   Type 2 diabetes mellitus with hyperglycemia (HCC) - Primary    No glucose reading, due to lack of dexcom monitor. She plans to pick device from pharmacy today. Unable to afford ozempic due to high copay Daily diarrhea with metformin 500mg  BID Switched metformin dose to  750mg  XR daily Continue current medications: novolog SSI, toujeo and metformin Advised to check with insurance if the following meds are covered: trulicity, victoza, wegovy or mounjaro?      Relevant Medications   metFORMIN (GLUCOPHAGE-XR) 750 MG 24 hr tablet   TOUJEO SOLOSTAR 300 UNIT/ML Solostar Pen   insulin aspart (NOVOLOG FLEXPEN) 100 UNIT/ML FlexPen    Follow-up: Return in about 4 weeks (around 07/27/2021) for DM.  , NP

## 2021-06-29 NOTE — Assessment & Plan Note (Addendum)
No glucose reading, due to lack of dexcom monitor. She plans to pick device from pharmacy today. Unable to afford ozempic due to high copay Daily diarrhea with metformin 500mg  BID Switched metformin dose to 750mg  XR daily Continue current medications: novolog SSI, toujeo and metformin Advised to check with insurance if the following meds are covered: trulicity, victoza, wegovy or mounjaro?

## 2021-07-12 ENCOUNTER — Ambulatory Visit (INDEPENDENT_AMBULATORY_CARE_PROVIDER_SITE_OTHER): Payer: 59

## 2021-07-12 ENCOUNTER — Other Ambulatory Visit: Payer: Self-pay

## 2021-07-12 ENCOUNTER — Other Ambulatory Visit: Payer: Self-pay | Admitting: Nurse Practitioner

## 2021-07-12 DIAGNOSIS — I82401 Acute embolism and thrombosis of unspecified deep veins of right lower extremity: Secondary | ICD-10-CM

## 2021-07-12 DIAGNOSIS — I2699 Other pulmonary embolism without acute cor pulmonale: Secondary | ICD-10-CM

## 2021-07-12 LAB — POCT INR: INR: 2.3 (ref 2.0–3.0)

## 2021-07-12 NOTE — Progress Notes (Signed)
Pt here for INR check per Alysia Penna, NP.  Goal INR = 2.0-3.0  Last INR = 3.4  Pt currently takes Coumadin   Date of last Coumadin dose = 07/11/2021  Pt denies recent antibiotics, no dietary changes and no unusual bruising / bleeding.  INR today = 2.3  Pt advised per Alysia Penna, NP to continue taking medicine as prescribed and everything stays the same. Pt to return in 2 wks for recheck.

## 2021-07-15 ENCOUNTER — Other Ambulatory Visit: Payer: Self-pay | Admitting: Nurse Practitioner

## 2021-07-15 DIAGNOSIS — I2699 Other pulmonary embolism without acute cor pulmonale: Secondary | ICD-10-CM

## 2021-07-15 DIAGNOSIS — I82401 Acute embolism and thrombosis of unspecified deep veins of right lower extremity: Secondary | ICD-10-CM

## 2021-07-26 ENCOUNTER — Ambulatory Visit: Payer: 59

## 2021-08-16 ENCOUNTER — Telehealth: Payer: Self-pay | Admitting: Nurse Practitioner

## 2021-08-16 ENCOUNTER — Ambulatory Visit: Payer: 59 | Admitting: Nurse Practitioner

## 2021-08-16 NOTE — Telephone Encounter (Signed)
Pt is a no show for her visit.  She sent this via mychart message--I need to cancel my appointment for this morning. I am not currently covered by insurance at the moment

## 2021-08-25 ENCOUNTER — Other Ambulatory Visit: Payer: Self-pay | Admitting: Physician Assistant

## 2021-08-26 ENCOUNTER — Inpatient Hospital Stay: Payer: 59 | Attending: Physician Assistant | Admitting: Physician Assistant

## 2021-08-26 ENCOUNTER — Inpatient Hospital Stay: Payer: 59

## 2021-11-07 ENCOUNTER — Other Ambulatory Visit: Payer: Self-pay | Admitting: Nurse Practitioner

## 2021-11-07 DIAGNOSIS — I82401 Acute embolism and thrombosis of unspecified deep veins of right lower extremity: Secondary | ICD-10-CM

## 2021-11-07 DIAGNOSIS — I2699 Other pulmonary embolism without acute cor pulmonale: Secondary | ICD-10-CM

## 2021-12-20 ENCOUNTER — Emergency Department (HOSPITAL_COMMUNITY): Payer: 59

## 2021-12-20 ENCOUNTER — Other Ambulatory Visit: Payer: Self-pay

## 2021-12-20 ENCOUNTER — Encounter (HOSPITAL_COMMUNITY): Payer: Self-pay

## 2021-12-20 ENCOUNTER — Emergency Department (HOSPITAL_COMMUNITY)
Admission: EM | Admit: 2021-12-20 | Discharge: 2021-12-20 | Disposition: A | Discharge status: Home / Self Care | Payer: 59 | Attending: Emergency Medicine | Admitting: Emergency Medicine

## 2021-12-20 DIAGNOSIS — M791 Myalgia, unspecified site: Secondary | ICD-10-CM | POA: Insufficient documentation

## 2021-12-20 DIAGNOSIS — Z79899 Other long term (current) drug therapy: Secondary | ICD-10-CM | POA: Insufficient documentation

## 2021-12-20 DIAGNOSIS — Z7901 Long term (current) use of anticoagulants: Secondary | ICD-10-CM | POA: Insufficient documentation

## 2021-12-20 DIAGNOSIS — Z7984 Long term (current) use of oral hypoglycemic drugs: Secondary | ICD-10-CM | POA: Insufficient documentation

## 2021-12-20 DIAGNOSIS — R079 Chest pain, unspecified: Secondary | ICD-10-CM | POA: Insufficient documentation

## 2021-12-20 DIAGNOSIS — J45909 Unspecified asthma, uncomplicated: Secondary | ICD-10-CM | POA: Insufficient documentation

## 2021-12-20 DIAGNOSIS — Z794 Long term (current) use of insulin: Secondary | ICD-10-CM | POA: Insufficient documentation

## 2021-12-20 DIAGNOSIS — E119 Type 2 diabetes mellitus without complications: Secondary | ICD-10-CM | POA: Insufficient documentation

## 2021-12-20 DIAGNOSIS — R112 Nausea with vomiting, unspecified: Secondary | ICD-10-CM | POA: Insufficient documentation

## 2021-12-20 DIAGNOSIS — R051 Acute cough: Secondary | ICD-10-CM | POA: Insufficient documentation

## 2021-12-20 DIAGNOSIS — R110 Nausea: Secondary | ICD-10-CM

## 2021-12-20 LAB — I-STAT BETA HCG BLOOD, ED (MC, WL, AP ONLY): I-stat hCG, quantitative: <5 m[IU]/mL (ref <5)

## 2021-12-20 LAB — BASIC METABOLIC PANEL
Anion gap: 6 (ref 5–15)
BUN: 10 mg/dL (ref 6–20)
CO2: 25 mmol/L (ref 22–32)
Calcium: 8.9 mg/dL (ref 8.9–10.3)
Chloride: 106 mmol/L (ref 98–111)
Creatinine, Ser: 0.76 mg/dL (ref 0.44–1.00)
GFR, Estimated: >60 mL/min (ref >60)
Glucose, Bld: 304 mg/dL — ABNORMAL HIGH (ref 70–99)
Potassium: 4 mmol/L (ref 3.5–5.1)
Sodium: 137 mmol/L (ref 135–145)

## 2021-12-20 LAB — PROTIME-INR
INR: 1.7 — ABNORMAL HIGH (ref 0.8–1.2)
Prothrombin Time: 19.9 seconds — ABNORMAL HIGH (ref 11.4–15.2)

## 2021-12-20 LAB — CBC
HCT: 46.7 % — ABNORMAL HIGH (ref 36.0–46.0)
Hemoglobin: 14.7 g/dL (ref 12.0–15.0)
MCH: 23.2 pg — ABNORMAL LOW (ref 26.0–34.0)
MCHC: 31.5 g/dL (ref 30.0–36.0)
MCV: 73.7 fL — ABNORMAL LOW (ref 80.0–100.0)
Platelets: 309 10*3/uL (ref 150–400)
RBC: 6.34 MIL/uL — ABNORMAL HIGH (ref 3.87–5.11)
RDW: 13.9 % (ref 11.5–15.5)
WBC: 6.8 10*3/uL (ref 4.0–10.5)
nRBC: 0 % (ref 0.0–0.2)

## 2021-12-20 LAB — TROPONIN I (HIGH SENSITIVITY): Troponin I (High Sensitivity): <2 ng/L (ref <18)

## 2021-12-20 LAB — D-DIMER, QUANTITATIVE: D-Dimer, Quant: 0.35 ug/mL-FEU (ref 0.00–0.50)

## 2021-12-20 MED ORDER — BENZONATATE 100 MG PO CAPS: 100.0000 mg | ORAL_CAPSULE | Freq: Three times a day (TID) | ORAL | 0 refills | Status: DC

## 2021-12-20 MED ORDER — ALBUTEROL SULFATE HFA 108 (90 BASE) MCG/ACT IN AERS
2.0000 | INHALATION_SPRAY | Freq: Once | RESPIRATORY_TRACT | Status: AC
  Administered 2021-12-20: 2 via RESPIRATORY_TRACT
  Filled 2021-12-20: qty 6.7

## 2021-12-20 NOTE — ED Notes (Signed)
Lab called to add on Protime- INR 

## 2021-12-20 NOTE — ED Provider Notes (Signed)
[facility]-EMERGENCY DEPT Provider Note   CSN: [id number] Arrival date & time: 12/20/21  2009     History  Chief Complaint  Patient presents with   Chest Pain   Shortness of Breath   Cough    Shannon Oneal is a 41 y.o. female.  Patient presents to the hospital complaining of cough, chest pain, nausea.  Patient states that on Friday she began to feel unwell.  On Saturday she had multiple episodes of nausea with vomiting.  She began to cough at that time.  She states that she is having chest pain when she coughs.  She describes the chest pain as burning.  She states the chest pain goes away when she stops coughing.  The pain is in the center of her chest.  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  Currently takes warfarin.  She denies abdominal pain, constipation, diarrhea.  Endorses cough, chest pain, mild shortness of breath, and intermittent nausea.  She does endorse body aches and chills that began on Saturday but have gone away.  Past medical history significant for asthma, PE, DVT, DM.  HPI     Home Medications Prior to Admission medications   Medication Sig Start Date End Date Taking? Authorizing Provider  benzonatate (TESSALON) 100 MG capsule Take 1 capsule (100 mg total) by mouth every 8 (eight) hours. 12/20/21  Yes Darrick GrinderMcCauley, Gayle Martinez B, PA-C  acetaminophen (TYLENOL) 325 MG tablet Take 2 tablets (650 mg total) by mouth every 6 (six) hours as needed for mild pain, fever or headache. 02/06/21   Azucena FallenLancaster, William C, MD  Continuous Blood Gluc Sensor (DEXCOM G6 SENSOR) MISC Replace sensor every 10days. Check glucose before meals and at bedtime 05/26/21   [name], Bonna Gainsharlotte Lum, NP  Continuous Blood Gluc Transmit (DEXCOM G6 TRANSMITTER) MISC Change transmitter every 90days. Check glucose before meals and at bedtime 05/26/21   [name], Bonna Gainsharlotte Lum, NP  gabapentin (NEURONTIN) 100 MG capsule Take 1cap at bedtime, 3days, then 1cap BID x 1week, then 1cap TID continuous. 06/29/21    [name], Bonna Gainsharlotte Lum, NP  insulin aspart (NOVOLOG FLEXPEN) 100 UNIT/ML FlexPen Inject 1-10 Units into the skin 3 (three) times daily with meals. Insulin (Novolog) sliding scale for Novolog: administer 5minutes before meals. Sugar <150 - no additional units Sugar <151-200 - 2 units Sugar >200-250 - 6 units Sugar >251-300 - 8 units Sugar >351- 10 units and call office. 06/29/21   [name], Bonna Gainsharlotte Lum, NP  Insulin Pen Needle (PEN NEEDLES) 31G X 6 MM MISC 1 application by Does not apply route once a week. For ozempic injection 05/24/21   [name], Bonna Gainsharlotte Lum, NP  metFORMIN (GLUCOPHAGE-XR) 750 MG 24 hr tablet Take 1 tablet (750 mg total) by mouth daily with breakfast. 06/29/21   [name], Bonna Gainsharlotte Lum, NP  morphine (MSIR) 15 MG tablet Take 1 tablet (15 mg total) by mouth every 6 (six) hours as needed for severe pain or moderate pain. 05/20/21   Briant Cedarhayil, Irene T, PA-C  sennosides-docusate sodium (SENOKOT-S) 8.6-50 MG tablet Take 1 tablet by mouth daily. 05/24/21   [name], Bonna Gainsharlotte Lum, NP  TOUJEO SOLOSTAR 300 UNIT/ML Solostar Pen Inject 30 Units into the skin daily. 06/29/21 09/27/21  [name], Bonna Gainsharlotte Lum, NP  warfarin (COUMADIN) 7.5 MG tablet TAKE 1 TABLET(7.5 MG) BY MOUTH DAILY 11/08/21   [name], Bonna Gainsharlotte Lum, NP  diphenhydrAMINE (BENADRYL) 25 mg capsule Take 1 capsule (25 mg total) by mouth every 6 (six) hours as needed for itching. Patient not taking: No  sig reported 01/22/21 02/05/21  Noralee Stain, DO  metoCLOPramide (REGLAN) 10 MG tablet Take 1 tablet (10 mg total) by mouth every 6 (six) hours as needed (Nausea or headache). Patient not taking: Reported on 10/12/2017 02/18/13 10/31/19  Dione Booze, MD  promethazine (PHENERGAN) 25 MG tablet Take 1 tablet (25 mg total) by mouth every 6 (six) hours as needed for nausea or vomiting (headache). Patient not taking: No sig reported 06/04/20 01/18/21  Charlynne Pander, MD      Allergies    Amoxapine and related, Amoxicillin, Oxycodone, Percocet [oxycodone-acetaminophen],  Rocephin [ceftriaxone], and Tramadol    Review of Systems   Review of Systems  Constitutional:  Negative for fever.  Respiratory:  Positive for cough and shortness of breath.   Cardiovascular:  Positive for chest pain.  Gastrointestinal:  Positive for nausea. Negative for abdominal pain, constipation, diarrhea and vomiting.  Genitourinary:  Negative for dysuria.   Physical Exam Updated Vital Signs BP (!) 125/103 (BP Location: Left Arm)   Pulse 88   Temp 97.9 F (36.6 C) (Oral)   Resp (!) 22   LMP 11/17/2021   SpO2 100%  Physical Exam Vitals and nursing note reviewed.  Constitutional:      General: She is not in acute distress.    Appearance: She is obese.  HENT:     Head: Normocephalic and atraumatic.  Eyes:     Extraocular Movements: Extraocular movements intact.  Cardiovascular:     Rate and Rhythm: Normal rate and regular rhythm.     Heart sounds: Normal heart sounds.  Pulmonary:     Effort: Pulmonary effort is normal. No respiratory distress.     Breath sounds: Wheezing and rhonchi present.     Comments: Mild wheezing noted with scattered rhonchi Chest:     Chest wall: No tenderness.  Abdominal:     Palpations: Abdomen is soft.     Tenderness: There is no abdominal tenderness.  Musculoskeletal:     Cervical back: Normal range of motion and neck supple.  Skin:    General: Skin is warm and dry.  Neurological:     Mental Status: She is alert.    ED Results / Procedures / Treatments   Labs (all labs ordered are listed, but only abnormal results are displayed) Labs Reviewed  BASIC METABOLIC PANEL - Abnormal; Notable for the following components:      Result Value   Glucose, Bld 304 (*)    All other components within normal limits  CBC - Abnormal; Notable for the following components:   RBC 6.34 (*)    HCT 46.7 (*)    MCV 73.7 (*)    MCH 23.2 (*)    All other components within normal limits  PROTIME-INR - Abnormal; Notable for the following components:    Prothrombin Time 19.9 (*)    INR 1.7 (*)    All other components within normal limits  D-DIMER, QUANTITATIVE  I-STAT BETA HCG BLOOD, ED ([facility], [facility], [facility] ONLY)  TROPONIN I (HIGH SENSITIVITY)  TROPONIN I (HIGH SENSITIVITY)    EKG None  Radiology DG Chest 2 View  Result Date: 12/20/2021 CLINICAL DATA:  chest pain EXAM: CHEST - 2 VIEW COMPARISON:  Chest x-ray 10/31/2019, CT chest 01/18/2021 FINDINGS: The heart and mediastinal contours are unchanged. No focal consolidation. No pulmonary edema. No pleural effusion. No pneumothorax. No acute osseous abnormality. IMPRESSION: No active cardiopulmonary disease. Electronically Signed   By: Tish Frederickson M.D.   On: 12/20/2021 20:35  Procedures Procedures    Medications Ordered in ED Medications  albuterol (VENTOLIN HFA) 108 (90 Base) MCG/ACT inhaler 2 puff (2 puffs Inhalation Given 12/20/21 2135)    ED Course/ Medical Decision Making/ A&P                           Medical Decision Making Amount and/or Complexity of Data Reviewed Labs: ordered. Radiology: ordered.   This patient presents to the ED for concern of chest pain, this involves an extensive number of treatment options, and is a complaint that carries with it a high risk of complications and morbidity.  The differential diagnosis includes ACS, pneumonia, musculoskeletal pain, and others   Co morbidities that complicate the patient evaluation  History of PE, asthma   Additional history obtained:   External records from outside source obtained and reviewed including family medicine notes documenting DVT and warfarin therapy   Lab Tests:  I Ordered, and personally interpreted labs.  The pertinent results include: PT 19.9, INR 1.7, i-STAT beta-hCG 3, initial troponin of less than 2, glucose 304, D-dimer 0.35   Imaging Studies ordered:  I ordered imaging studies including chest x-ray I independently visualized and interpreted imaging which showed no acute disease I  agree with the radiologist interpretation   Cardiac Monitoring: / EKG:  The patient was maintained on a cardiac monitor.  I personally viewed and interpreted the cardiac monitored which showed an underlying rhythm of: Sinus tach   Problem List / ED Course / Critical interventions / Medication management   I ordered medication including albuterol for wheezes Reevaluation of the patient after these medicines showed that the patient improved I have reviewed the patients home medicines and have made adjustments as needed   Social Determinants of Health:  No health insurance   Test / Admission - Considered:  Patient has a heart score of 2.  Troponin less than 2.  No ischemic changes on EKG.  ACS unlikely.  D-dimer 0.35.  Patient is currently on warfarin.  PE unlikely.  This is likely a viral illness with a possible exacerbation of asthma.  There was no sign of pneumonia on chest x-ray.  Plan to discharge the patient home with a prescription for [facility].  She may use over-the-counter medicine for symptom management otherwise.  Follow-up as needed  Final Clinical Impression(s) / ED Diagnoses Final diagnoses:  Acute cough  Asthma, unspecified asthma severity, unspecified whether complicated, unspecified whether persistent  Nausea  Chest pain, unspecified type    Rx / DC Orders ED Discharge Orders          Ordered    benzonatate (TESSALON) 100 MG capsule  Every 8 hours        12/20/21 2135              Pamala Duffel 12/20/21 2154    Gerhard Munch, MD 12/20/21 2236

## 2021-12-20 NOTE — ED Triage Notes (Signed)
Pt reports with chest pain, SHOB, and cough x 3 days. Pt states that it burns in her chest when she coughs. Pt reports having a hx of PE.

## 2021-12-20 NOTE — Discharge Instructions (Addendum)
You were seen today for chest pain with cough.  No signs of heart attack or pulmonary embolism on work-up today.  The pain seems to be directly related to the cough.  I have prescribed a medication for cough.  Seems like it could be a viral illness.  It will take some time for it to improve.  Follow-up as needed.

## 2022-01-12 ENCOUNTER — Emergency Department (HOSPITAL_BASED_OUTPATIENT_CLINIC_OR_DEPARTMENT_OTHER)
Admit: 2022-01-12 | Discharge: 2022-01-12 | Disposition: A | Discharge status: Home / Self Care | Payer: Self-pay | Attending: Emergency Medicine | Admitting: Emergency Medicine

## 2022-01-12 ENCOUNTER — Emergency Department (HOSPITAL_COMMUNITY): Payer: Self-pay

## 2022-01-12 ENCOUNTER — Emergency Department (HOSPITAL_COMMUNITY)
Admission: EM | Admit: 2022-01-12 | Discharge: 2022-01-12 | Disposition: A | Discharge status: Home / Self Care | Payer: Self-pay | Attending: Emergency Medicine | Admitting: Emergency Medicine

## 2022-01-12 ENCOUNTER — Encounter (HOSPITAL_COMMUNITY): Payer: Self-pay | Admitting: Emergency Medicine

## 2022-01-12 DIAGNOSIS — Z7984 Long term (current) use of oral hypoglycemic drugs: Secondary | ICD-10-CM | POA: Insufficient documentation

## 2022-01-12 DIAGNOSIS — I82431 Acute embolism and thrombosis of right popliteal vein: Secondary | ICD-10-CM

## 2022-01-12 DIAGNOSIS — J45909 Unspecified asthma, uncomplicated: Secondary | ICD-10-CM | POA: Insufficient documentation

## 2022-01-12 DIAGNOSIS — I82491 Acute embolism and thrombosis of other specified deep vein of right lower extremity: Secondary | ICD-10-CM | POA: Insufficient documentation

## 2022-01-12 DIAGNOSIS — M7989 Other specified soft tissue disorders: Secondary | ICD-10-CM

## 2022-01-12 DIAGNOSIS — R0602 Shortness of breath: Secondary | ICD-10-CM | POA: Insufficient documentation

## 2022-01-12 DIAGNOSIS — Z7901 Long term (current) use of anticoagulants: Secondary | ICD-10-CM | POA: Insufficient documentation

## 2022-01-12 DIAGNOSIS — Z20822 Contact with and (suspected) exposure to covid-19: Secondary | ICD-10-CM | POA: Insufficient documentation

## 2022-01-12 DIAGNOSIS — E119 Type 2 diabetes mellitus without complications: Secondary | ICD-10-CM | POA: Insufficient documentation

## 2022-01-12 DIAGNOSIS — Z794 Long term (current) use of insulin: Secondary | ICD-10-CM | POA: Insufficient documentation

## 2022-01-12 LAB — BASIC METABOLIC PANEL
Anion gap: 6 (ref 5–15)
BUN: 10 mg/dL (ref 6–20)
CO2: 24 mmol/L (ref 22–32)
Calcium: 8.9 mg/dL (ref 8.9–10.3)
Chloride: 102 mmol/L (ref 98–111)
Creatinine, Ser: 0.99 mg/dL (ref 0.44–1.00)
GFR, Estimated: >60 mL/min (ref >60)
Glucose, Bld: 407 mg/dL — ABNORMAL HIGH (ref 70–99)
Potassium: 4.2 mmol/L (ref 3.5–5.1)
Sodium: 132 mmol/L — ABNORMAL LOW (ref 135–145)

## 2022-01-12 LAB — RESP PANEL BY RT-PCR (FLU A&B, COVID) ARPGX2
Influenza A by PCR: NEGATIVE
Influenza B by PCR: NEGATIVE
SARS Coronavirus 2 by RT PCR: NEGATIVE

## 2022-01-12 LAB — TROPONIN I (HIGH SENSITIVITY)
Troponin I (High Sensitivity): 2 ng/L (ref <18)
Troponin I (High Sensitivity): <2 ng/L (ref <18)

## 2022-01-12 LAB — CBC
HCT: 46 % (ref 36.0–46.0)
Hemoglobin: 14.4 g/dL (ref 12.0–15.0)
MCH: 23.3 pg — ABNORMAL LOW (ref 26.0–34.0)
MCHC: 31.3 g/dL (ref 30.0–36.0)
MCV: 74.3 fL — ABNORMAL LOW (ref 80.0–100.0)
Platelets: 295 10*3/uL (ref 150–400)
RBC: 6.19 MIL/uL — ABNORMAL HIGH (ref 3.87–5.11)
RDW: 15 % (ref 11.5–15.5)
WBC: 7.3 10*3/uL (ref 4.0–10.5)
nRBC: 0 % (ref 0.0–0.2)

## 2022-01-12 LAB — I-STAT BETA HCG BLOOD, ED (MC, WL, AP ONLY): I-stat hCG, quantitative: <5 m[IU]/mL (ref <5)

## 2022-01-12 LAB — BRAIN NATRIURETIC PEPTIDE: B Natriuretic Peptide: 12.7 pg/mL (ref 0.0–100.0)

## 2022-01-12 LAB — PROTIME-INR
INR: 1.2 (ref 0.8–1.2)
Prothrombin Time: 14.8 seconds (ref 11.4–15.2)

## 2022-01-12 MED ORDER — APIXABAN (ELIQUIS) EDUCATION KIT FOR DVT/PE PATIENTS
PACK | Freq: Once | Status: AC
  Filled 2022-01-12: qty 1

## 2022-01-12 MED ORDER — HYDROCODONE-ACETAMINOPHEN 5-325 MG PO TABS: 1.0000 | ORAL_TABLET | Freq: Four times a day (QID) | ORAL | 0 refills | Status: DC | PRN

## 2022-01-12 MED ORDER — HYDROCODONE BIT-HOMATROP MBR 5-1.5 MG/5ML PO SOLN: 5.0000 mL | Freq: Four times a day (QID) | ORAL | 0 refills | Status: DC | PRN

## 2022-01-12 MED ORDER — APIXABAN (ELIQUIS) VTE STARTER PACK (10MG AND 5MG): ORAL_TABLET | ORAL | 0 refills | Status: DC

## 2022-01-12 MED ORDER — HYDROCODONE-ACETAMINOPHEN 5-325 MG PO TABS
1.0000 | ORAL_TABLET | Freq: Once | ORAL | Status: AC
  Administered 2022-01-12: 1 via ORAL
  Filled 2022-01-12: qty 1

## 2022-01-12 MED ORDER — IOHEXOL 350 MG/ML SOLN
80.0000 mL | Freq: Once | INTRAVENOUS | Status: AC | PRN
  Administered 2022-01-12: 80 mL via INTRAVENOUS

## 2022-01-12 MED ORDER — APIXABAN 5 MG PO TABS
10.0000 mg | ORAL_TABLET | Freq: Once | ORAL | Status: AC
  Administered 2022-01-12: 10 mg via ORAL
  Filled 2022-01-12: qty 2

## 2022-01-12 NOTE — ED Notes (Signed)
Ambulated pt, pt was stable, and states that she felt fine just slightly out of breathe.

## 2022-01-12 NOTE — Progress Notes (Addendum)
Bilateral LE venous duplex study completed. Notified MD Zamet of critical results. Please see CV Proc for preliminary results.  Magdalene River BS, RVT 01/12/2022 6:48 PM

## 2022-01-12 NOTE — ED Notes (Signed)
An After Visit Summary was printed and given to the patient. Discharge instructions given and no further questions at this time.  Pt leaving with friend.  

## 2022-01-12 NOTE — Discharge Instructions (Addendum)
Your work-up was overall reassuring from the standpoint of a serious cause for your shortness of breath.  CT scan of the chest, did not show blood clot in the lungs, pneumonia, or fluid around the lungs.  Ultrasound of your legs did show a blood clot.  We have started you on Eliquis.  Please stop taking your warfarin.  I have also sent in cough syrup which contains some narcotic medicine for your cough.  I have also sent in some pain medication.  Both of these are narcotic medications.  Try not to take both at the same time as they can make you drowsy.  Do not drive after taking these medications or operate heavy machinery.  If any worsening symptoms return to the emergency room otherwise follow-up with the clinic tomorrow.  Social work will be followed up with you to get you established with a primary care provider.  __________________________________________________________________________________________________________________  Information on my medicine - ELIQUIS (apixaban)  This medication education was reviewed with me or my healthcare representative as part of my discharge preparation.  The pharmacist that spoke with me during my hospital stay was:  Lynden Ang, [facility]  Why was Eliquis prescribed for you? Eliquis was prescribed to treat blood clots that may have been found in the veins of your legs (deep vein thrombosis) or in your lungs (pulmonary embolism) and to reduce the risk of them occurring again.  What do You need to know about Eliquis ? The starting dose is 10 mg (two 5 mg tablets) taken TWICE daily for the FIRST SEVEN (7) DAYS, then on  02/19/2022  the dose is reduced to ONE 5 mg tablet taken TWICE daily.  Eliquis may be taken with or without food.   Try to take the dose about the same time in the morning and in the evening. If you have difficulty swallowing the tablet whole please discuss with your pharmacist how to take the medication safely.  Take Eliquis exactly as  prescribed and DO NOT stop taking Eliquis without talking to the doctor who prescribed the medication.  Stopping may increase your risk of developing a new blood clot.  Refill your prescription before you run out.  After discharge, you should have regular check-up appointments with your healthcare provider that is prescribing your Eliquis.    What do you do if you miss a dose? If a dose of ELIQUIS is not taken at the scheduled time, take it as soon as possible on the same day and twice-daily administration should be resumed. The dose should not be doubled to make up for a missed dose.  Important Safety Information A possible side effect of Eliquis is bleeding. You should call your healthcare provider right away if you experience any of the following: Bleeding from an injury or your nose that does not stop. Unusual colored urine (red or dark brown) or unusual colored stools (red or black). Unusual bruising for unknown reasons. A serious fall or if you hit your head (even if there is no bleeding).  Some medicines may interact with Eliquis and might increase your risk of bleeding or clotting while on Eliquis. To help avoid this, consult your healthcare provider or pharmacist prior to using any new prescription or non-prescription medications, including herbals, vitamins, non-steroidal anti-inflammatory drugs (NSAIDs) and supplements.  This website has more information on Eliquis (apixaban): http://www.eliquis.com/eliquis/home

## 2022-01-12 NOTE — ED Triage Notes (Signed)
Patient here from home reporting sob x3 days. Warfarin - haven't had level check "in awhile" due to loss of insurance.

## 2022-01-12 NOTE — ED Provider Triage Note (Signed)
Emergency Medicine Provider Triage Evaluation Note  Shannon Oneal , a 41 y.o. female  was evaluated in triage.  Pt complains of sob. Endorse wet cough, and sob x 3 days.  No fever, or other cold sxs, no cp.  Hx of PE on coumadin.  Have been compliant with her coumadin but have not had level checked in quite a while.    Review of Systems  Positive: As above Negative: As above  Physical Exam  BP 121/76 (BP Location: Left Arm)   Pulse 83   Temp 97.9 F (36.6 C) (Oral)   Resp (!) 29   LMP 11/17/2021   SpO2 100%  Gen:   Awake, no distress   Resp:  Normal effort  MSK:   Moves extremities without difficulty  Other:    Medical Decision Making  Medically screening exam initiated at 3:07 PM.  Appropriate orders placed.  Shannon Oneal was informed that the remainder of the evaluation will be completed by another provider, this initial triage assessment does not replace that evaluation, and the importance of remaining in the ED until their evaluation is complete.     Fayrene Helper, PA-C 01/12/22 1511

## 2022-01-13 ENCOUNTER — Telehealth: Payer: Self-pay

## 2022-01-14 ENCOUNTER — Other Ambulatory Visit (HOSPITAL_COMMUNITY): Payer: Self-pay

## 2022-01-26 ENCOUNTER — Ambulatory Visit (INDEPENDENT_AMBULATORY_CARE_PROVIDER_SITE_OTHER): Payer: Self-pay | Admitting: Nurse Practitioner

## 2022-01-26 ENCOUNTER — Other Ambulatory Visit: Payer: Self-pay

## 2022-01-26 ENCOUNTER — Encounter: Payer: Self-pay | Admitting: Nurse Practitioner

## 2022-01-26 VITALS — BP 121/85 | HR 78 | Resp 18

## 2022-01-26 DIAGNOSIS — I82431 Acute embolism and thrombosis of right popliteal vein: Secondary | ICD-10-CM

## 2022-01-26 DIAGNOSIS — J4520 Mild intermittent asthma, uncomplicated: Secondary | ICD-10-CM

## 2022-01-26 MED ORDER — APIXABAN 5 MG PO TABS
ORAL_TABLET | ORAL | 0 refills | Status: DC
  Filled 2022-01-26: qty 74, 30d supply, fill #0

## 2022-01-26 MED ORDER — ALBUTEROL SULFATE HFA 108 (90 BASE) MCG/ACT IN AERS
2.0000 | INHALATION_SPRAY | Freq: Four times a day (QID) | RESPIRATORY_TRACT | 0 refills | Status: DC | PRN
  Filled 2022-01-26: qty 8.5, 25d supply, fill #0

## 2022-01-26 NOTE — Progress Notes (Signed)
@Patient  ID: , female    DOB: 1981/04/19, 41 y.o.   MRN: [id number]  Chief Complaint  Patient presents with   Follow-up    Hospital follow up - DVT    Referring provider: [name], [id number], NP   HPI  41 year old female with history of DVT, PE, diabetes  Patient presents today for a hospital follow up. Patient has past history of DVTs and was diagnosed with new right lower extremity DVT. Patient was prescribed Eliquis at discharge (previously on coumadin) was patient did not get medication due to cost. She is still on coumadin. Will send prescription to [facility] for eliquis. Patient has history of asthma and went to the ED for shortnes of breath. CT was negative for acute illness or PE. Will prescribe albuterol. Denies f/c/s, n/v/d, hemoptysis, PND, leg swelling Denies chest pain or edema     Allergies  Allergen Reactions   Amoxapine And Related Other (See Comments)    Burns skin, hair falls out   Amoxicillin Other (See Comments)    Burns skin and hair falls out   Oxycodone Itching   Percocet [Oxycodone-Acetaminophen] Itching   Morphine Itching and Other (See Comments)    Must receive Benadryl to tolerate   Rocephin [Ceftriaxone] Swelling, Rash and Other (See Comments)    Swelling at site where received   Tramadol Itching and Other (See Comments)    Must receive Benadryl to tolerate    Immunization History  Administered Date(s) Administered   Influenza,inj,Quad PF,6+ Mos 05/12/2021   PFIZER Comirnaty(Gray Top)Covid-19 Tri-Sucrose Vaccine 11/05/2019, 11/26/2019, 11/02/2020   PNEUMOCOCCAL CONJUGATE-20 05/24/2021   Tdap 11/18/2012    Past Medical History:  Diagnosis Date   Acute deep vein thrombosis (DVT) of right lower extremity (HCC) 01/18/2021   Acute pulmonary embolism (HCC) 01/19/2021   Asthma    Cellulitis in diabetic foot (HCC)    Diabetes mellitus without complication (HCC)    DVT (deep venous thrombosis) (HCC) 02/05/2021   PE  (pulmonary thromboembolism) (HCC) 2016    Tobacco History: Social History   Tobacco Use  Smoking Status Never  Smokeless Tobacco Never   Counseling given: Not Answered   Outpatient Encounter Medications as of 01/26/2022  Medication Sig   albuterol (VENTOLIN HFA) 108 (90 Base) MCG/ACT inhaler Inhale 2 puffs into the lungs every 6 (six) hours as needed for wheezing or shortness of breath.   acetaminophen (TYLENOL) 325 MG tablet Take 2 tablets (650 mg total) by mouth every 6 (six) hours as needed for mild pain, fever or headache. (Patient not taking: Reported on 01/12/2022)   APIXABAN (ELIQUIS) VTE STARTER PACK (10MG  AND 5MG ) Take as directed on package: start with two-5mg  tablets twice daily for 7 days. On day 8, switch to one-5mg  tablet twice daily.   benzonatate (TESSALON) 100 MG capsule Take 1 capsule (100 mg total) by mouth every 8 (eight) hours. (Patient not taking: Reported on 01/12/2022)   Continuous Blood Gluc Sensor (DEXCOM G6 SENSOR) MISC Replace sensor every 10days. Check glucose before meals and at bedtime (Patient not taking: Reported on 01/12/2022)   Continuous Blood Gluc Transmit (DEXCOM G6 TRANSMITTER) MISC Change transmitter every 90days. Check glucose before meals and at bedtime   gabapentin (NEURONTIN) 100 MG capsule Take 1cap at bedtime, 3days, then 1cap BID x 1week, then 1cap TID continuous. (Patient not taking: Reported on 01/12/2022)   HYDROcodone bit-homatropine (HYCODAN) 5-1.5 MG/5ML syrup Take 5 mLs by mouth every 6 (six) hours as needed for cough.   HYDROcodone-acetaminophen (NORCO/VICODIN)  5-325 MG tablet Take 1 tablet by mouth every 6 (six) hours as needed for severe pain.   insulin aspart (NOVOLOG FLEXPEN) 100 UNIT/ML FlexPen Inject 1-10 Units into the skin 3 (three) times daily with meals. Insulin (Novolog) sliding scale for Novolog: administer before meals. Sugar <150 - no additional units Sugar <151-200 - 2 units Sugar >200-250 - 6 units Sugar >251-300 -  8 units Sugar >351- 10 units and call office. (Patient taking differently: Inject 1-10 Units into the skin 3 (three) times daily before meals. Inject 1-10 units into the skin 3 times a day before meals, PER SLIDING SCALE: Sugar <150 - no additional units Sugar <151-200 - 2 units Sugar >200-250 - 6 units Sugar >251-300 - 8 units Sugar >351- 10 units and call office)   Insulin Pen Needle (PEN NEEDLES) 31G X 6 MM MISC 1 application by Does not apply route once a week. For ozempic injection   metFORMIN (GLUCOPHAGE-XR) 750 MG 24 hr tablet Take 1 tablet (750 mg total) by mouth daily with breakfast. (Patient taking differently: Take 750 mg by mouth at bedtime.)   sennosides-docusate sodium (SENOKOT-S) 8.6-50 MG tablet Take 1 tablet by mouth daily. (Patient not taking: Reported on 01/12/2022)   TOUJEO SOLOSTAR 300 UNIT/ML Solostar Pen Inject 30 Units into the skin daily. (Patient not taking: Reported on 01/12/2022)   [DISCONTINUED] APIXABAN (ELIQUIS) VTE STARTER PACK (10MG  AND 5MG ) Take as directed on package: start with two-5mg  tablets twice daily for 7 days. On day 8, switch to one-5mg  tablet twice daily.   [DISCONTINUED] diphenhydrAMINE (BENADRYL) 25 mg capsule Take 1 capsule (25 mg total) by mouth every 6 (six) hours as needed for itching. (Patient not taking: No sig reported)   [DISCONTINUED] metoCLOPramide (REGLAN) 10 MG tablet Take 1 tablet (10 mg total) by mouth every 6 (six) hours as needed (Nausea or headache). (Patient not taking: Reported on 10/12/2017)   [DISCONTINUED] promethazine (PHENERGAN) 25 MG tablet Take 1 tablet (25 mg total) by mouth every 6 (six) hours as needed for nausea or vomiting (headache). (Patient not taking: No sig reported)   No facility-administered encounter medications on file as of 01/26/2022.     Review of Systems  Review of Systems  Constitutional: Negative.   HENT: Negative.    Respiratory:  Positive for shortness of breath.   Cardiovascular: Negative.    Gastrointestinal: Negative.   Allergic/Immunologic: Negative.   Neurological: Negative.   Psychiatric/Behavioral: Negative.         Physical Exam  BP 121/85   Pulse 78   Resp 18   SpO2 100%   Wt Readings from Last 5 Encounters:  06/29/21 242 lb 3.2 oz (109.9 kg)  06/13/21 235 lb 4.8 oz (106.7 kg)  05/24/21 235 lb (106.6 kg)  05/20/21 235 lb (106.6 kg)  05/10/21 239 lb (108.4 kg)     Physical Exam Vitals and nursing note reviewed.  Constitutional:      General: She is not in acute distress.    Appearance: She is well-developed.  Cardiovascular:     Rate and Rhythm: Normal rate and regular rhythm.  Pulmonary:     Effort: Pulmonary effort is normal.     Breath sounds: Normal breath sounds.  Neurological:     Mental Status: She is alert and oriented to person, place, and time.      Lab Results:  CBC    Component Value Date/Time   WBC 7.3 01/12/2022 1527   RBC 6.19 (H) 01/12/2022 1527  HGB 14.4 01/12/2022 1527   HGB 12.1 05/20/2021 1017   HCT 46.0 01/12/2022 1527   PLT 295 01/12/2022 1527   PLT 221 05/20/2021 1017   MCV 74.3 (L) 01/12/2022 1527   MCH 23.3 (L) 01/12/2022 1527   MCHC 31.3 01/12/2022 1527   RDW 15.0 01/12/2022 1527   LYMPHSABS 3.3 05/20/2021 1017   MONOABS 0.5 05/20/2021 1017   EOSABS 0.3 05/20/2021 1017   BASOSABS 0.1 05/20/2021 1017    BMET    Component Value Date/Time   NA 132 (L) 01/12/2022 1527   K 4.2 01/12/2022 1527   CL 102 01/12/2022 1527   CO2 24 01/12/2022 1527   GLUCOSE 407 (H) 01/12/2022 1527   BUN 10 01/12/2022 1527   CREATININE 0.99 01/12/2022 1527   CREATININE 0.90 05/20/2021 1017   CALCIUM 8.9 01/12/2022 1527   GFRNONAA >60 01/12/2022 1527   GFRNONAA >60 05/20/2021 1017   GFRAA >60 10/31/2019 1713    BNP    Component Value Date/Time   BNP 12.7 01/12/2022 1527    ProBNP No results found for: "PROBNP"  Imaging: VAS [place] LOWER EXTREMITY VENOUS (DVT) (7a-7p)  Result Date: 01/12/2022  Lower Venous DVT  Study Patient Name:  EMALENE GAZZO  Date of Exam:   01/12/2022 Medical Rec #: [id number]    Accession #:    [id number] Date of Birth: [birth year]   Patient Gender: F Patient Age:   89 years Exam Location:  [facility] Procedure:      VAS [place] LOWER EXTREMITY VENOUS (DVT) Referring Phys: AMJAD ALI --------------------------------------------------------------------------------  Indications: Swelling.  Risk Factors: DVT Previous DVT. Comparison Study: 05/10/21, Positive for Chronic in FV and calf veins,                   indeterminate age of thrombus in the popliteal v. Performing Technologist: Bobetta Lime BS, RVT  Examination Guidelines: A complete evaluation includes B-mode imaging, spectral Doppler, color Doppler, and power Doppler as needed of all accessible portions of each vessel. Bilateral testing is considered an integral part of a complete examination. Limited examinations for reoccurring indications may be performed as noted. The reflux portion of the exam is performed with the patient in reverse Trendelenburg.  +---------+---------------+---------+-----------+----------+-------------------+ RIGHT    CompressibilityPhasicitySpontaneityPropertiesThrombus Aging      +---------+---------------+---------+-----------+----------+-------------------+ CFV      Full           Yes      Yes                                      +---------+---------------+---------+-----------+----------+-------------------+ SFJ      Full                                                             +---------+---------------+---------+-----------+----------+-------------------+ FV Prox  Full                                                             +---------+---------------+---------+-----------+----------+-------------------+ FV Mid   Partial  Yes      Yes                  Chronic             +---------+---------------+---------+-----------+----------+-------------------+ FV DistalFull            Yes      Yes                                      +---------+---------------+---------+-----------+----------+-------------------+ PFV      Full                                                             +---------+---------------+---------+-----------+----------+-------------------+ POP      Partial        Yes      Yes                  Acute               +---------+---------------+---------+-----------+----------+-------------------+ PTV      None                                         Age Indeterminate   +---------+---------------+---------+-----------+----------+-------------------+ PERO                                                  Not well visualized +---------+---------------+---------+-----------+----------+-------------------+   +---------+---------------+---------+-----------+----------+--------------+ LEFT     CompressibilityPhasicitySpontaneityPropertiesThrombus Aging +---------+---------------+---------+-----------+----------+--------------+ CFV      Full           Yes      Yes                                 +---------+---------------+---------+-----------+----------+--------------+ SFJ      Full                                                        +---------+---------------+---------+-----------+----------+--------------+ FV Prox  Full                                                        +---------+---------------+---------+-----------+----------+--------------+ FV Mid   Full                                                        +---------+---------------+---------+-----------+----------+--------------+ FV DistalFull                                                        +---------+---------------+---------+-----------+----------+--------------+  PFV      Full                                                        +---------+---------------+---------+-----------+----------+--------------+ POP      Full            Yes      Yes                                 +---------+---------------+---------+-----------+----------+--------------+ PTV      Full                                                        +---------+---------------+---------+-----------+----------+--------------+ PERO     Full                                                        +---------+---------------+---------+-----------+----------+--------------+     Summary: RIGHT: - Findings consistent with acute deep vein thrombosis involving the right popliteal vein. - Findings consistent with age indeterminate deep vein thrombosis involving the right posterior tibial veins. - Findings consistent with chronic deep vein thrombosis involving the right femoral vein. - No cystic structure found in the popliteal fossa.  LEFT: - No evidence of deep vein thrombosis in the lower extremity. No indirect evidence of obstruction proximal to the inguinal ligament. - No cystic structure found in the popliteal fossa.  *See table(s) above for measurements and observations. Electronically signed by Jamelle Haring on 01/12/2022 at 8:25:42 PM.    Final    CT Angio Chest PE W and/or Wo Contrast  Result Date: 01/12/2022 CLINICAL DATA:  Rule out pulmonary embolism. EXAM: CT ANGIOGRAPHY CHEST WITH CONTRAST TECHNIQUE: Multidetector CT imaging of the chest was performed using the standard protocol during bolus administration of intravenous contrast. Multiplanar CT image reconstructions and MIPs were obtained to evaluate the vascular anatomy. RADIATION DOSE REDUCTION: This exam was performed according to the departmental dose-optimization program which includes automated exposure control, adjustment of the mA and/or kV according to patient size and/or use of iterative reconstruction technique. CONTRAST:  93mL OMNIPAQUE IOHEXOL 350 MG/ML SOLN COMPARISON:  01/18/2021 FINDINGS: Cardiovascular: Diminished exam detail (especially within the lower lobes) secondary to  respiratory motion artifact. There is satisfactory opacification of the pulmonary arteries to the segmental level. No signs of acute pulmonary embolus identified. Heart size is normal.  No pericardial effusion. Mediastinum/Nodes: No enlarged mediastinal, hilar, or axillary lymph nodes. Thyroid gland, trachea, and esophagus demonstrate no significant findings. Lungs/Pleura: No pleural effusion, airspace consolidation, atelectasis or pneumothorax. No suspicious pulmonary nodule or mass. Upper Abdomen: No acute abnormality. Musculoskeletal: No chest wall abnormality. No acute or significant osseous findings. Review of the MIP images confirms the above findings. IMPRESSION: 1. Diminished exam detail (especially within the lower lobes) secondary to respiratory motion artifact. Within this limitation there is no evidence for acute pulmonary embolus. 2. No acute cardiopulmonary abnormalities identified to explain patient's shortness of breath.  Electronically Signed   By: Kerby Moors M.D.   On: 01/12/2022 17:28   DG Chest 2 View  Result Date: 01/12/2022 CLINICAL DATA:  Chest pain EXAM: CHEST - 2 VIEW COMPARISON:  12/20/2021 FINDINGS: Cardiac and mediastinal contours normal. Vascularity normal. Lungs clear without infiltrate effusion Large cervical rib on the left unchanged. No cervical rib on the right. IMPRESSION: No active cardiopulmonary disease. Electronically Signed   By: Franchot Gallo M.D.   On: 01/12/2022 15:28     Assessment & Plan:   Acute DVT (deep venous thrombosis) (HCC) - APIXABAN (ELIQUIS) VTE STARTER PACK (10MG  AND 5MG ); Take as directed on package: start with two-5mg  tablets twice daily for 7 days. On day 8, switch to one-5mg  tablet twice daily.  Dispense: 1 each; Refill: 0  2. Mild intermittent asthma, unspecified whether complicated  - albuterol (VENTOLIN HFA) 108 (90 Base) MCG/ACT inhaler; Inhale 2 puffs into the lungs every 6 (six) hours as needed for wheezing or shortness of breath.   Dispense: 8 g; Refill: 0   Follow up:  Follow up in 4 weeks for physical     Fenton Foy, NP 01/26/2022

## 2022-01-26 NOTE — Assessment & Plan Note (Signed)
-   APIXABAN (ELIQUIS) VTE STARTER PACK (10MG  AND 5MG ); Take as directed on package: start with two-5mg  tablets twice daily for 7 days. On day 8, switch to one-5mg  tablet twice daily.  Dispense: 1 each; Refill: 0  2. Mild intermittent asthma, unspecified whether complicated  - albuterol (VENTOLIN HFA) 108 (90 Base) MCG/ACT inhaler; Inhale 2 puffs into the lungs every 6 (six) hours as needed for wheezing or shortness of breath.  Dispense: 8 g; Refill: 0   Follow up:  Follow up in 4 weeks for physical

## 2022-01-26 NOTE — Patient Instructions (Addendum)
1. Acute deep vein thrombosis (DVT) of popliteal vein of right lower extremity (HCC)  - APIXABAN (ELIQUIS) VTE STARTER PACK (10MG  AND 5MG ); Take as directed on package: start with two-5mg  tablets twice daily for 7 days. On day 8, switch to one-5mg  tablet twice daily.  Dispense: 1 each; Refill: 0  2. Mild intermittent asthma, unspecified whether complicated  - albuterol (VENTOLIN HFA) 108 (90 Base) MCG/ACT inhaler; Inhale 2 puffs into the lungs every 6 (six) hours as needed for wheezing or shortness of breath.  Dispense: 8 g; Refill: 0   Follow up:  Follow up in 4 weeks for physical

## 2022-03-01 ENCOUNTER — Encounter: Payer: Self-pay | Admitting: Nurse Practitioner

## 2022-03-01 ENCOUNTER — Other Ambulatory Visit: Payer: Self-pay

## 2022-03-01 ENCOUNTER — Ambulatory Visit (INDEPENDENT_AMBULATORY_CARE_PROVIDER_SITE_OTHER): Payer: Self-pay | Admitting: Nurse Practitioner

## 2022-03-01 VITALS — BP 114/80 | HR 80 | Temp 97.5°F | Ht 66.0 in | Wt 235.0 lb

## 2022-03-01 DIAGNOSIS — Z0000 Encounter for general adult medical examination without abnormal findings: Secondary | ICD-10-CM

## 2022-03-01 DIAGNOSIS — Z794 Long term (current) use of insulin: Secondary | ICD-10-CM

## 2022-03-01 DIAGNOSIS — E1165 Type 2 diabetes mellitus with hyperglycemia: Secondary | ICD-10-CM

## 2022-03-01 LAB — POCT URINALYSIS DIPSTICK
Bilirubin, UA: NEGATIVE
Blood, UA: NEGATIVE
Glucose, UA: POSITIVE — AB
Leukocytes, UA: NEGATIVE
Nitrite, UA: NEGATIVE
Protein, UA: NEGATIVE
Spec Grav, UA: 1.025 (ref 1.010–1.025)
Urobilinogen, UA: 0.2 E.U./dL
pH, UA: 5.5 (ref 5.0–8.0)

## 2022-03-01 MED ORDER — METFORMIN HCL ER 750 MG PO TB24
750.0000 mg | ORAL_TABLET | Freq: Every day | ORAL | 1 refills | Status: DC
  Filled 2022-03-01: qty 30, 30d supply, fill #0

## 2022-03-01 MED ORDER — ACCU-CHEK SOFTCLIX LANCETS MISC
12 refills | Status: AC
  Filled 2022-03-01: qty 100, 25d supply, fill #0

## 2022-03-01 MED ORDER — ACCU-CHEK GUIDE VI STRP
ORAL_STRIP | 12 refills | Status: AC
  Filled 2022-03-01: qty 100, 25d supply, fill #0

## 2022-03-01 MED ORDER — APIXABAN 5 MG PO TABS
5.0000 mg | ORAL_TABLET | Freq: Two times a day (BID) | ORAL | 5 refills | Status: DC
  Filled 2022-03-01: qty 60, 30d supply, fill #0
  Filled 2023-02-19: qty 60, 30d supply, fill #1

## 2022-03-01 MED ORDER — BLOOD GLUCOSE MONITOR KIT
PACK | 0 refills | Status: AC
  Filled 2022-03-01: qty 1, 25d supply, fill #0

## 2022-03-01 MED ORDER — NOVOLOG FLEXPEN 100 UNIT/ML ~~LOC~~ SOPN: 1.0000 [IU] | PEN_INJECTOR | Freq: Three times a day (TID) | SUBCUTANEOUS | 1 refills | Status: DC

## 2022-03-01 NOTE — Assessment & Plan Note (Signed)
-  CBC - Comprehensive metabolic panel - TSH - Lipid Panel - Microalbumin/Creatinine Ratio, Urine - Hemoglobin A1c  2. Type 2 diabetes mellitus with hyperglycemia, without long-term current use of insulin (HCC)  - CBC - Comprehensive metabolic panel - TSH - Lipid Panel - Microalbumin/Creatinine Ratio, Urine - Hemoglobin A1c - blood glucose meter kit and supplies KIT; Dispense based on patient and insurance preference. Use up to four times daily as directed.  Dispense: 1 each; Refill: 0 - Accu-Chek Softclix Lancets lancets; Use as instructed  Dispense: 100 each; Refill: 12 - glucose blood (ACCU-CHEK GUIDE) test strip; Use as instructed  Dispense: 100 each; Refill: 12  3. Type 2 diabetes mellitus with hyperglycemia, with long-term current use of insulin (HCC)  - insulin aspart (NOVOLOG FLEXPEN) 100 UNIT/ML FlexPen; Inject 1-10 Units into the skin 3 (three) times daily with meals. Insulin (Novolog) sliding scale for Novolog: administer 71mnutes before meals. Sugar <150 - no additional units Sugar <151-200 - 2 units Sugar >200-250 - 6 units Sugar >251-300 - 8 units Sugar >351- 10 units and call office.  Dispense: 15 mL; Refill: 1 - metFORMIN (GLUCOPHAGE-XR) 750 MG 24 hr tablet; Take 1 tablet (750 mg total) by mouth daily with breakfast.  Dispense: 90 tablet; Refill: 1  Follow up:  Follow up in 3 months or sooner if needed

## 2022-03-01 NOTE — Progress Notes (Signed)
$'@Patient'P$  ID: Shannon Oneal, female    DOB: 01/02/1981, 41 y.o.   MRN: [id number]  Chief Complaint  Patient presents with   Annual Exam    Referring provider: [name], [name], NP   HPI  41 year old female with history of DVT, PE, diabetes, asthma.  Patient presents today for physical.  Overall patient has been doing well.  Patient states that she has not been taking insulin due to cost concerns.  She did recently start working again.  We discussed that we will send insulin and metformin refills to [facility].  As well as a prescription for glucometer and supplies. Denies f/c/s, n/v/d, hemoptysis, PND, leg swelling Denies chest pain or edema     Allergies  Allergen Reactions   Amoxapine And Related Other (See Comments)    Burns skin, hair falls out   Amoxicillin Other (See Comments)    Burns skin and hair falls out   Oxycodone Itching   Percocet [Oxycodone-Acetaminophen] Itching   Morphine Itching and Other (See Comments)    Must receive Benadryl to tolerate   Rocephin [Ceftriaxone] Swelling, Rash and Other (See Comments)    Swelling at site where received   Tramadol Itching and Other (See Comments)    Must receive Benadryl to tolerate    Immunization History  Administered Date(s) Administered   Influenza,inj,Quad PF,6+ Mos 05/12/2021   PFIZER Comirnaty(Gray Top)Covid-19 Tri-Sucrose Vaccine 11/05/2019, 11/26/2019, 11/02/2020   PNEUMOCOCCAL CONJUGATE-20 05/24/2021   Tdap 11/18/2012    Past Medical History:  Diagnosis Date   Acute deep vein thrombosis (DVT) of right lower extremity ([place]) 01/18/2021   Acute pulmonary embolism ([place]) 01/19/2021   Asthma    Cellulitis in diabetic foot ([place])    Diabetes mellitus without complication ([place])    DVT (deep venous thrombosis) ([place]) 02/05/2021   DVT (deep venous thrombosis) ([place])    PE (pulmonary thromboembolism) ([place]) 2016    Tobacco History: Social History   Tobacco Use  Smoking Status Never   Smokeless Tobacco Never   Counseling given: Not Answered   Outpatient Encounter Medications as of 03/01/2022  Medication Sig   Accu-Chek Softclix Lancets lancets Use as instructed   albuterol (VENTOLIN HFA) 108 (90 Base) MCG/ACT inhaler Inhale 2 puffs into the lungs every 6 (six) hours as needed for wheezing or shortness of breath.   apixaban (ELIQUIS) 5 MG TABS tablet start with two-$RemoveBefore'5mg'VSuCxkJQlrXNT$  tablets twice daily for 7 days. On day 8, switch to one-$RemoveBefor'5mg'JqEnJSbMMOac$  tablet twice daily.   blood glucose meter kit and supplies KIT Dispense based on patient and insurance preference. Use up to four times daily as directed.   glucose blood (ACCU-CHEK GUIDE) test strip Use as instructed   HYDROcodone-acetaminophen (NORCO/VICODIN) 5-325 MG tablet Take 1 tablet by mouth every 6 (six) hours as needed for severe pain.   Insulin Pen Needle (PEN NEEDLES) 31G X 6 MM MISC 1 application by Does not apply route once a week. For ozempic injection   [DISCONTINUED] Continuous Blood Gluc Transmit (DEXCOM G6 TRANSMITTER) MISC Change transmitter every 90days. Check glucose before meals and at bedtime   [DISCONTINUED] insulin aspart (NOVOLOG FLEXPEN) 100 UNIT/ML FlexPen Inject 1-10 Units into the skin 3 (three) times daily with meals. Insulin (Novolog) sliding scale for Novolog: administer 31minutes before meals. Sugar <150 - no additional units Sugar <151-200 - 2 units Sugar >200-250 - 6 units Sugar >251-300 - 8 units Sugar >351- 10 units and call office. (Patient taking differently: Inject 1-10 Units into the skin 3 (three) times  daily before meals. Inject 1-10 units into the skin 3 times a day before meals, PER SLIDING SCALE: Sugar <150 - no additional units Sugar <151-200 - 2 units Sugar >200-250 - 6 units Sugar >251-300 - 8 units Sugar >351- 10 units and call office)   [DISCONTINUED] metFORMIN (GLUCOPHAGE-XR) 750 MG 24 hr tablet Take 1 tablet (750 mg total) by mouth daily with breakfast. (Patient taking differently: Take 750 mg  by mouth at bedtime.)   insulin aspart (NOVOLOG FLEXPEN) 100 UNIT/ML FlexPen Inject 1-10 Units into the skin 3 (three) times daily with meals. Insulin (Novolog) sliding scale for Novolog: administer 97minutes before meals. Sugar <150 - no additional units Sugar <151-200 - 2 units Sugar >200-250 - 6 units Sugar >251-300 - 8 units Sugar >351- 10 units and call office.   metFORMIN (GLUCOPHAGE-XR) 750 MG 24 hr tablet Take 1 tablet (750 mg total) by mouth daily with breakfast.   [DISCONTINUED] acetaminophen (TYLENOL) 325 MG tablet Take 2 tablets (650 mg total) by mouth every 6 (six) hours as needed for mild pain, fever or headache. (Patient not taking: Reported on 01/12/2022)   [DISCONTINUED] benzonatate (TESSALON) 100 MG capsule Take 1 capsule (100 mg total) by mouth every 8 (eight) hours. (Patient not taking: Reported on 01/12/2022)   [DISCONTINUED] Continuous Blood Gluc Sensor (DEXCOM G6 SENSOR) MISC Replace sensor every 10days. Check glucose before meals and at bedtime (Patient not taking: Reported on 01/12/2022)   [DISCONTINUED] diphenhydrAMINE (BENADRYL) 25 mg capsule Take 1 capsule (25 mg total) by mouth every 6 (six) hours as needed for itching. (Patient not taking: No sig reported)   [DISCONTINUED] gabapentin (NEURONTIN) 100 MG capsule Take 1cap at bedtime, 3days, then 1cap BID x 1week, then 1cap TID continuous. (Patient not taking: Reported on 01/12/2022)   [DISCONTINUED] HYDROcodone bit-homatropine (HYCODAN) 5-1.5 MG/5ML syrup Take 5 mLs by mouth every 6 (six) hours as needed for cough.   [DISCONTINUED] metoCLOPramide (REGLAN) 10 MG tablet Take 1 tablet (10 mg total) by mouth every 6 (six) hours as needed (Nausea or headache). (Patient not taking: Reported on 10/12/2017)   [DISCONTINUED] promethazine (PHENERGAN) 25 MG tablet Take 1 tablet (25 mg total) by mouth every 6 (six) hours as needed for nausea or vomiting (headache). (Patient not taking: No sig reported)   [DISCONTINUED] sennosides-docusate  sodium (SENOKOT-S) 8.6-50 MG tablet Take 1 tablet by mouth daily. (Patient not taking: Reported on 01/12/2022)   [DISCONTINUED] TOUJEO SOLOSTAR 300 UNIT/ML Solostar Pen Inject 30 Units into the skin daily. (Patient not taking: Reported on 01/12/2022)   No facility-administered encounter medications on file as of 03/01/2022.     Review of Systems  Review of Systems  Constitutional: Negative.   HENT: Negative.    Cardiovascular: Negative.   Gastrointestinal: Negative.   Allergic/Immunologic: Negative.   Neurological: Negative.   Psychiatric/Behavioral: Negative.         Physical Exam  BP 114/80   Pulse 80   Temp (!) 97.5 F (36.4 C) (Temporal)   Ht $R'5\' 6"'tx$  (1.676 m)   Wt 235 lb (106.6 kg)   SpO2 100%   BMI 37.93 kg/m   Wt Readings from Last 5 Encounters:  03/01/22 235 lb (106.6 kg)  06/29/21 242 lb 3.2 oz (109.9 kg)  06/13/21 235 lb 4.8 oz (106.7 kg)  05/24/21 235 lb (106.6 kg)  05/20/21 235 lb (106.6 kg)     Physical Exam Vitals and nursing note reviewed.  Constitutional:      General: She is not in acute distress.  Appearance: She is well-developed.  Cardiovascular:     Rate and Rhythm: Normal rate and regular rhythm.  Pulmonary:     Effort: Pulmonary effort is normal.     Breath sounds: Normal breath sounds.  Neurological:     Mental Status: She is alert and oriented to person, place, and time.  Psychiatric:        Mood and Affect: Mood normal.        Behavior: Behavior normal.      Lab Results:  CBC    Component Value Date/Time   WBC 7.3 01/12/2022 1527   RBC 6.19 (H) 01/12/2022 1527   HGB 14.4 01/12/2022 1527   HGB 12.1 05/20/2021 1017   HCT 46.0 01/12/2022 1527   PLT 295 01/12/2022 1527   PLT 221 05/20/2021 1017   MCV 74.3 (L) 01/12/2022 1527   MCH 23.3 (L) 01/12/2022 1527   MCHC 31.3 01/12/2022 1527   RDW 15.0 01/12/2022 1527   LYMPHSABS 3.3 05/20/2021 1017   MONOABS 0.5 05/20/2021 1017   EOSABS 0.3 05/20/2021 1017   BASOSABS 0.1  05/20/2021 1017    BMET    Component Value Date/Time   NA 132 (L) 01/12/2022 1527   K 4.2 01/12/2022 1527   CL 102 01/12/2022 1527   CO2 24 01/12/2022 1527   GLUCOSE 407 (H) 01/12/2022 1527   BUN 10 01/12/2022 1527   CREATININE 0.99 01/12/2022 1527   CREATININE 0.90 05/20/2021 1017   CALCIUM 8.9 01/12/2022 1527   GFRNONAA >60 01/12/2022 1527   GFRNONAA >60 05/20/2021 1017   GFRAA >60 10/31/2019 1713    BNP    Component Value Date/Time   BNP 12.7 01/12/2022 1527    ProBNP No results found for: "PROBNP"  Imaging: No results found.   Assessment & Plan:   Routine adult health maintenance - CBC - Comprehensive metabolic panel - TSH - Lipid Panel - Microalbumin/Creatinine Ratio, Urine - Hemoglobin A1c  2. Type 2 diabetes mellitus with hyperglycemia, without long-term current use of insulin (HCC)  - CBC - Comprehensive metabolic panel - TSH - Lipid Panel - Microalbumin/Creatinine Ratio, Urine - Hemoglobin A1c - blood glucose meter kit and supplies KIT; Dispense based on patient and insurance preference. Use up to four times daily as directed.  Dispense: 1 each; Refill: 0 - Accu-Chek Softclix Lancets lancets; Use as instructed  Dispense: 100 each; Refill: 12 - glucose blood (ACCU-CHEK GUIDE) test strip; Use as instructed  Dispense: 100 each; Refill: 12  3. Type 2 diabetes mellitus with hyperglycemia, with long-term current use of insulin (HCC)  - insulin aspart (NOVOLOG FLEXPEN) 100 UNIT/ML FlexPen; Inject 1-10 Units into the skin 3 (three) times daily with meals. Insulin (Novolog) sliding scale for Novolog: administer 12minutes before meals. Sugar <150 - no additional units Sugar <151-200 - 2 units Sugar >200-250 - 6 units Sugar >251-300 - 8 units Sugar >351- 10 units and call office.  Dispense: 15 mL; Refill: 1 - metFORMIN (GLUCOPHAGE-XR) 750 MG 24 hr tablet; Take 1 tablet (750 mg total) by mouth daily with breakfast.  Dispense: 90 tablet; Refill: 1  Follow  up:  Follow up in 3 months or sooner if needed     Fenton Foy, NP 03/01/2022

## 2022-03-01 NOTE — Patient Instructions (Signed)
1. Routine adult health maintenance  - CBC - Comprehensive metabolic panel - TSH - Lipid Panel - Microalbumin/Creatinine Ratio, Urine - Hemoglobin A1c  2. Type 2 diabetes mellitus with hyperglycemia, without long-term current use of insulin (HCC)  - CBC - Comprehensive metabolic panel - TSH - Lipid Panel - Microalbumin/Creatinine Ratio, Urine - Hemoglobin A1c - blood glucose meter kit and supplies KIT; Dispense based on patient and insurance preference. Use up to four times daily as directed.  Dispense: 1 each; Refill: 0 - Accu-Chek Softclix Lancets lancets; Use as instructed  Dispense: 100 each; Refill: 12 - glucose blood (ACCU-CHEK GUIDE) test strip; Use as instructed  Dispense: 100 each; Refill: 12  3. Type 2 diabetes mellitus with hyperglycemia, with long-term current use of insulin (HCC)  - insulin aspart (NOVOLOG FLEXPEN) 100 UNIT/ML FlexPen; Inject 1-10 Units into the skin 3 (three) times daily with meals. Insulin (Novolog) sliding scale for Novolog: administer 65minutes before meals. Sugar <150 - no additional units Sugar <151-200 - 2 units Sugar >200-250 - 6 units Sugar >251-300 - 8 units Sugar >351- 10 units and call office.  Dispense: 15 mL; Refill: 1 - metFORMIN (GLUCOPHAGE-XR) 750 MG 24 hr tablet; Take 1 tablet (750 mg total) by mouth daily with breakfast.  Dispense: 90 tablet; Refill: 1  Follow up:  Follow up in 3 months or sooner if needed

## 2022-03-02 ENCOUNTER — Other Ambulatory Visit: Payer: Self-pay

## 2022-03-02 LAB — CBC
Hematocrit: 44.8 % (ref 34.0–46.6)
Hemoglobin: 14.4 g/dL (ref 11.1–15.9)
MCH: 23.3 pg — ABNORMAL LOW (ref 26.6–33.0)
MCHC: 32.1 g/dL (ref 31.5–35.7)
MCV: 72 fL — ABNORMAL LOW (ref 79–97)
Platelets: 347 10*3/uL (ref 150–450)
RBC: 6.19 x10E6/uL — ABNORMAL HIGH (ref 3.77–5.28)
RDW: 14.1 % (ref 11.7–15.4)
WBC: 7.3 10*3/uL (ref 3.4–10.8)

## 2022-03-02 LAB — HEMOGLOBIN A1C
Est. average glucose Bld gHb Est-mCnc: 269 mg/dL
Hgb A1c MFr Bld: 11 % — ABNORMAL HIGH (ref 4.8–5.6)

## 2022-03-02 LAB — TSH: TSH: 1.14 u[IU]/mL (ref 0.450–4.500)

## 2022-03-02 LAB — COMPREHENSIVE METABOLIC PANEL
ALT: 25 IU/L (ref 0–32)
AST: 21 IU/L (ref 0–40)
Albumin/Globulin Ratio: 1.2 (ref 1.2–2.2)
Albumin: 4 g/dL (ref 3.9–4.9)
Alkaline Phosphatase: 78 IU/L (ref 44–121)
BUN/Creatinine Ratio: 12 (ref 9–23)
BUN: 10 mg/dL (ref 6–24)
Bilirubin Total: 0.3 mg/dL (ref 0.0–1.2)
CO2: 21 mmol/L (ref 20–29)
Calcium: 9.4 mg/dL (ref 8.7–10.2)
Chloride: 103 mmol/L (ref 96–106)
Creatinine, Ser: 0.84 mg/dL (ref 0.57–1.00)
Globulin, Total: 3.4 g/dL (ref 1.5–4.5)
Glucose: 241 mg/dL — ABNORMAL HIGH (ref 70–99)
Potassium: 4.4 mmol/L (ref 3.5–5.2)
Sodium: 138 mmol/L (ref 134–144)
Total Protein: 7.4 g/dL (ref 6.0–8.5)
eGFR: 89 mL/min/{1.73_m2} (ref >59)

## 2022-03-02 LAB — LIPID PANEL
Chol/HDL Ratio: 4.4 ratio (ref 0.0–4.4)
Cholesterol, Total: 123 mg/dL (ref 100–199)
HDL: 28 mg/dL — ABNORMAL LOW (ref >39)
LDL Chol Calc (NIH): 80 mg/dL (ref 0–99)
Triglycerides: 74 mg/dL (ref 0–149)
VLDL Cholesterol Cal: 15 mg/dL (ref 5–40)

## 2022-03-02 LAB — MICROALBUMIN / CREATININE URINE RATIO
Creatinine, Urine: 230.7 mg/dL
Microalb/Creat Ratio: 2 mg/g creat (ref 0–29)
Microalbumin, Urine: 4.2 ug/mL

## 2022-03-03 ENCOUNTER — Other Ambulatory Visit: Payer: Self-pay

## 2022-03-07 ENCOUNTER — Other Ambulatory Visit: Payer: Self-pay

## 2022-03-09 ENCOUNTER — Other Ambulatory Visit: Payer: Self-pay

## 2022-03-16 ENCOUNTER — Other Ambulatory Visit: Payer: Self-pay

## 2022-03-18 DIAGNOSIS — E1165 Type 2 diabetes mellitus with hyperglycemia: Secondary | ICD-10-CM

## 2022-03-22 NOTE — Telephone Encounter (Signed)
Hello,  Could you touch base with this patient to help with uncontrolled diabetes, Thanks.

## 2022-03-23 ENCOUNTER — Telehealth: Payer: Self-pay | Admitting: Nurse Practitioner

## 2022-03-23 NOTE — Telephone Encounter (Signed)
Caller Name: Dorathy Daft  Call back phone #: [phone]   MEDICATION(S):  ozempic  Days of Med Remaining:   Has the patient contacted their pharmacy (YES/NO)? y What did pharmacy advise?   Preferred Pharmacy:  [facility] [address], [place] - [address] Phone:  [phone]      ~~~Please advise patient/caregiver to allow 2-3 business days to process RX refills. Dorathy Daft stated that in needs to be upgraded

## 2022-03-24 NOTE — Telephone Encounter (Signed)
Called & spoke w/ patient. Says she has a new PCP and her assistant may have called to get her ozempic refilled. Was going to offer pt appt, but she had her med refilled at her [facility] pharmacy and has 6 refills.

## 2022-03-31 ENCOUNTER — Other Ambulatory Visit: Payer: Self-pay

## 2022-03-31 ENCOUNTER — Other Ambulatory Visit: Payer: Medicaid Other | Admitting: Pharmacist

## 2022-03-31 DIAGNOSIS — E1165 Type 2 diabetes mellitus with hyperglycemia: Secondary | ICD-10-CM

## 2022-03-31 MED ORDER — METFORMIN HCL ER 500 MG PO TB24
1000.0000 mg | ORAL_TABLET | Freq: Two times a day (BID) | ORAL | 3 refills | Status: DC
  Filled 2022-03-31: qty 120, 30d supply, fill #0
  Filled 2022-03-31 (×2): qty 360, 90d supply, fill #0
  Filled 2022-07-06: qty 360, 90d supply, fill #1

## 2022-03-31 MED ORDER — TRULICITY 0.75 MG/0.5ML ~~LOC~~ SOAJ
0.7500 mg | SUBCUTANEOUS | 2 refills | Status: DC
  Filled 2022-03-31: qty 2, 28d supply, fill #0
  Filled 2022-06-01: qty 2, 28d supply, fill #1

## 2022-03-31 MED ORDER — SEMAGLUTIDE (1 MG/DOSE) 4 MG/3ML ~~LOC~~ SOPN
1.0000 mg | PEN_INJECTOR | SUBCUTANEOUS | 3 refills | Status: DC
  Filled 2022-03-31: qty 9, 84d supply, fill #0

## 2022-03-31 NOTE — Patient Instructions (Signed)
Safiyah,   It was great talking to you today!  Finish your supply of metformin XR 750 mg tablets. Then, start the 500 mg tablets, taking once twice daily. After a week, if you have not experienced diarrhea, increase to 1000 mg (two of the 500 mg tablets) twice daily.   The weekly non-insulin injectables would be a great class of medication for you. For now, let's start Trulicity 0.75 mg weekly. This medication may cause stomach upset, queasiness, or constipation, especially when first starting. This generally improves over time. Call our office if these symptoms occur and worsen, or if you have severe symptoms such as vomiting, diarrhea, or stomach pain.   Here is the website with videos on how to inject: CPADirectories.es  For a long term solution, we will work with the team at the Pharmacy at [facility] to apply for patient assistance for Ozempic, since it can take several weeks for that to arrive.   Check your blood sugars twice daily:  1) Fasting, first thing in the morning before breakfast and  2) 2 hours after your largest meal.   For a goal A1c of less than 7%, goal fasting readings are less than 130 and goal 2 hour after meal readings are less than 180.   Please call me with any questions!  Catie Eppie Gibson, PharmD, [facility] [phone]

## 2022-03-31 NOTE — Progress Notes (Signed)
 Chief Complaint  Patient presents with   Diabetes   Medication Management    Shannon Oneal is a 41 y.o. year old female who presented for a telephone visit.   They were referred to the pharmacist by their PCP for assistance in managing diabetes.    Subjective:  Care Team: Primary Care Provider: Nichols, Tonya S, NP ; Next Scheduled Visit: 06/01/22  Medication Access/Adherence  Current Pharmacy:  [facility] - [place], [place] - [address] [place] [address] Phone: [phone] Fax: [phone]  [facility] [address], [address] [place] [address] Phone: [phone] Fax: [phone]   Patient reports affordability concerns with their medications: Yes  Patient reports access/transportation concerns to their pharmacy: No  Patient reports adherence concerns with their medications:  No     Diabetes:  Patient reports she was diagnosed in 2005. 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 Previously using CGM when she was insured, but has been without a job for almost a year and lost insurance.   Current medications: metformin XL 750 mg daily, does not have insulin right now Medications tried in the past: Ozempic prescribed, but never started due to lack of insurance; metformin IR - diarrhea  Current glucose readings: before meal in 200s; after meal in 300s  Patient reports hyperglycemic symptoms including polydipsia.    Health Maintenance  Health Maintenance Due  Topic Date Due   FOOT EXAM  Never done   OPHTHALMOLOGY EXAM  Never done   Hepatitis C Screening  Never done   PAP SMEAR-Modifier  Never done   COVID-19 Vaccine (4 - Pfizer series) 12/28/2020   INFLUENZA VACCINE  02/21/2022     Objective: Lab Results  Component Value Date   HGBA1C 11.0 (H) 03/01/2022    Lab Results  Component Value Date   CREATININE 0.84 03/01/2022   BUN 10 03/01/2022   NA 138  03/01/2022   K 4.4 03/01/2022   CL 103 03/01/2022   CO2 21 03/01/2022    Lab Results  Component Value Date   CHOL 123 03/01/2022   HDL 28 (L) 03/01/2022   LDLCALC 80 03/01/2022   TRIG 74 03/01/2022   CHOLHDL 4.4 03/01/2022    Medications Reviewed Today     Reviewed by Harper, Catherine T, RPH-CPP (Pharmacist) on 03/31/22 at 1035  Med List Status: <None>   Medication Order Taking? Sig Documenting Provider Last Dose Status Informant  Accu-Chek Softclix Lancets lancets [id number] Yes Use as instructed Nichols, Tonya S, NP Taking Active   albuterol (VENTOLIN HFA) 108 (90 Base) MCG/ACT inhaler [id number] Yes Inhale 2 puffs into the lungs every 6 (six) hours as needed for wheezing or shortness of breath. Nichols, Tonya S, NP Taking Active   apixaban (ELIQUIS) 5 MG TABS tablet [id number] Yes Take 1 tablet (5 mg total) by mouth 2 (two) times daily. Nichols, Tonya S, NP Taking Active   blood glucose meter kit and supplies KIT [id number] Yes Dispense based on patient and insurance preference. Use up to four times daily as directed. Nichols, Tonya S, NP Taking Active    Patient not taking:   Discontinued 02/05/21 1204 glucose blood (ACCU-CHEK GUIDE) test strip [id number] Yes Use as instructed Nichols, Tonya S, NP Taking Active   HYDROcodone-acetaminophen (NORCO/VICODIN) 5-325 MG tablet [id number] No Take 1 tablet by mouth every 6 (six) hours as needed for severe pain.  Patient not taking: Reported on 03/31/2022     Ali, Amjad, PA-C Not Taking Active   insulin aspart (NOVOLOG FLEXPEN) 100 UNIT/ML FlexPen [id number]  Inject 1-10 Units into the skin 3 (three) times daily with meals. Insulin (Novolog) sliding scale for Novolog: administer 5minutes before meals. Sugar <150 - no additional units Sugar <151-200 - 2 units Sugar >200-250 - 6 units Sugar >251-300 - 8 units Sugar >351- 10 units and call office. Nichols, Tonya S, NP  Active   Insulin Pen Needle (PEN NEEDLES) 31G X 6 MM MISC [id number]  1 application  by Does not apply route once a week. For ozempic injection [name], Charlotte Lum, NP  Active Self  metFORMIN (GLUCOPHAGE-XR) 750 MG 24 hr tablet [id number] Yes Take 1 tablet (750 mg total) by mouth daily with breakfast. Nichols, Tonya S, NP Taking Active    Patient not taking:   Discontinued 10/31/19 2134  Patient not taking:   Discontinued 01/18/21 2321           SDOH Interventions    Flowsheet Row Patient Outreach from 03/31/2022 in [place] [facility]  SDOH Interventions   Financial Strain Interventions Other (Comment)  [patient assistance]       Assessment/Plan:   Diabetes: - Currently uncontrolled - Reviewed long term cardiovascular and renal outcomes of uncontrolled blood sugar - Reviewed goal A1c, goal fasting, and goal 2 hour post prandial glucose - Recommend to maximize metformin using XR formulation. Discussed with PCP. Change to metformin XR 500 mg tablets, 1000 mg twice daily. Counseled patient to start with 500 mg twice daily, take for about a week to determine tolerability, then increase to 1000 mg twice daily as tolerated.  - Agree with recommendation for GLP1. Discussed access with Pharmacy at [facility]. They have Trulicity in stock, but Trulicity PAP is currently closed. Ozempic PAP would take ~10 weeks to come in. Discussed with PCP. Will start Trulicity 0.75 mg weekly using current supply, while applying for Ozempic patient assistance. Anticipate titration to 1 mg dose so will apply for that strength (as lower doses can be given from the 1 mg pen). Discussed with patient, she verbalizes understanding.   - Recommend to check glucose twice daily.    Follow Up Plan: phone call in 2 weeks  Catie T. Harper, PharmD, BCACP [facility] [phone]    

## 2022-04-03 ENCOUNTER — Other Ambulatory Visit: Payer: Self-pay

## 2022-04-04 ENCOUNTER — Other Ambulatory Visit: Payer: Self-pay

## 2022-04-13 ENCOUNTER — Other Ambulatory Visit: Payer: Medicaid Other | Admitting: Pharmacist

## 2022-06-01 ENCOUNTER — Other Ambulatory Visit: Payer: Medicaid Other | Admitting: Pharmacist

## 2022-06-01 ENCOUNTER — Ambulatory Visit (INDEPENDENT_AMBULATORY_CARE_PROVIDER_SITE_OTHER): Payer: Self-pay | Admitting: Nurse Practitioner

## 2022-06-01 ENCOUNTER — Other Ambulatory Visit: Payer: Self-pay

## 2022-06-01 ENCOUNTER — Encounter: Payer: Self-pay | Admitting: Nurse Practitioner

## 2022-06-01 VITALS — BP 119/78 | HR 80 | Temp 97.9°F | Ht 66.0 in | Wt 232.0 lb

## 2022-06-01 DIAGNOSIS — G629 Polyneuropathy, unspecified: Secondary | ICD-10-CM

## 2022-06-01 DIAGNOSIS — M79671 Pain in right foot: Secondary | ICD-10-CM

## 2022-06-01 DIAGNOSIS — S91209A Unspecified open wound of unspecified toe(s) with damage to nail, initial encounter: Secondary | ICD-10-CM

## 2022-06-01 DIAGNOSIS — E1165 Type 2 diabetes mellitus with hyperglycemia: Secondary | ICD-10-CM

## 2022-06-01 DIAGNOSIS — M79672 Pain in left foot: Secondary | ICD-10-CM

## 2022-06-01 LAB — POCT GLYCOSYLATED HEMOGLOBIN (HGB A1C): Hemoglobin A1C: 7.6 % — AB (ref 4.0–5.6)

## 2022-06-01 MED ORDER — DULOXETINE HCL 20 MG PO CPEP
20.0000 mg | ORAL_CAPSULE | Freq: Every day | ORAL | 0 refills | Status: DC
  Filled 2022-06-01: qty 30, 30d supply, fill #0

## 2022-06-01 NOTE — Progress Notes (Signed)
_0  ID: Shannon Oneal, female    DOB: [birth year], 41 y.o.   MRN: [id number]  Chief Complaint  Patient presents with   Follow-up    Diabetes. Pt stated--toes and top of both foot are painful and burning sensation. Tried gabapentin, lyrica-but not helping.    Referring provider: Fenton Foy, NP   HPI  41 year old female with history of DVT, PE, diabetes, asthma.   Patient presents today for diabetic follow-up.  She states that she has been out of her Trulicity for the past 3 weeks.  This is due to it being on backorder.  Patient states that her blood sugars have been around 200s.  She is taking metformin.  Her A1c is 7.6 today which is down from 11 at last visit.  Pharmacy has been consulted but patient missed her last appointment with the pharmacist.  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  Patient states that she is having bilateral foot pain.  She states that she has tried Neurontin and Lyrica for this issue and neither have helped.  We will trial Cymbalta and refer patient to podiatry.  Patient does have an issue with her toenails coming off. Denies f/c/s, n/v/d, hemoptysis, PND, leg swelling Denies chest pain or edema        Allergies  Allergen Reactions   Amoxapine And Related Other (See Comments)    Burns skin, hair falls out   Amoxicillin Other (See Comments)    Burns skin and hair falls out   Oxycodone Itching   Percocet [Oxycodone-Acetaminophen] Itching   Morphine Itching and Other (See Comments)    Must receive Benadryl to tolerate   Rocephin [Ceftriaxone] Swelling, Rash and Other (See Comments)    Swelling at site where received   Tramadol Itching and Other (See Comments)    Must receive Benadryl to tolerate    Immunization History  Administered Date(s) Administered   Influenza,inj,Quad PF,6+ Mos 05/12/2021   PFIZER Comirnaty(Gray Top)Covid-19 Tri-Sucrose Vaccine 11/05/2019, 11/26/2019, 11/02/2020   PNEUMOCOCCAL  CONJUGATE-20 05/24/2021   Tdap 11/18/2012    Past Medical History:  Diagnosis Date   Acute deep vein thrombosis (DVT) of right lower extremity ([place]) 01/18/2021   Acute pulmonary embolism ([place]) 01/19/2021   Asthma    Cellulitis in diabetic foot ([place])    Diabetes mellitus without complication ([place])    DVT (deep venous thrombosis) ([place]) 02/05/2021   DVT (deep venous thrombosis) ([place])    PE (pulmonary thromboembolism) ([place]) 2016    Tobacco History: Social History   Tobacco Use  Smoking Status Never  Smokeless Tobacco Never   Counseling given: Not Answered   Outpatient Encounter Medications as of 06/01/2022  Medication Sig   albuterol (VENTOLIN HFA) 108 (90 Base) MCG/ACT inhaler Inhale 2 puffs into the lungs every 6 (six) hours as needed for wheezing or shortness of breath.   apixaban (ELIQUIS) 5 MG TABS tablet Take 1 tablet (5 mg total) by mouth 2 (two) times daily.   DULoxetine (CYMBALTA) 20 MG capsule Take 1 capsule (20 mg total) by mouth daily.   glucose blood (ACCU-CHEK GUIDE) test strip Use as instructed   metFORMIN (GLUCOPHAGE-XR) 500 MG 24 hr tablet Take 2 tablets (1,000 mg total) by mouth 2 (two) times daily.   Accu-Chek Softclix Lancets lancets Use as instructed   blood glucose meter kit and supplies KIT Dispense based on patient and insurance preference. Use up to four times daily as directed.   Dulaglutide (TRULICITY) 4.38 OI/7.5ZV SOPN Inject  0.75 mg into the skin once a week. (Patient not taking: Reported on 06/01/2022)   HYDROcodone-acetaminophen (NORCO/VICODIN) 5-325 MG tablet Take 1 tablet by mouth every 6 (six) hours as needed for severe pain. (Patient not taking: Reported on 06/01/2022)   Semaglutide, 1 MG/DOSE, 4 MG/3ML SOPN Inject 1 mg as directed once a week. (Patient not taking: Reported on 06/01/2022)   [DISCONTINUED] diphenhydrAMINE (BENADRYL) 25 mg capsule Take 1 capsule (25 mg total) by mouth every 6 (six) hours as needed for itching. (Patient not taking: No  sig reported)   [DISCONTINUED] metoCLOPramide (REGLAN) 10 MG tablet Take 1 tablet (10 mg total) by mouth every 6 (six) hours as needed (Nausea or headache). (Patient not taking: Reported on 10/12/2017)   [DISCONTINUED] promethazine (PHENERGAN) 25 MG tablet Take 1 tablet (25 mg total) by mouth every 6 (six) hours as needed for nausea or vomiting (headache). (Patient not taking: No sig reported)   No facility-administered encounter medications on file as of 06/01/2022.     Review of Systems  Review of Systems  Constitutional: Negative.   HENT: Negative.    Cardiovascular: Negative.   Gastrointestinal: Negative.   Allergic/Immunologic: Negative.   Neurological: Negative.   Psychiatric/Behavioral: Negative.         Physical Exam  BP 119/78   Pulse 80   Temp 97.9 F (36.6 C)   Ht _0  (1.676 m)   Wt 232 lb (105.2 kg)   SpO2 99%   BMI 37.45 kg/m   Wt Readings from Last 5 Encounters:  06/01/22 232 lb (105.2 kg)  03/01/22 235 lb (106.6 kg)  06/29/21 242 lb 3.2 oz (109.9 kg)  06/13/21 235 lb 4.8 oz (106.7 kg)  05/24/21 235 lb (106.6 kg)     Physical Exam Vitals and nursing note reviewed.  Constitutional:      General: She is not in acute distress.    Appearance: She is well-developed.  Cardiovascular:     Rate and Rhythm: Normal rate and regular rhythm.  Pulmonary:     Effort: Pulmonary effort is normal.     Breath sounds: Normal breath sounds.  Feet:     Right foot:     Toenail Condition: Right toenails are abnormally thick.     Comments: Partial toenail avulsion to bilateral great toes.  Neurological:     Mental Status: She is alert and oriented to person, place, and time.      Lab Results:  CBC    Component Value Date/Time   WBC 7.3 03/01/2022 1009   WBC 7.3 01/12/2022 1527   RBC 6.19 (H) 03/01/2022 1009   RBC 6.19 (H) 01/12/2022 1527   HGB 14.4 03/01/2022 1009   HCT 44.8 03/01/2022 1009   PLT 347 03/01/2022 1009   MCV 72 (L) 03/01/2022 1009   MCH  23.3 (L) 03/01/2022 1009   MCH 23.3 (L) 01/12/2022 1527   MCHC 32.1 03/01/2022 1009   MCHC 31.3 01/12/2022 1527   RDW 14.1 03/01/2022 1009   LYMPHSABS 3.3 05/20/2021 1017   MONOABS 0.5 05/20/2021 1017   EOSABS 0.3 05/20/2021 1017   BASOSABS 0.1 05/20/2021 1017    BMET    Component Value Date/Time   NA 138 03/01/2022 1009   K 4.4 03/01/2022 1009   CL 103 03/01/2022 1009   CO2 21 03/01/2022 1009   GLUCOSE 241 (H) 03/01/2022 1009   GLUCOSE 407 (H) 01/12/2022 1527   BUN 10 03/01/2022 1009   CREATININE 0.84 03/01/2022 1009   CREATININE 0.90 05/20/2021  1017   CALCIUM 9.4 03/01/2022 1009   GFRNONAA >60 01/12/2022 1527   GFRNONAA >60 05/20/2021 1017   GFRAA >60 10/31/2019 1713    BNP    Component Value Date/Time   BNP 12.7 01/12/2022 1527     Assessment & Plan:   Type 2 diabetes mellitus with hyperglycemia (HCC) - POCT glycosylated hemoglobin (Hb A1C) - Ambulatory referral to [facility] to follow up regarding issues with getting diabetic medications filled  2. Neuropathy  - Ambulatory referral to Podiatry  - will trial cymbalta  3. Avulsion of toenail, initial encounter  - Ambulatory referral to Podiatry  4. Bilateral foot pain  - Ambulatory referral to Podiatry    Follow up:  Follow up in 3 months or sooner if needed      Fenton Foy, NP 06/01/2022

## 2022-06-01 NOTE — Assessment & Plan Note (Signed)
-   POCT glycosylated hemoglobin (Hb A1C) - Ambulatory referral to Podiatry  -pharmacy to follow up regarding issues with getting diabetic medications filled  2. Neuropathy  - Ambulatory referral to Podiatry  - will trial cymbalta  3. Avulsion of toenail, initial encounter  - Ambulatory referral to Podiatry  4. Bilateral foot pain  - Ambulatory referral to Podiatry    Follow up:  Follow up in 3 months or sooner if needed

## 2022-06-01 NOTE — Progress Notes (Signed)
Care Coordination Call  Received message from PCP that patient is having a hard time accessing GLP1 therapy. Patient notes she completed Ozempic patient assistance application. Contacted [employer] to follow up on status of application - application is still being processed as information was not available on the automated system. Will follow up with Tresa Endo when she is back in the office.    Collaborated with pharmacy at [facility] - they will refill Trulicity for patient while we work on patient assistance.    Catie Eppie Gibson, PharmD, [facility] [phone]

## 2022-06-01 NOTE — Patient Instructions (Addendum)
1. Type 2 diabetes mellitus with hyperglycemia, without long-term current use of insulin (HCC)  - POCT glycosylated hemoglobin (Hb A1C) - Ambulatory referral to Podiatry  -pharmacy to follow up regarding issues with getting diabetic medications filled  2. Neuropathy  - Ambulatory referral to Podiatry  - will trial cymbalta  3. Avulsion of toenail, initial encounter  - Ambulatory referral to Podiatry  4. Bilateral foot pain  - Ambulatory referral to Podiatry    Follow up:  Follow up in 3 months or sooner if needed

## 2022-06-09 ENCOUNTER — Other Ambulatory Visit: Payer: Self-pay

## 2022-06-16 ENCOUNTER — Other Ambulatory Visit: Payer: Self-pay

## 2022-06-29 ENCOUNTER — Other Ambulatory Visit: Payer: Self-pay

## 2022-07-04 ENCOUNTER — Other Ambulatory Visit: Payer: Medicaid Other | Admitting: Pharmacist

## 2022-07-06 ENCOUNTER — Other Ambulatory Visit: Payer: Self-pay

## 2022-07-06 ENCOUNTER — Telehealth: Payer: Self-pay | Admitting: Family

## 2022-07-06 ENCOUNTER — Other Ambulatory Visit: Payer: Medicaid Other | Admitting: Pharmacist

## 2022-07-06 DIAGNOSIS — B353 Tinea pedis: Secondary | ICD-10-CM

## 2022-07-06 MED ORDER — TRULICITY 1.5 MG/0.5ML ~~LOC~~ SOAJ
1.5000 mg | SUBCUTANEOUS | 2 refills | Status: DC
  Filled 2022-07-06: qty 2, 28d supply, fill #0

## 2022-07-06 MED ORDER — CLOTRIMAZOLE 1 % EX CREA
1.0000 | TOPICAL_CREAM | Freq: Two times a day (BID) | CUTANEOUS | 0 refills | Status: DC
  Filled 2022-07-06: qty 60, 30d supply, fill #0

## 2022-07-06 NOTE — Progress Notes (Signed)
07/06/2022 Name: Shannon Oneal MRN: [id number] DOB: 03-04-1981  Chief Complaint  Patient presents with   Medication Management   Diabetes    Shannon Oneal is a 41 y.o. year old female who presented for a telephone visit.   They were referred to the pharmacist by their PCP for assistance in managing diabetes.   Patient is participating in a Managed Medicaid Plan:  Yes - reports she received a letter that she now has a [facility] plan, but appears it is not active  Subjective:  Care Team: Primary Care Provider: Fenton Foy, NP ; Next Scheduled Visit: 09/01/22  Medication Access/Adherence  Current Pharmacy:  [facility], [place]  [address] Phone: [phone] Fax: [facility], [place] [address] Phone: [phone] Fax: [phone]   Patient reports affordability concerns with their medications: Yes  Patient reports access/transportation concerns to their pharmacy: Yes  Patient reports adherence concerns with their medications:  No     Diabetes:  Current medications: metformin XR 1000 mg twice daily, Trulicity 0.17 mg weekly  Current glucose readings: in 200s  Reports she is tolerating Trulicity well. We had applied for Ozempic assistance from [employer], but this will not be needed if she has a Medicaid plan. She will review the letter from [facility] when she gets home and investigate next steps, will let me know if any support needed from [place].   Health Maintenance  Health Maintenance Due  Topic Date Due   FOOT EXAM  Never done   OPHTHALMOLOGY EXAM  Never done   Hepatitis C Screening  Never done   PAP SMEAR-Modifier  Never done   COVID-19 Vaccine (4 - 2023-24 season) 03/24/2022     Objective: Lab Results  Component Value Date   HGBA1C 7.6 (A) 06/01/2022    Lab Results  Component Value  Date   CREATININE 0.84 03/01/2022   BUN 10 03/01/2022   NA 138 03/01/2022   K 4.4 03/01/2022   CL 103 03/01/2022   CO2 21 03/01/2022    Lab Results  Component Value Date   CHOL 123 03/01/2022   HDL 28 (L) 03/01/2022   LDLCALC 80 03/01/2022   TRIG 74 03/01/2022   CHOLHDL 4.4 03/01/2022    Medications Reviewed Today     Reviewed by Osker Mason, RPH-CPP (Pharmacist) on 07/06/22 at 1006  Med List Status: <None>   Medication Order Taking? Sig Documenting Provider Last Dose Status Informant  Accu-Chek Softclix Lancets lancets [id number]  Use as instructed Fenton Foy, NP  Active   albuterol (VENTOLIN HFA) 108 (90 Base) MCG/ACT inhaler [id number]  Inhale 2 puffs into the lungs every 6 (six) hours as needed for wheezing or shortness of breath. Fenton Foy, NP  Active   apixaban (ELIQUIS) 5 MG TABS tablet [id number] Yes Take 1 tablet (5 mg total) by mouth 2 (two) times daily. Fenton Foy, NP Taking Active   blood glucose meter kit and supplies KIT [id number] Yes Dispense based on patient and insurance preference. Use up to four times daily as directed. Fenton Foy, NP Taking Active    Patient not taking:   Discontinued 02/05/21 1204 Dulaglutide (TRULICITY) 2.87 GO/1.1XB SOPN [id number] Yes Inject 0.75 mg into the skin once a week. Fenton Foy, NP Taking Active   glucose blood (ACCU-CHEK GUIDE) test strip [id number] No Use as  instructed  Patient not taking: Reported on 07/06/2022   Fenton Foy, NP Not Taking Active   HYDROcodone-acetaminophen (NORCO/VICODIN) 5-325 MG tablet [id number] No Take 1 tablet by mouth every 6 (six) hours as needed for severe pain.  Patient not taking: Reported on 06/01/2022   Evlyn Courier, PA-C Not Taking Active   metFORMIN (GLUCOPHAGE-XR) 500 MG 24 hr tablet [id number] Yes Take 2 tablets (1,000 mg total) by mouth 2 (two) times daily. Fenton Foy, NP Taking Active   Patient not taking:  Discontinued 10/31/19 2134  Patient not taking:    Discontinued 01/18/21 2321 Semaglutide, 1 MG/DOSE, 4 MG/3ML SOPN [id number] No Inject 1 mg as directed once a week.  Patient not taking: Reported on 06/01/2022   Fenton Foy, NP Not Taking Active             SDOH Interventions Today    Flowsheet Row Most Recent Value  SDOH Interventions   Transportation Interventions Other (Comment)  [discussed mail order]  Financial Strain Interventions Other (Comment)  [discussed medicaid]       Assessment/Plan:   Diabetes: - Currently uncontrolled - Reviewed long term cardiovascular and renal outcomes of uncontrolled blood sugar - Reviewed goal A1c, goal fasting, and goal 2 hour post prandial glucose - Recommend to increase Trulicity to 1.5 mg weekly. Discussed with PCP, she is in agreement. Counseled patient on risk of GI side effects with dose increase. Continue metformin   Will follow for Medicaid eligibility support.   Follow Up Plan: phone call in ~ 4 weeks  Catie TJodi Mourning, PharmD, [facility] [phone]

## 2022-07-06 NOTE — Progress Notes (Signed)
E-Visit for Athlete's Foot  We are sorry that you are not feeling well. Here is how we plan to help!  Based on what you shared with me it looks like you have tinea pedis, or "Athlete's Foot".  This type of rash can spread through shared towels, clothing, bedding, etc., as well as hard surfaces (particularly in moist areas) such as shower stalls, locker room floors, pool areas, etc. The symptoms of Athlete's Foot include red, swollen, peeling, itchy skin between the toes (especially between the pinky toe and the one next to it). The sole and heel of the foot may also be affected. In severe cases, the skin on the feet can blister.  Athlete's foot can usually be treated with over-the-counter topical antifungal products; but sometimes with chronic or extensive tinea pedis, prescription oral medications are needed.   I am recommending:Clotrimazole 1% cream or gel, apply to area twice per day   Prescription medications are only indicated for an extensive rash or if over the counter treatments have failed.   HOME CARE:  Keep feet clean, dry, and cool. Avoid using swimming pools, public showers, or foot baths. Wear sandals when possible or air shoes out by alternating them every 2-3 days. Avoid wearing closed shoes and wearing socks made from fabric that doesn't dry easily (for example, nylon). Treat the infection with recommended medication  GET HELP RIGHT AWAY IF:  Symptoms that don't go away after treatment. Severe itching that persists. If your rash spreads or swells. If your rash begins to have drainage or smell. You develop a fever.  MAKE SURE YOU   Understand these instructions. Will watch your condition. Will get help right away if you are not doing well or get worse.   Thank you for choosing an e-visit.  Your e-visit answers were reviewed by a board certified advanced clinical practitioner to complete your personal care plan. Depending upon the condition, your plan  could have included both over the counter or prescription medications.  Please review your pharmacy choice. Make sure the pharmacy is open so you can pick up prescription now. If there is a problem, you may contact your provider through [employer] and have the prescription routed to another pharmacy.  Your safety is important to [place]. If you have drug allergies check your prescription carefully.   For the next 24 hours you can use MyChart to ask questions about today's visit, request a non-urgent call back, or ask for a work or school excuse.  You will get an email in the next two days asking about your experience. I hope that your e-visit has been valuable and will speed your recovery  References or for more information:  FatMenus.com.[email or web address]?search=athletes%[email or web address]%20treatment&source=search_result&selectedTitle=1~104&usage_type=default&display_rank=1  [email or web address]   Approximately 5 minutes was spent documenting and reviewing patient's chart.

## 2022-07-07 ENCOUNTER — Other Ambulatory Visit: Payer: Self-pay

## 2022-07-08 IMAGING — CT CT ANGIO CHEST
2 of 7 series · 17 of 46 positions shown · IV contrast (APPLIED)
Comparison: 01/18/2021

CLINICAL DATA: Rule out pulmonary embolism.

EXAM:
CT ANGIOGRAPHY CHEST WITH CONTRAST
TECHNIQUE: Multidetector CT imaging of the chest was performed using the
standard protocol during bolus administration of intravenous
contrast. Multiplanar CT image reconstructions and MIPs were
obtained to evaluate the vascular anatomy.

[Series 6: thins · axial · 0.93mm/px · z∈[+1062,+1274]mm · 15 of 244 slices shown]
[im 16/244  lung]
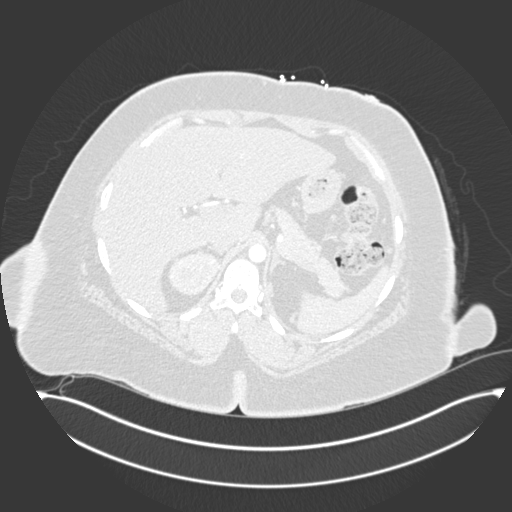
[im 31/244  soft-tissue]
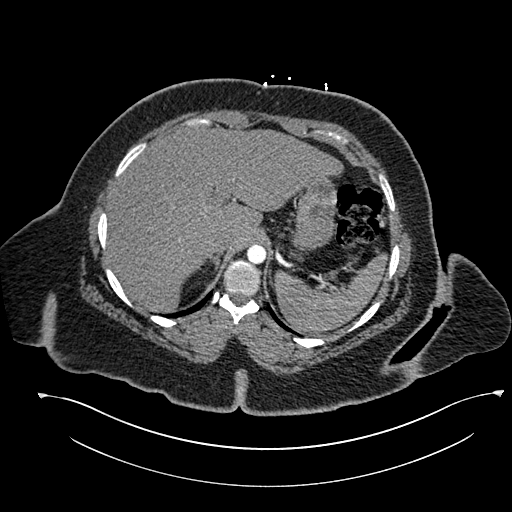
[im 46/244  lung]
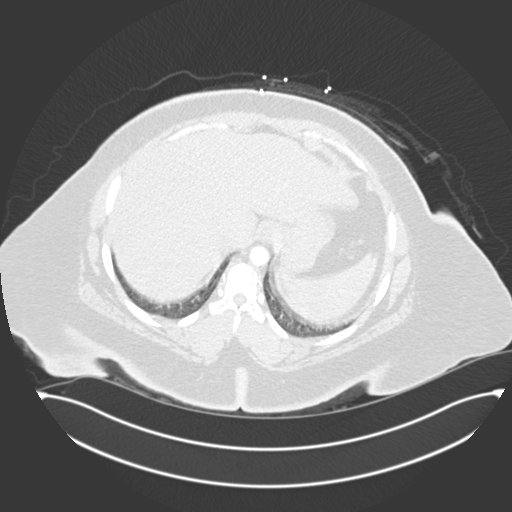
[im 61/244  soft-tissue]
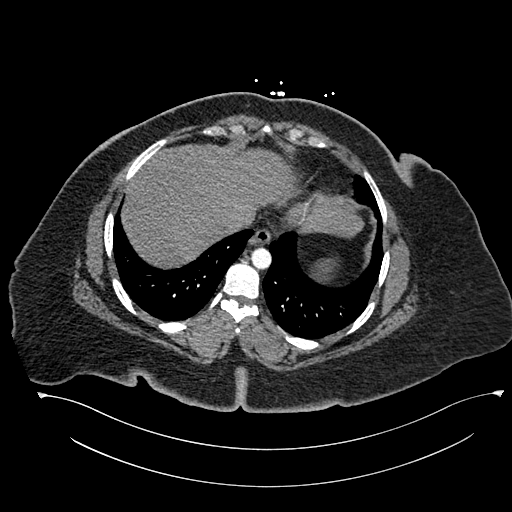
[im 76/244  lung]
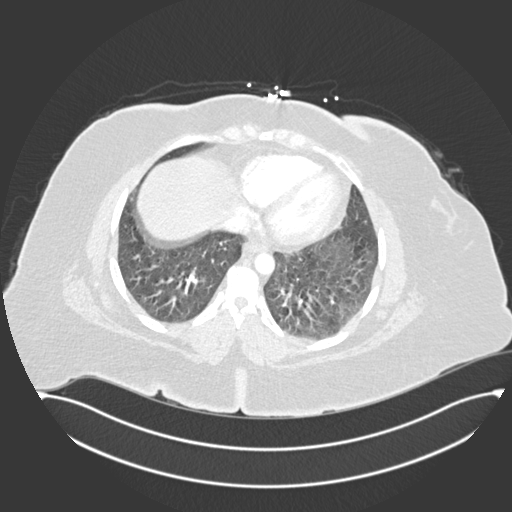
[im 92/244  soft-tissue]
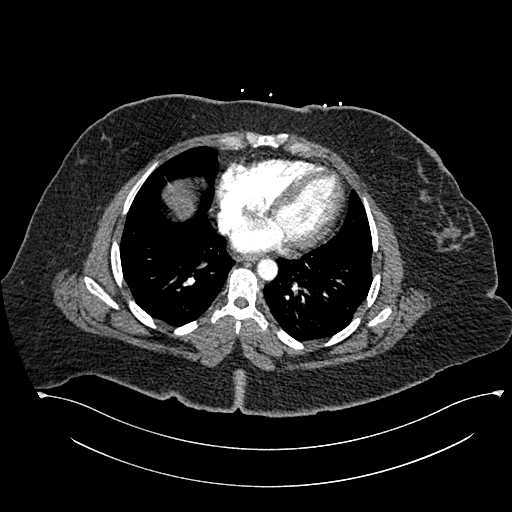
[im 107/244  lung]
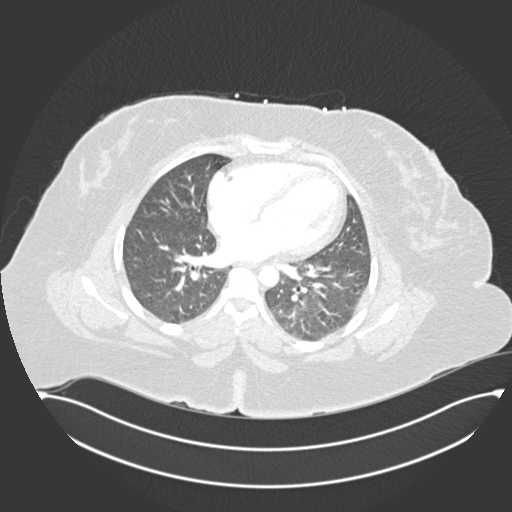
[im 122/244  soft-tissue]
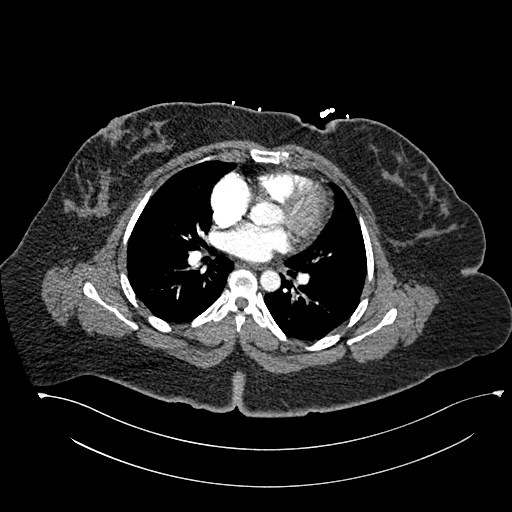
[im 137/244  lung]
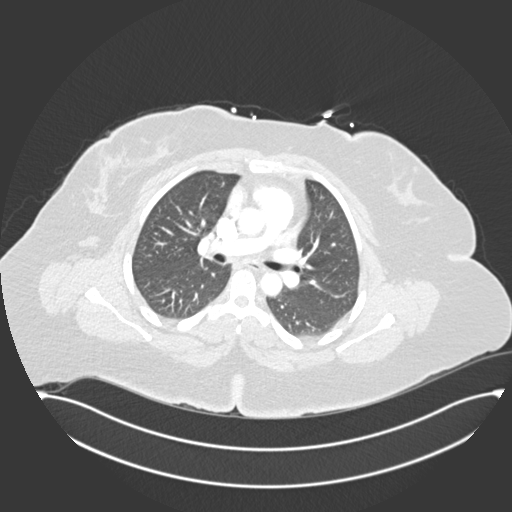
[im 152/244  soft-tissue]
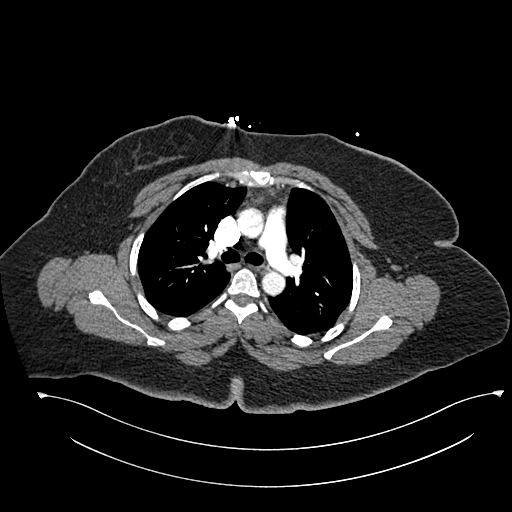
[im 168/244  lung]
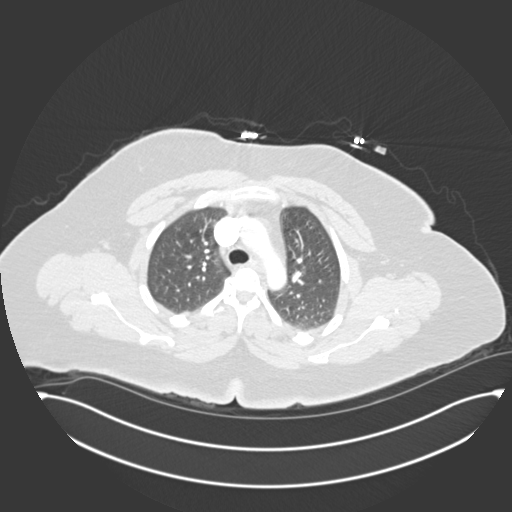
[im 183/244  soft-tissue]
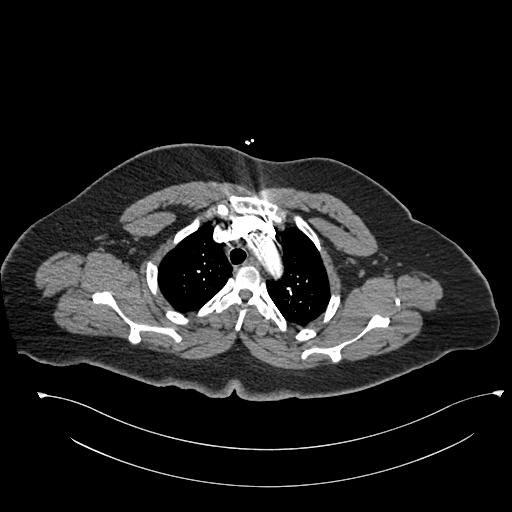
[im 198/244  lung]
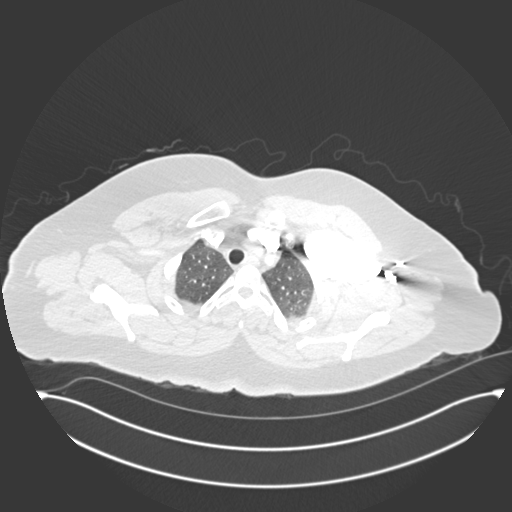
[im 213/244  soft-tissue]
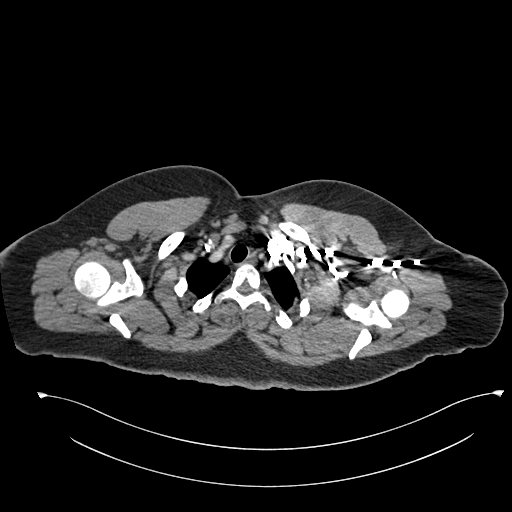
[im 228/244  lung]
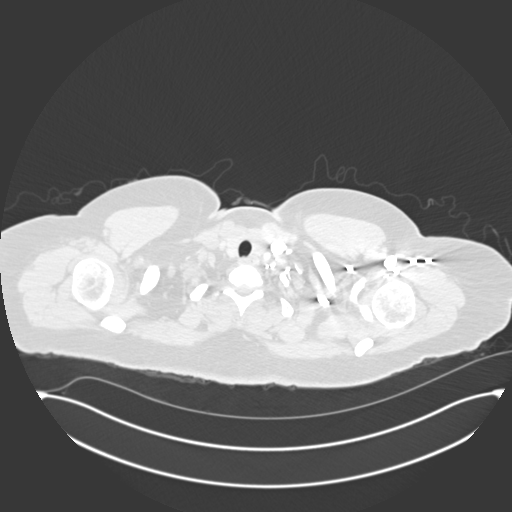

[Series 8: coronal mpr · coronal · 0.49mm/px · 2 of 106 slices shown]
[im 36/106  soft-tissue]
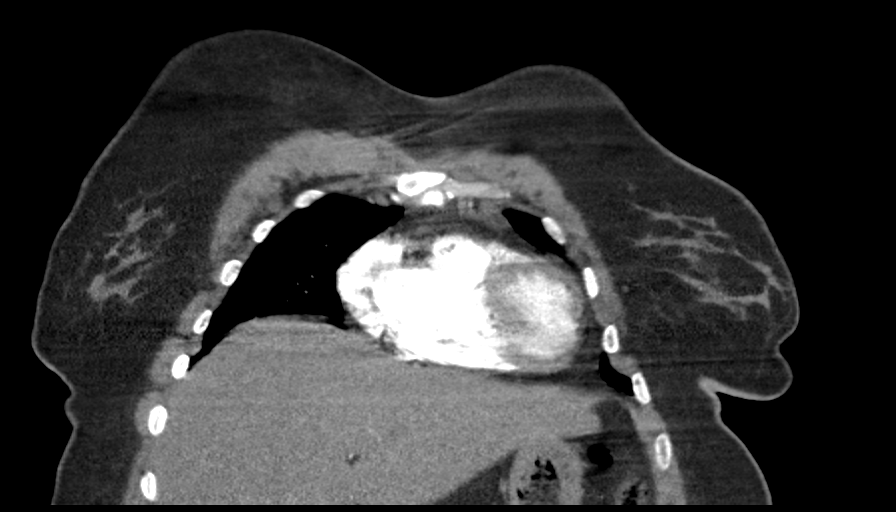
[im 71/106  soft-tissue]
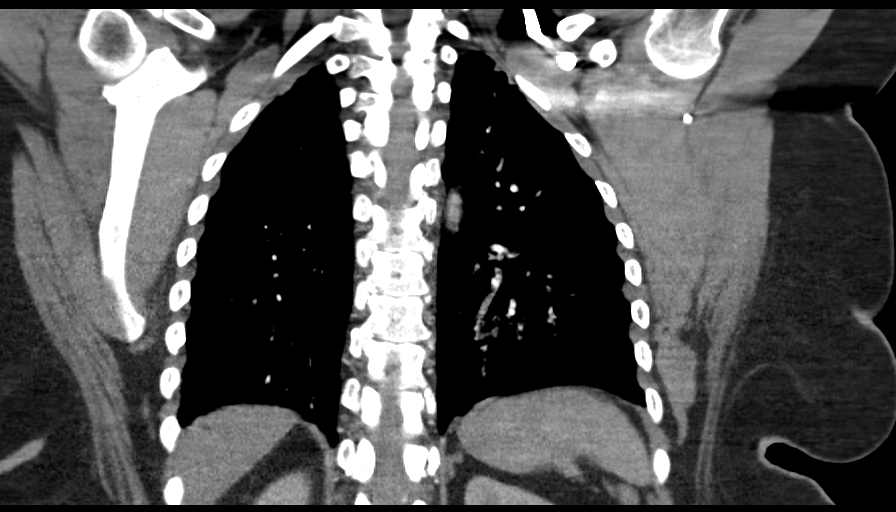

[17 of 46 positions shown; findings below may reference images not displayed]

RADIATION DOSE REDUCTION: This exam was performed according to the
departmental dose-optimization program which includes automated
exposure control, adjustment of the mA and/or kV according to
patient size and/or use of iterative reconstruction technique.

CONTRAST:  80mL OMNIPAQUE IOHEXOL 350 MG/ML SOLN
FINDINGS: Cardiovascular: Diminished exam detail (especially within the lower
lobes) secondary to respiratory motion artifact. There is
satisfactory opacification of the pulmonary arteries to the
segmental level. No signs of acute pulmonary embolus identified.

Heart size is normal.  No pericardial effusion.

Mediastinum/Nodes: No enlarged mediastinal, hilar, or axillary lymph
nodes. Thyroid gland, trachea, and esophagus demonstrate no
significant findings.

Lungs/Pleura: No pleural effusion, airspace consolidation,
atelectasis or pneumothorax. No suspicious pulmonary nodule or mass.

Upper Abdomen: No acute abnormality.

Musculoskeletal: No chest wall abnormality. No acute or significant
osseous findings.

Review of the MIP images confirms the above findings.
IMPRESSION: 1. Diminished exam detail (especially within the lower lobes)
secondary to respiratory motion artifact. Within this limitation
there is no evidence for acute pulmonary embolus.
2. No acute cardiopulmonary abnormalities identified to explain
patient's shortness of breath.

## 2022-07-08 IMAGING — CR DG CHEST 2V
2 series · 2 of 2 positions shown · non-contrast
Comparison: 12/20/2021

CLINICAL DATA: Chest pain

EXAM:
CHEST - 2 VIEW

[w chest pa]
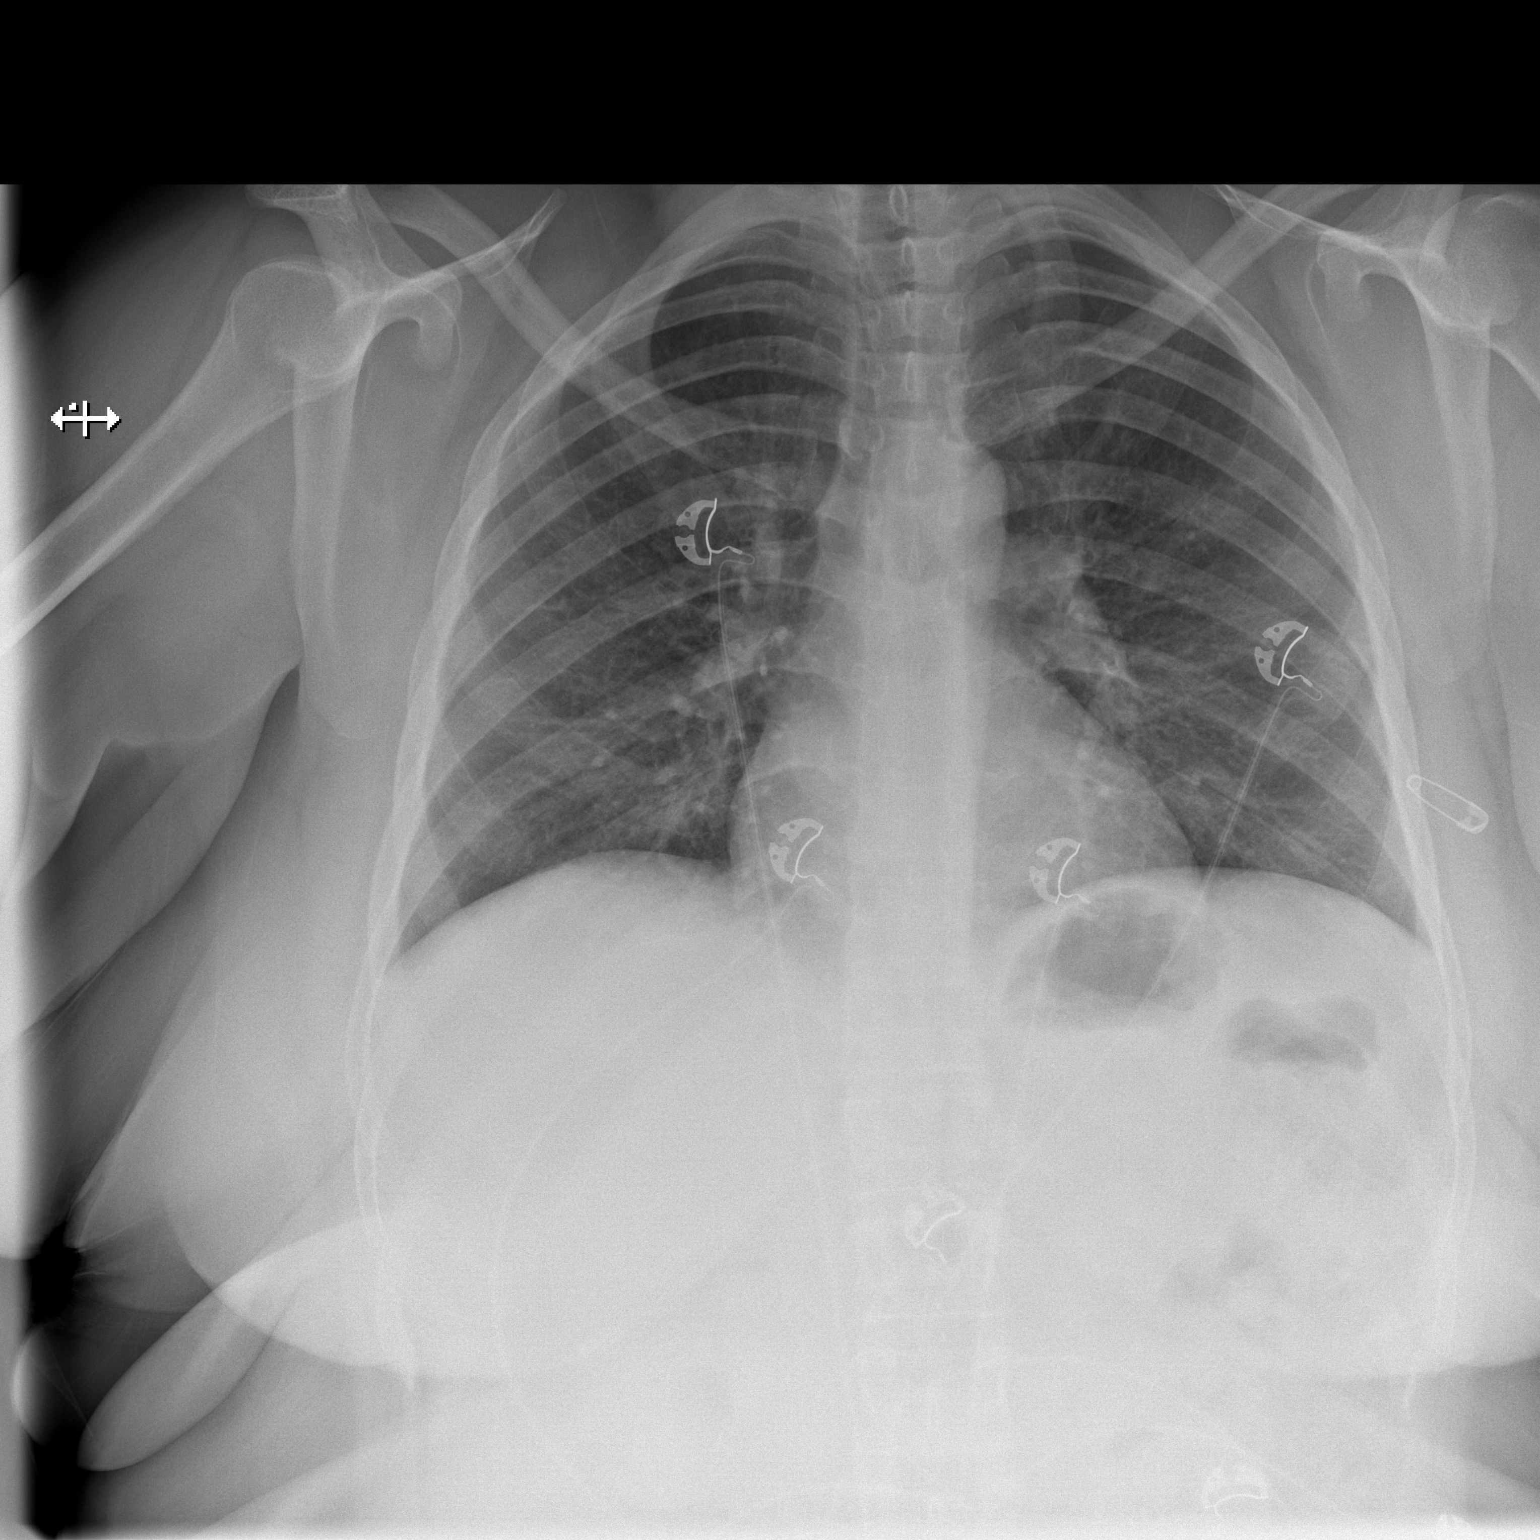

[w chest lat]
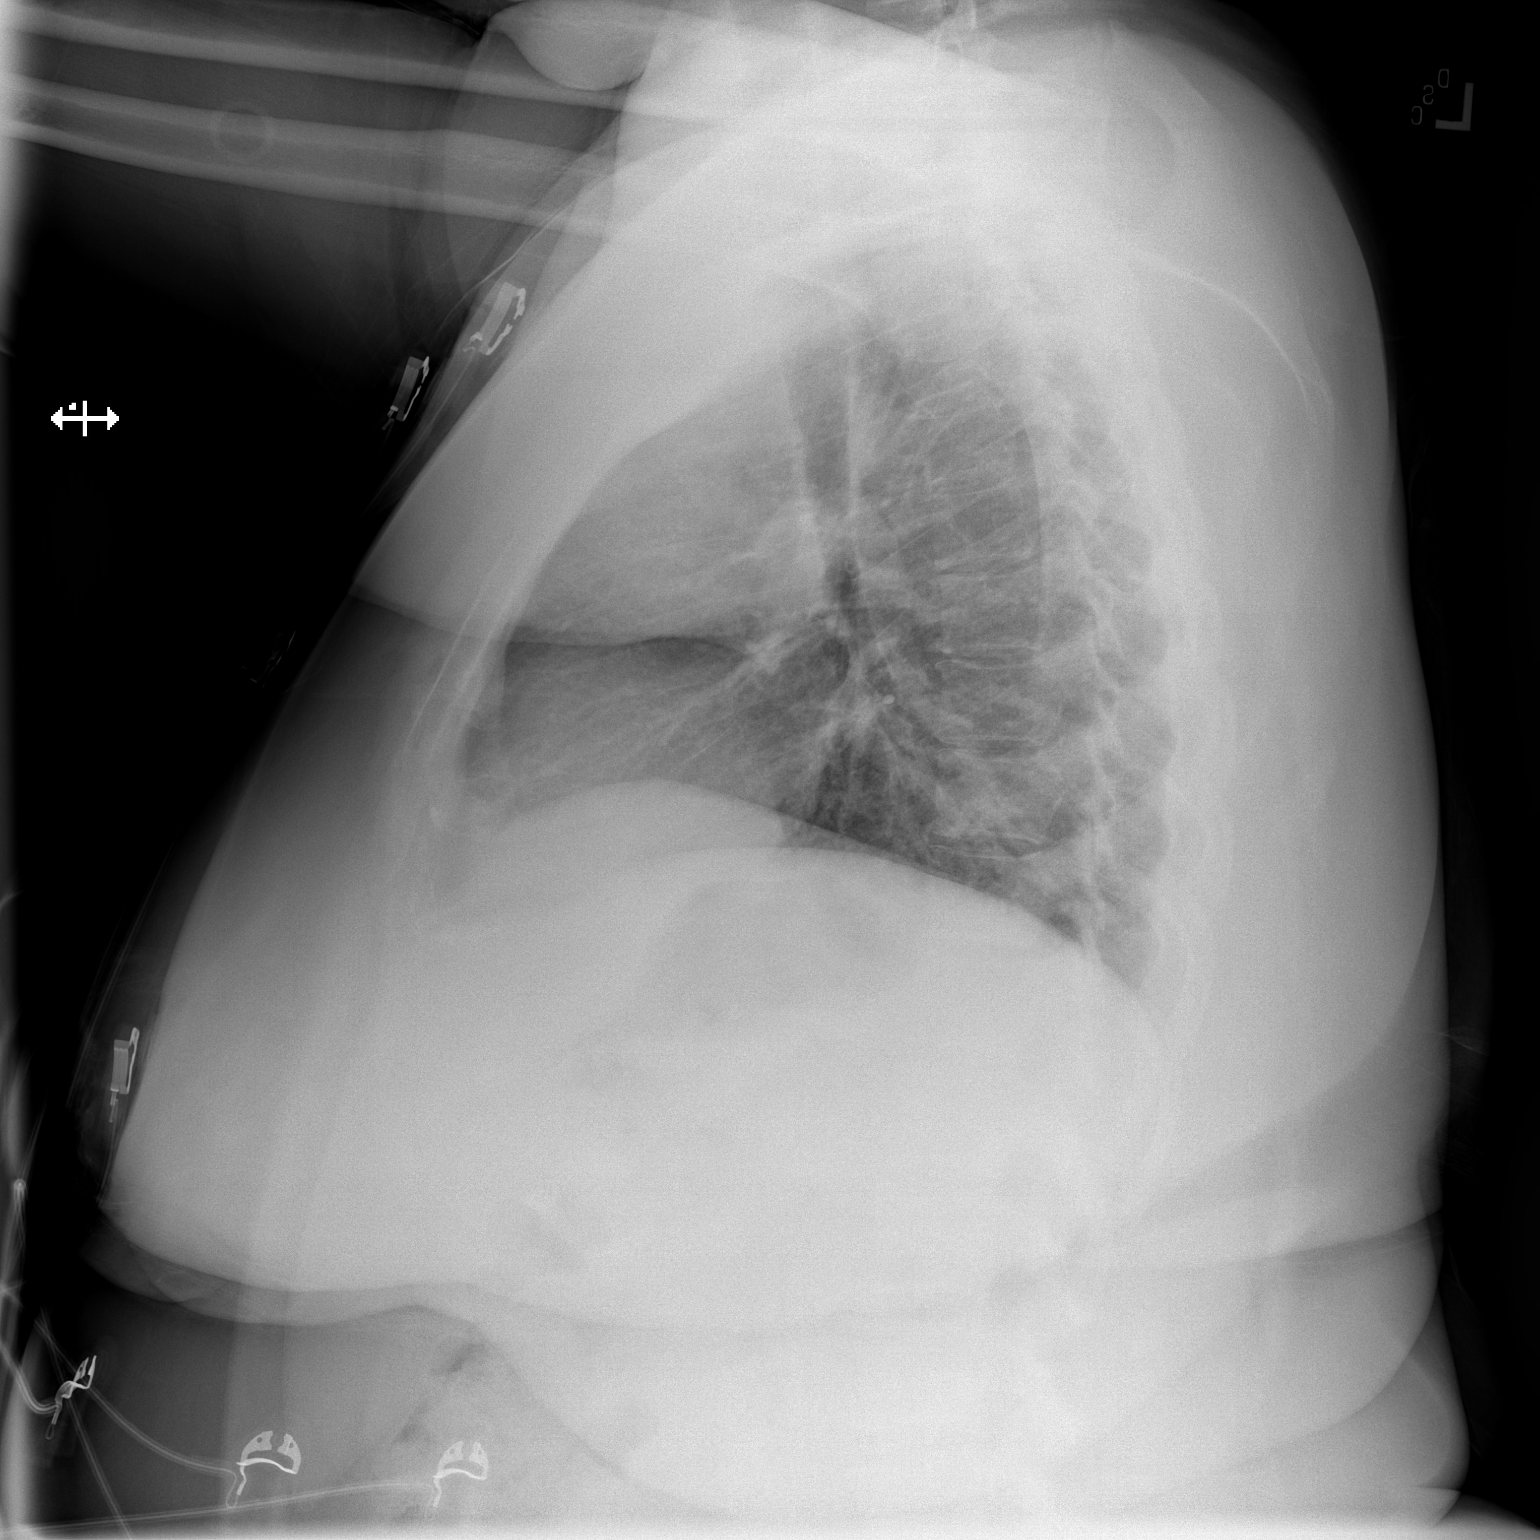

[2 of 2 positions shown; findings below may reference images not displayed]

FINDINGS: Cardiac and mediastinal contours normal. Vascularity normal. Lungs
clear without infiltrate effusion

Large cervical rib on the left unchanged. No cervical rib on the
right.
IMPRESSION: No active cardiopulmonary disease.

## 2022-07-11 ENCOUNTER — Other Ambulatory Visit: Payer: Self-pay

## 2022-07-14 ENCOUNTER — Other Ambulatory Visit: Payer: Self-pay

## 2022-07-18 ENCOUNTER — Other Ambulatory Visit: Payer: Self-pay

## 2022-07-21 ENCOUNTER — Other Ambulatory Visit: Payer: Self-pay

## 2022-08-03 ENCOUNTER — Telehealth: Payer: Self-pay | Admitting: Pharmacist

## 2022-08-03 ENCOUNTER — Other Ambulatory Visit: Payer: Medicaid Other | Admitting: Pharmacist

## 2022-08-03 NOTE — Progress Notes (Signed)
08/03/2022 Name: Shannon Oneal MRN: [id number] DOB: Aug 01, 1980  Chief Complaint  Patient presents with   Medication Management   Hypertension   Diabetes    Shannon Oneal is a 42 y.o. year old female who presented for a telephone visit. Communicated via MyChart due to patient's phone being off.   They were referred to the pharmacist by their PCP for assistance in managing diabetes.   Patient is participating in a Managed Medicaid Plan:  Yes  Subjective:  Care Team: Primary Care Provider: Fenton Foy, NP ; Next Scheduled Visit: 09-01-22  Medication Access/Adherence  Current Pharmacy:  [facility], [place] [address] Phone: [phone] Fax: [facility] [address], [place] [address] Phone: [phone] Fax: [phone]   Patient reports affordability concerns with their medications: No  Patient reports access/transportation concerns to their pharmacy: Yes  Patient reports adherence concerns with their medications:  No    Notes financial resource strain. Provided [employer] resources and referring to [facility].    Diabetes:  Current medications: metformin XR 1000 mg twice daily  Current glucose readings: reports readings are generally in the 200s. Reports some constipation, but overall tolerating medication well.    Objective: Lab Results  Component Value Date   HGBA1C 7.6 (A) 06/01/2022    Lab Results  Component Value Date   CREATININE 0.84 03/01/2022   BUN 10 03/01/2022   NA 138 03/01/2022   K 4.4 03/01/2022   CL 103 03/01/2022   CO2 21 03/01/2022    Lab Results  Component Value Date   CHOL 123 03/01/2022   HDL 28 (L) 03/01/2022   LDLCALC 80 03/01/2022   TRIG 74 03/01/2022   CHOLHDL 4.4 03/01/2022    Medications Reviewed Today     Reviewed by Sharion Balloon, FNP (Family Nurse Practitioner)  on 07/06/22 at [place] List Status: <None>   Medication Order Taking? Sig Documenting Provider Last Dose Status Informant  Accu-Chek Softclix Lancets lancets [id number] No Use as instructed Fenton Foy, NP Taking Active   albuterol (VENTOLIN HFA) 108 (90 Base) MCG/ACT inhaler [id number] No Inhale 2 puffs into the lungs every 6 (six) hours as needed for wheezing or shortness of breath. Fenton Foy, NP Taking Active   apixaban (ELIQUIS) 5 MG TABS tablet [id number] No Take 1 tablet (5 mg total) by mouth 2 (two) times daily. Fenton Foy, NP Taking Active   blood glucose meter kit and supplies KIT [id number] No Dispense based on patient and insurance preference. Use up to four times daily as directed. Fenton Foy, NP Taking Active   Patient not taking:  Discontinued 02/05/21 1204 Dulaglutide (TRULICITY) 1.5 BD/5.3GD SOPN [id number]  Inject 1.5 mg into the skin once a week. Fenton Foy, NP  Active   glucose blood (ACCU-CHEK GUIDE) test strip [id number] No Use as instructed  Patient not taking: Reported on 07/06/2022   Fenton Foy, NP Not Taking Active   HYDROcodone-acetaminophen (NORCO/VICODIN) 5-325 MG tablet [id number] No Take 1 tablet by mouth every 6 (six) hours as needed for severe pain.  Patient not taking: Reported on 06/01/2022   Evlyn Courier, PA-C Not Taking Active   metFORMIN (GLUCOPHAGE-XR) 500 MG 24 hr tablet [id number] No Take 2 tablets (1,000 mg total) by mouth 2 (two) times daily. Fenton Foy, NP Taking Active   Patient not taking:  Discontinued 10/31/19 2134 Patient not taking:  Discontinued 01/18/21 2321             Assessment/Plan:   Diabetes: - Currently uncontrolled, but improving - Recommend to increase Trulicity to 3 mg weekly. Discussed with patient - Counseled on fruits, fiber, and physical activity to improve constipation; use of OTC fiber if continued issues.  - Recommend to check glucose twice daily, fasting and 2 hour post prandial.      Follow Up Plan: 4 weeks  Catie Hedwig Morton, PharmD, [facility] [phone]

## 2022-08-03 NOTE — Progress Notes (Signed)
Attempted to contact patient for scheduled appointment for medication management. Left HIPAA compliant message for patient to return my call at their convenience.   Catie Hedwig Morton, PharmD, [facility] [phone]

## 2022-08-04 ENCOUNTER — Other Ambulatory Visit: Payer: Self-pay | Admitting: Nurse Practitioner

## 2022-08-07 ENCOUNTER — Other Ambulatory Visit: Payer: Self-pay

## 2022-08-07 MED ORDER — TRULICITY 3 MG/0.5ML ~~LOC~~ SOAJ
3.0000 mg | SUBCUTANEOUS | 2 refills | Status: DC
  Filled 2022-08-07: qty 2, 28d supply, fill #0

## 2022-08-08 ENCOUNTER — Other Ambulatory Visit: Payer: Self-pay

## 2022-08-09 ENCOUNTER — Emergency Department (HOSPITAL_COMMUNITY): Payer: Medicaid Other

## 2022-08-09 ENCOUNTER — Emergency Department (HOSPITAL_COMMUNITY)
Admission: EM | Admit: 2022-08-09 | Discharge: 2022-08-10 | Disposition: A | Discharge status: Home / Self Care | Payer: Medicaid Other | Attending: Emergency Medicine | Admitting: Emergency Medicine

## 2022-08-09 ENCOUNTER — Encounter (HOSPITAL_COMMUNITY): Payer: Self-pay | Admitting: *Deleted

## 2022-08-09 ENCOUNTER — Other Ambulatory Visit: Payer: Self-pay

## 2022-08-09 ENCOUNTER — Other Ambulatory Visit: Payer: Self-pay | Admitting: Nurse Practitioner

## 2022-08-09 ENCOUNTER — Encounter: Payer: Self-pay | Admitting: Neurology

## 2022-08-09 DIAGNOSIS — Z1152 Encounter for screening for COVID-19: Secondary | ICD-10-CM | POA: Diagnosis not present

## 2022-08-09 DIAGNOSIS — M79671 Pain in right foot: Secondary | ICD-10-CM | POA: Insufficient documentation

## 2022-08-09 DIAGNOSIS — R519 Headache, unspecified: Secondary | ICD-10-CM | POA: Diagnosis not present

## 2022-08-09 DIAGNOSIS — R0602 Shortness of breath: Secondary | ICD-10-CM | POA: Diagnosis not present

## 2022-08-09 DIAGNOSIS — G629 Polyneuropathy, unspecified: Secondary | ICD-10-CM

## 2022-08-09 DIAGNOSIS — Z7901 Long term (current) use of anticoagulants: Secondary | ICD-10-CM | POA: Insufficient documentation

## 2022-08-09 DIAGNOSIS — M79672 Pain in left foot: Secondary | ICD-10-CM | POA: Insufficient documentation

## 2022-08-09 DIAGNOSIS — G43809 Other migraine, not intractable, without status migrainosus: Secondary | ICD-10-CM

## 2022-08-09 LAB — I-STAT CHEM 8, ED
BUN: 12 mg/dL (ref 6–20)
Calcium, Ion: 1.21 mmol/L (ref 1.15–1.40)
Chloride: 104 mmol/L (ref 98–111)
Creatinine, Ser: 0.9 mg/dL (ref 0.44–1.00)
Glucose, Bld: 108 mg/dL — ABNORMAL HIGH (ref 70–99)
HCT: 45 % (ref 36.0–46.0)
Hemoglobin: 15.3 g/dL — ABNORMAL HIGH (ref 12.0–15.0)
Potassium: 4.3 mmol/L (ref 3.5–5.1)
Sodium: 141 mmol/L (ref 135–145)
TCO2: 28 mmol/L (ref 22–32)

## 2022-08-09 LAB — COMPREHENSIVE METABOLIC PANEL
ALT: 23 U/L (ref 0–44)
AST: 23 U/L (ref 15–41)
Albumin: 3.6 g/dL (ref 3.5–5.0)
Alkaline Phosphatase: 43 U/L (ref 38–126)
Anion gap: 7 (ref 5–15)
BUN: 11 mg/dL (ref 6–20)
CO2: 26 mmol/L (ref 22–32)
Calcium: 8.9 mg/dL (ref 8.9–10.3)
Chloride: 105 mmol/L (ref 98–111)
Creatinine, Ser: 0.97 mg/dL (ref 0.44–1.00)
GFR, Estimated: >60 mL/min (ref >60)
Glucose, Bld: 109 mg/dL — ABNORMAL HIGH (ref 70–99)
Potassium: 3.9 mmol/L (ref 3.5–5.1)
Sodium: 138 mmol/L (ref 135–145)
Total Bilirubin: 0.7 mg/dL (ref 0.3–1.2)
Total Protein: 8.1 g/dL (ref 6.5–8.1)

## 2022-08-09 LAB — CBC WITH DIFFERENTIAL/PLATELET
Abs Immature Granulocytes: 0.01 10*3/uL (ref 0.00–0.07)
Basophils Absolute: 0 10*3/uL (ref 0.0–0.1)
Basophils Relative: 1 %
Eosinophils Absolute: 0.1 10*3/uL (ref 0.0–0.5)
Eosinophils Relative: 2 %
HCT: 43.8 % (ref 36.0–46.0)
Hemoglobin: 13.5 g/dL (ref 12.0–15.0)
Immature Granulocytes: 0 %
Lymphocytes Relative: 48 %
Lymphs Abs: 2.9 10*3/uL (ref 0.7–4.0)
MCH: 23 pg — ABNORMAL LOW (ref 26.0–34.0)
MCHC: 30.8 g/dL (ref 30.0–36.0)
MCV: 74.5 fL — ABNORMAL LOW (ref 80.0–100.0)
Monocytes Absolute: 0.4 10*3/uL (ref 0.1–1.0)
Monocytes Relative: 6 %
Neutro Abs: 2.6 10*3/uL (ref 1.7–7.7)
Neutrophils Relative %: 43 %
Platelets: 371 10*3/uL (ref 150–400)
RBC: 5.88 MIL/uL — ABNORMAL HIGH (ref 3.87–5.11)
RDW: 13.7 % (ref 11.5–15.5)
WBC: 6 10*3/uL (ref 4.0–10.5)
nRBC: 0 % (ref 0.0–0.2)

## 2022-08-09 LAB — RESP PANEL BY RT-PCR (RSV, FLU A&B, COVID)  RVPGX2
Influenza A by PCR: NEGATIVE
Influenza B by PCR: NEGATIVE
Resp Syncytial Virus by PCR: NEGATIVE
SARS Coronavirus 2 by RT PCR: NEGATIVE

## 2022-08-09 LAB — TROPONIN I (HIGH SENSITIVITY)
Troponin I (High Sensitivity): <2 ng/L (ref <18)
Troponin I (High Sensitivity): <2 ng/L (ref <18)

## 2022-08-09 LAB — LIPASE, BLOOD: Lipase: 40 U/L (ref 11–51)

## 2022-08-09 LAB — I-STAT BETA HCG BLOOD, ED (MC, WL, AP ONLY): I-stat hCG, quantitative: <5 m[IU]/mL (ref <5)

## 2022-08-09 MED ORDER — KETOROLAC TROMETHAMINE 15 MG/ML IJ SOLN
15.0000 mg | Freq: Once | INTRAMUSCULAR | Status: AC
  Administered 2022-08-10: 15 mg via INTRAVENOUS
  Filled 2022-08-09: qty 1

## 2022-08-09 MED ORDER — DIPHENHYDRAMINE HCL 25 MG PO CAPS
25.0000 mg | ORAL_CAPSULE | Freq: Once | ORAL | Status: AC
  Administered 2022-08-10: 25 mg via ORAL
  Filled 2022-08-09: qty 1

## 2022-08-09 MED ORDER — METOCLOPRAMIDE HCL 5 MG/ML IJ SOLN
10.0000 mg | Freq: Once | INTRAMUSCULAR | Status: AC
  Administered 2022-08-10: 10 mg via INTRAVENOUS
  Filled 2022-08-09: qty 2

## 2022-08-09 NOTE — ED Provider Notes (Signed)
[facility] DEPT Provider Note   CSN: [id number] Arrival date & time: 08/09/22  1138     History  Chief Complaint  Patient presents with   Headache   Foot Pain   Shortness of Breath    Shannon Oneal is a 42 y.o. female.  HPI   Medical history including diabetes, PE, DVT, currently on Eliquis presents multiple complaints.   main complaint is a headache, headache started about 3 days ago, states she feels in the back of her head and radiates to the right side of her head, pain remained constant, has associated photophobia increase in severe to noise, states she will have intermittent change in vision which has since resolved, denies any current weakness at this time, does endorse some paresthesias but this is just in her feet bilaterally states she has history of neuropathy and feels is likely that does not radiate up into her legs has none in her arms or hands.  Denies any head trauma,  states that she had history of migraines and states this feels like her typical migraine but she does feel slightly worse than usual.  She does note that she has been having some decreased strength in her hands, this is intermittent, will occasionally drop things currently has none at this time.   She states that she feels some chest pressure, chest pressure has been on for last 3 days as well, states she feels slightly short of breath denies actual  pleuritic chest pain, she states it feels kind of similar to when she had a PE.  States she has not missed any doses of her anticoagulation has any cough congestion general body aches no recent travels no long immobilizations.    Home Medications Prior to Admission medications   Medication Sig Start Date End Date Taking? Authorizing Provider  cyclobenzaprine (FLEXERIL) 10 MG tablet Take 1 tablet (10 mg total) by mouth 2 (two) times daily as needed for muscle spasms. 08/10/22  Yes Marcello Fennel, PA-C  Accu-Chek Softclix Lancets  lancets Use as instructed 03/01/22   Fenton Foy, NP  albuterol (VENTOLIN HFA) 108 (90 Base) MCG/ACT inhaler Inhale 2 puffs into the lungs every 6 (six) hours as needed for wheezing or shortness of breath. 01/26/22   Fenton Foy, NP  apixaban (ELIQUIS) 5 MG TABS tablet Take 1 tablet (5 mg total) by mouth 2 (two) times daily. 03/01/22   Fenton Foy, NP  blood glucose meter kit and supplies KIT Dispense based on patient and insurance preference. Use up to four times daily as directed. 03/01/22   Fenton Foy, NP  clotrimazole (LOTRIMIN) 1 % cream Apply 1 Application topically 2 (two) times daily. 07/06/22   Hawks, Alyse Low A, FNP  Dulaglutide (TRULICITY) 3 ZM/6.2HU SOPN Inject 3 mg as directed once a week. 08/07/22   Fenton Foy, NP  glucose blood (ACCU-CHEK GUIDE) test strip Use as instructed Patient not taking: Reported on 07/06/2022 03/01/22   Fenton Foy, NP  HYDROcodone-acetaminophen (NORCO/VICODIN) 5-325 MG tablet Take 1 tablet by mouth every 6 (six) hours as needed for severe pain. Patient not taking: Reported on 06/01/2022 01/12/22   Evlyn Courier, PA-C  metFORMIN (GLUCOPHAGE-XR) 500 MG 24 hr tablet Take 2 tablets (1,000 mg total) by mouth 2 (two) times daily. 03/31/22   Fenton Foy, NP  diphenhydrAMINE (BENADRYL) 25 mg capsule Take 1 capsule (25 mg total) by mouth every 6 (six) hours as needed for itching. Patient not taking: No  sig reported 01/22/21 02/05/21  Noralee Stain, DO  metoCLOPramide (REGLAN) 10 MG tablet Take 1 tablet (10 mg total) by mouth every 6 (six) hours as needed (Nausea or headache). Patient not taking: Reported on 10/12/2017 02/18/13 10/31/19  Dione Booze, MD  promethazine (PHENERGAN) 25 MG tablet Take 1 tablet (25 mg total) by mouth every 6 (six) hours as needed for nausea or vomiting (headache). Patient not taking: No sig reported 06/04/20 01/18/21  Charlynne Pander, MD      Allergies    Amoxapine and related, Amoxicillin, Oxycodone, Percocet  [oxycodone-acetaminophen], Morphine, Rocephin [ceftriaxone], and Tramadol    Review of Systems   Review of Systems  Constitutional:  Negative for chills and fever.  Respiratory:  Positive for shortness of breath.   Cardiovascular:  Positive for chest pain.  Gastrointestinal:  Negative for abdominal pain.  Neurological:  Positive for numbness and headaches.    Physical Exam Updated Vital Signs BP 115/87   Pulse 79   Temp 97.8 F (36.6 C) (Oral)   Resp 18   Ht 5' 5.5" (1.664 m)   Wt 106.6 kg   SpO2 100%   BMI 38.51 kg/m  Physical Exam Vitals and nursing note reviewed.  Constitutional:      General: She is not in acute distress.    Appearance: She is not ill-appearing.  HENT:     Head: Normocephalic and atraumatic.     Nose: No congestion.  Eyes:     Conjunctiva/sclera: Conjunctivae normal.  Cardiovascular:     Rate and Rhythm: Normal rate and regular rhythm.     Pulses: Normal pulses.     Heart sounds: No murmur heard.    No friction rub. No gallop.  Pulmonary:     Effort: No respiratory distress.     Breath sounds: No wheezing, rhonchi or rales.     Comments: No evidence of respiratory distress nontachypneic nonhypoxic, speaking full sentences, lung sounds are clear bilaterally. Musculoskeletal:     Comments: No calf tenderness no unilateral leg swelling no palpable cords.  She is moving her upper and lower extremities out difficulty..  Physical exam of the feet were benign, there is no evidence of ulceration, no evidence of limb ischemia, she has generalized tenderness on her feet, does not focalized, feet were warm to the touch, 2+ dorsal pedal pulses, 2-second capillary refill sensation is intact to light touch  Skin:    General: Skin is warm and dry.  Neurological:     Mental Status: She is alert.     GCS: GCS eye subscore is 4. GCS verbal subscore is 5. GCS motor subscore is 6.     Motor: No weakness.     Coordination: Romberg sign negative. Finger-Nose-Finger  Test normal.     Comments: Cranial nerves II through XII grossly intact no difficulty with word finding following two-step commands there is no unilateral weakness present.  Psychiatric:        Mood and Affect: Mood normal.     ED Results / Procedures / Treatments   Labs (all labs ordered are listed, but only abnormal results are displayed) Labs Reviewed  COMPREHENSIVE METABOLIC PANEL - Abnormal; Notable for the following components:      Result Value   Glucose, Bld 109 (*)    All other components within normal limits  CBC WITH DIFFERENTIAL/PLATELET - Abnormal; Notable for the following components:   RBC 5.88 (*)    MCV 74.5 (*)    MCH 23.0 (*)  All other components within normal limits  I-STAT CHEM 8, ED - Abnormal; Notable for the following components:   Glucose, Bld 108 (*)    Hemoglobin 15.3 (*)    All other components within normal limits  RESP PANEL BY RT-PCR (RSV, FLU A&B, COVID)  RVPGX2  LIPASE, BLOOD  I-STAT BETA HCG BLOOD, ED ([facility], [facility], [facility] ONLY)  TROPONIN I (HIGH SENSITIVITY)  TROPONIN I (HIGH SENSITIVITY)    EKG EKG Interpretation  Date/Time:  Wednesday August 09 2022 11:56:55 EST Ventricular Rate:  85 PR Interval:  122 QRS Duration: 70 QT Interval:  364 QTC Calculation: 433 R Axis:   -33 Text Interpretation: Normal sinus rhythm Left axis deviation When compared with ECG of 12-Jan-2022 15:03, No significant change since last tracing Confirmed by Linwood Dibbles [phone]) on 08/09/2022 12:00:34 PM  Radiology CT Angio Chest PE W and/or Wo Contrast  Result Date: 08/10/2022 CLINICAL DATA:  Shortness of breath for several days EXAM: CT ANGIOGRAPHY CHEST WITH CONTRAST TECHNIQUE: Multidetector CT imaging of the chest was performed using the standard protocol during bolus administration of intravenous contrast. Multiplanar CT image reconstructions and MIPs were obtained to evaluate the vascular anatomy. RADIATION DOSE REDUCTION: This exam was performed according to the  departmental dose-optimization program which includes automated exposure control, adjustment of the mA and/or kV according to patient size and/or use of iterative reconstruction technique. CONTRAST:  76mL OMNIPAQUE IOHEXOL 350 MG/ML SOLN COMPARISON:  Chest x-ray from earlier in the same day. FINDINGS: Cardiovascular: Thoracic aorta is within normal limits. No aneurysmal dilatation or dissection is noted. No cardiac enlargement is seen. Pulmonary artery shows a normal branching pattern bilaterally. No filling defect to suggest pulmonary embolism is noted. Mediastinum/Nodes: Thoracic inlet is within normal limits. No hilar or mediastinal adenopathy is noted. Esophagus is within normal limits. Lungs/Pleura: Lungs are clear. No pleural effusion or pneumothorax. Upper Abdomen: Visualized upper abdomen is within normal limits. Musculoskeletal: No acute bony abnormality is noted. Review of the MIP images confirms the above findings. IMPRESSION: No evidence of pulmonary emboli.  No acute abnormality noted. Electronically Signed   By: Alcide Clever M.D.   On: 08/10/2022 00:20   DG Chest 2 View  Result Date: 08/09/2022 CLINICAL DATA:  Chest pain and shortness of breath EXAM: CHEST - 2 VIEW COMPARISON:  Chest x-ray January 12, 2022 FINDINGS: The cardiomediastinal silhouette is unchanged in contour. No focal pulmonary opacity. No pleural effusion or pneumothorax. The visualized upper abdomen is unremarkable. No acute osseous abnormality. IMPRESSION: No acute cardiopulmonary abnormality. Electronically Signed   By: Jacob Moores M.D.   On: 08/09/2022 13:10   CT Head Wo Contrast  Result Date: 08/09/2022 CLINICAL DATA:  Provided history: Headache, sudden, severe. Additional history provided: The patient reports a history of migraine headaches. EXAM: CT HEAD WITHOUT CONTRAST TECHNIQUE: Contiguous axial images were obtained from the base of the skull through the vertex without intravenous contrast. RADIATION DOSE REDUCTION:  This exam was performed according to the departmental dose-optimization program which includes automated exposure control, adjustment of the mA and/or kV according to patient size and/or use of iterative reconstruction technique. COMPARISON:  CT angiogram head 06/02/2020. FINDINGS: Brain: Cerebral volume is normal. Partially empty sella turcica. There is no acute intracranial hemorrhage. No demarcated cortical infarct. No extra-axial fluid collection. No evidence of an intracranial mass. No midline shift. Vascular: No hyperdense vessel Skull: No fracture or aggressive osseous lesion. Sinuses/Orbits: No mass or acute finding within the imaged orbits. No significant paranasal sinus disease at  the imaged levels. IMPRESSION: No evidence of acute intracranial hemorrhage, acute infarct or intracranial mass. Partially empty sella turcica. This finding can reflect incidental anatomic variation, or alternatively, it can be associated with idiopathic intracranial hypertension (pseudotumor cerebri). Electronically Signed   By: Kellie Simmering D.O.   On: 08/09/2022 13:03    Procedures Procedures    Medications Ordered in ED Medications  ketorolac (TORADOL) 15 MG/ML injection 15 mg (15 mg Intravenous Given 08/10/22 0000)  metoCLOPramide (REGLAN) injection 10 mg (10 mg Intravenous Given 08/10/22 0001)  diphenhydrAMINE (BENADRYL) capsule 25 mg (25 mg Oral Given 08/10/22 0003)  sodium chloride (PF) 0.9 % injection (  Given by Other 08/10/22 0008)  iohexol (OMNIPAQUE) 350 MG/ML injection 75 mL (75 mLs Intravenous Contrast Given 08/10/22 0010)  fentaNYL (SUBLIMAZE) injection 50 mcg (50 mcg Intravenous Given 08/10/22 0236)    ED Course/ Medical Decision Making/ A&P                             Medical Decision Making Amount and/or Complexity of Data Reviewed Radiology: ordered.  Risk Prescription drug management.   This patient presents to the ED for concern of multiple complaints, this involves an extensive number  of treatment options, and is a complaint that carries with it a high risk of complications and morbidity.  The differential diagnosis includes PE, ACS, pneumonia, CVA, intracranial bleed, migraines    Additional history obtained:  Additional history obtained from N/A External records from outside source obtained and reviewed including oncology , internal medicine notes   Co morbidities that complicate the patient evaluation  On anticoag's  Social Determinants of Health:  N/A    Lab Tests:  I Ordered, and personally interpreted labs.  The pertinent results include: CBC is unremarkable, CMP reveals a glucose of 109, lipase is 40, negative delta troponin, respiratory panel negative   Imaging Studies ordered:  I ordered imaging studies including CT head, chest x-ray, CT of chest I independently visualized and interpreted imaging which showed chest x-ray unremarkable, CT head negative I agree with the radiologist interpretation   Cardiac Monitoring:  The patient was maintained on a cardiac monitor.  I personally viewed and interpreted the cardiac monitored which showed an underlying rhythm of: Without signs of ischemia   Medicines ordered and prescription drug management:  I ordered medication including migraine cocktail I have reviewed the patients home medicines and have made adjustments as needed  Critical Interventions:  N/A   Reevaluation:  Presents with multiple complaints, triage obtain lab work imaging which I personally reviewed, they are all unremarkable, patient benign physical exam, her history seems consistent with likely migraine, will provide with a migraine cocktail, due to her history of DVTs despite anticoag use, will send down for CTA of chest rule out of possible PE.  And then reassess  Patient is reassessed states her headache has resolved, she is agreement with discharge at this time.    Consultations Obtained:  N/A    Test  Considered:  N/A   Rule out  low suspicion for internal head bleed and or mass as CT imaging is negative for acute findings.  Low suspicion for CVA she has no focal deficit present my exam.  Low suspicion for dissection of the vertebral or carotid artery as presentation atypical of etiology.  Suspicion for idiopathic intracranial hypertension is also low presentation is atypical seems more consistent with migraines especially since patient has an established migraine history.  I have low suspicion for ACS as history is atypical, EKG was sinus rhythm without signs of ischemia, patient had negative delta troponin.  Doubt PE or dissection as CT imaging is negative.  Suspicion for limb ischemia and/or cellulitis is low as no evidence of infection present my exam, no evidence of limb ischemia present during my examination.    Dispostion and problem list  After consideration of the diagnostic results and the patients response to treatment, I feel that the patent would benefit from discharge.   Headaches-suspect this is a migraine, will have her follow-up with her neurologist for further evaluation. Feet paresthesias-suspect this is neuropathy, will have her follow-up with neurology and/or podiatry for further assessment.            Final Clinical Impression(s) / ED Diagnoses Final diagnoses:  Bad headache  Pain in both feet    Rx / DC Orders ED Discharge Orders          Ordered    cyclobenzaprine (FLEXERIL) 10 MG tablet  2 times daily PRN        08/10/22 0250              Marcello Fennel, PA-C 08/10/22 5427    Fatima Blank, MD 08/10/22 [phone]

## 2022-08-09 NOTE — ED Triage Notes (Signed)
Patient with 3 day history of shortness of breath, bil foot pain, headache, and weakness in both arms/hands.  Patient is alert and oriented.  She reports no fevers.  She is complaining of nausea with headache pain.  Patient takes eloquis, metformin, and trulicity.  Patient is able to speak in full sentences.  Patient states that her hands have been weak bil, she drops items.

## 2022-08-09 NOTE — Telephone Encounter (Signed)
I do not typically prescribe amitriptyline I have placed a referral to neurology. Thanks.

## 2022-08-09 NOTE — ED Provider Triage Note (Signed)
Emergency Medicine Provider Triage Evaluation Note  Shannon Oneal , a 42 y.o. female  was evaluated in triage.  Pt complains of headache, chest pain, shortness of breath and epigastric pain.  Patient states she had a viral symptoms  weeks ago, about a week ago she started feeling weak in the upper extremities worse on the right side and she has been dropping items.  In the last 2 days she has been having "hot poker" feeling in her lower extremities bilaterally up to her knees, this pain is constant without alleviation.  Associated with right sided headache, nausea no vomiting.  No vision changes.   Also having epigastric pain after increased dose of Trulicity.  On Eliquis for previous blood clots..  Review of Systems  Per HPI  Physical Exam  BP 119/81 (BP Location: Left Arm)   Pulse 75   Temp 99.1 F (37.3 C) (Oral)   Resp 18   Ht 5' 5.5" (1.664 m)   Wt 106.6 kg   SpO2 99%   BMI 38.51 kg/m  Gen:   Awake, no distress   Resp:  Normal effort  MSK:   Moves extremities without difficulty  Other:  Cranial nerves II to XII are grossly intact.  Upper extremity strength symmetric bilaterally, no pronator drift, normal finger-to-nose.  Left lower extremity is slightly weaker compared to the right against resistance.  Regular rate and rhythm.  Lungs clear to auscultation, very mild epigastric tenderness without rigidity or guarding  Medical Decision Making  Medically screening exam initiated at 12:18 PM.  Appropriate orders placed.  Shannon Oneal was informed that the remainder of the evaluation will be completed by another provider, this initial triage assessment does not replace that evaluation, and the importance of remaining in the ED until their evaluation is complete.     Sherrill Raring, PA-C 08/09/22 1220

## 2022-08-10 ENCOUNTER — Encounter (HOSPITAL_COMMUNITY): Payer: Self-pay | Admitting: Emergency Medicine

## 2022-08-10 ENCOUNTER — Emergency Department (HOSPITAL_COMMUNITY): Payer: Medicaid Other

## 2022-08-10 MED ORDER — FENTANYL CITRATE PF 50 MCG/ML IJ SOSY
50.0000 ug | PREFILLED_SYRINGE | Freq: Once | INTRAMUSCULAR | Status: AC
  Administered 2022-08-10: 50 ug via INTRAVENOUS
  Filled 2022-08-10: qty 1

## 2022-08-10 MED ORDER — IOHEXOL 350 MG/ML SOLN
75.0000 mL | Freq: Once | INTRAVENOUS | Status: AC | PRN
  Administered 2022-08-10: 75 mL via INTRAVENOUS

## 2022-08-10 MED ORDER — CYCLOBENZAPRINE HCL 10 MG PO TABS: 10.0000 mg | ORAL_TABLET | Freq: Two times a day (BID) | ORAL | 0 refills | Status: DC | PRN

## 2022-08-10 MED ORDER — SODIUM CHLORIDE (PF) 0.9 % IJ SOLN
INTRAMUSCULAR | Status: AC
  Filled 2022-08-10: qty 50

## 2022-08-10 NOTE — Discharge Instructions (Addendum)
Headache-likely this is a migraine, recommend over-the-counter pain medications, get plenty of sleep, stay hydrated, try to decrease in stress.  I do want you to follow-up with your neurologist, your CT scan did show empty sella turica this is likely a benign finding but sometimes can be related to intracranial hypertension, I would like you to mention this to your neurologist as they may want to further evaluate this.  I have given a copy of the CT reading for your review  Foot pain-likely coming from neuropathy, would like to follow-up with the podiatrist and/or neurologist for further evaluation  Come back to the emergency department if you develop chest pain, shortness of breath, severe abdominal pain, uncontrolled nausea, vomiting, diarrhea.

## 2022-08-10 NOTE — Telephone Encounter (Signed)
Patient seen in ED yesterday.

## 2022-08-11 ENCOUNTER — Other Ambulatory Visit: Payer: Self-pay

## 2022-08-11 ENCOUNTER — Telehealth: Payer: Self-pay

## 2022-08-11 NOTE — Telephone Encounter (Signed)
Transition Care Management Follow-up Telephone Call Date of discharge and from where: 08/10/22 How have you been since you were released from the hospital? Per pt she is ok Any questions or concerns? No  Items Reviewed: Did the pt receive and understand the discharge instructions provided? Yes  Medications obtained and verified? Yes  Other? Yes  Any new allergies since your discharge? No  Dietary orders reviewed? No Do you have support at home? Yes     Follow up appointments reviewed:  PCP Hospital f/u appt confirmed? Yes  Scheduled to see Fol[place] on 1/23/2 @ 240 pm. [facility] f/u appt confirmed? No  Scheduled to see Jafa on 12/21/22 @ 7:30 am. Are transportation arrangements needed? No  If their condition worsens, is the pt aware to call PCP or go to the Emergency Dept.? Yes Was the patient provided with contact information for the PCP's office or ED? Yes Was to pt encouraged to call back with questions or concerns? Yes   Elyse Jarvis RMA

## 2022-08-15 ENCOUNTER — Other Ambulatory Visit: Payer: Self-pay

## 2022-08-15 ENCOUNTER — Inpatient Hospital Stay: Payer: Self-pay | Admitting: Nurse Practitioner

## 2022-08-16 ENCOUNTER — Other Ambulatory Visit: Payer: Self-pay

## 2022-08-17 ENCOUNTER — Other Ambulatory Visit: Payer: Self-pay | Admitting: Pharmacist

## 2022-08-18 ENCOUNTER — Other Ambulatory Visit: Payer: Self-pay

## 2022-08-18 MED ORDER — SEMAGLUTIDE (1 MG/DOSE) 4 MG/3ML ~~LOC~~ SOPN: 1.0000 mg | PEN_INJECTOR | SUBCUTANEOUS | 2 refills | Status: DC

## 2022-08-18 NOTE — Progress Notes (Signed)
Care Coordination Call  Coordinated with patient regarding medication access. Patient to pick up Ozempic.   Catie Hedwig Morton, PharmD, [place], [facility] [phone]

## 2022-08-18 NOTE — Addendum Note (Signed)
Addended by: Kaylyn Layer T on: 08/18/2022 12:55 PM   Modules accepted: Orders

## 2022-08-28 ENCOUNTER — Other Ambulatory Visit: Payer: Self-pay

## 2022-08-29 ENCOUNTER — Other Ambulatory Visit: Payer: Self-pay

## 2022-08-29 MED ORDER — SEMAGLUTIDE (1 MG/DOSE) 4 MG/3ML ~~LOC~~ SOPN
1.0000 mg | PEN_INJECTOR | SUBCUTANEOUS | 2 refills | Status: DC
  Filled 2022-08-29: qty 9, 84d supply, fill #0

## 2022-08-29 NOTE — Addendum Note (Signed)
Addended by: Kaylyn Layer T on: 08/29/2022 11:15 AM   Modules accepted: Orders

## 2022-08-31 NOTE — Progress Notes (Unsigned)
NEUROLOGY CONSULTATION NOTE  Shannon Oneal MRN: [id number] DOB: [birth year]  Referring provider: Lazaro Arms, NP Primary care provider: Lazaro Arms, NP  Reason for consult:  neuropathy, migraine  Assessment/Plan:   Migraine without aura, without status migrainosus, intractable Diabetic polyneuropathy  Migraine prevention:  Start Aimovig 175m Migraine rescue:  She will try samples of Nurtec Unfortunately, she has already tried and failed the medications I typically prescribe for neuralgia.  Recommend referral to pain management.  She declines at this time. Follow up 4-5 months.    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  History supplemented by ED and referring provider's notes.  Headache: Onset:  42years old.  Describes severe right occipital stabbing or pounding pain.  Associated with Nausea, photophobia, phonophobia, blurred vision.  Usually lasts 3 days.  They usually occur 2 times a month (total 6 headache days a month).  Triggers:  Unknown.  Relieving factors:  rest in dark room listening to relaxing white noise.   She was seen in the ED on 08/09/2022 where CT Head personally reviewed was negative.    Diabetic Polyneuropathy: Pain in legs and feet since 2022.  Initially with DVT in left leg but pain never improved.  Burning stabbing pain in the feet and legs.  Feels like "ants are crawling up and down my legs".  She has diabetes which has been uncontrolled for a while.  Last Hgb A1c from November was 7.6.  Previously used pregablin, gabapentin, duloxetine, and amitriptyline.    Past NSAIDS/analgesics:  meloxicam, lidocaine patch, morphine (itching), oxycodone (itching), naproxen Past abortive triptans:  sumatriptan Past abortive ergotamine:  none Past muscle relaxants:  Flexeril Past anti-emetic:   Reglan, Phenergan Past antihypertensive medications:  furosemide Past antidepressant medications:  Amitriptyline (drowsiness), duloxetine Past anticonvulsant medications:  gabapentin Other past therapies:  none  Current NSAIDS/analgesics:  Excedrin Migraine (ineffective) Current triptans:  none Current ergotamine:  none Current anti-emetic:  none Current muscle relaxants:  none Current Antihypertensive medications:  none Current Antidepressant medications:  none Current Anticonvulsant medications:  none Current anti-CGRP:  none Other therapy:  none Birth control:  none Other medications:  Eliquis, metformin        PAST MEDICAL HISTORY: Past Medical History:  Diagnosis Date   Acute deep vein [place] (DVT) of right lower extremity [place] [place] 01/18/2021   Acute pulmonary embolism ([facility] 01/19/2021   Asthma    Cellulitis in diabetic foot ([facility]    Diabetes mellitus without complication ([facility]    DVT (deep venous thrombosis) ([facility] 02/05/2021   DVT (deep venous thrombosis) ([facility]    PE (pulmonary thromboembolism) ([facility] 2016    PAST SURGICAL HISTORY: Past Surgical History:  Procedure Laterality Date   adenoids     CESAREAN SECTION      MEDICATIONS: Current Outpatient Medications on File Prior to Visit  Medication Sig Dispense Refill   Accu-Chek Softclix Lancets lancets Use as instructed 100 each 12   albuterol (VENTOLIN HFA) 108 (90 Base) MCG/ACT inhaler Inhale 2 puffs into the lungs every 6 (six) hours as needed for wheezing or shortness of breath. 8.5 g 0   apixaban (ELIQUIS) 5 MG TABS tablet Take 1 tablet (5 mg total) by mouth 2 (two) times daily. 60 tablet 5   blood glucose meter kit and supplies KIT Dispense based on patient and insurance preference. Use up to  four times daily as directed. 1 each 0   clotrimazole (LOTRIMIN) 1 % cream Apply 1 Application topically 2 (two) times daily. 60 g 0   cyclobenzaprine (FLEXERIL) 10 MG tablet Take 1 tablet (10 mg total) by mouth 2  (two) times daily as needed for muscle spasms. 20 tablet 0   glucose blood (ACCU-CHEK GUIDE) test strip Use as instructed (Patient not taking: Reported on 07/06/2022) 100 each 12   HYDROcodone-acetaminophen (NORCO/VICODIN) 5-325 MG tablet Take 1 tablet by mouth every 6 (six) hours as needed for severe pain. (Patient not taking: Reported on 06/01/2022) 8 tablet 0   metFORMIN (GLUCOPHAGE-XR) 500 MG 24 hr tablet Take 2 tablets (1,000 mg total) by mouth 2 (two) times daily. 360 tablet 3   Semaglutide, 1 MG/DOSE, 4 MG/3ML SOPN Inject 1 mg as directed once a week. 3 mL 2   [DISCONTINUED] diphenhydrAMINE (BENADRYL) 25 mg capsule Take 1 capsule (25 mg total) by mouth every 6 (six) hours as needed for itching. (Patient not taking: No sig reported) 30 capsule 0   [DISCONTINUED] metoCLOPramide (REGLAN) 10 MG tablet Take 1 tablet (10 mg total) by mouth every 6 (six) hours as needed (Nausea or headache). (Patient not taking: Reported on 10/12/2017) 30 tablet 0   [DISCONTINUED] promethazine (PHENERGAN) 25 MG tablet Take 1 tablet (25 mg total) by mouth every 6 (six) hours as needed for nausea or vomiting (headache). (Patient not taking: No sig reported) 12 tablet 0   No current facility-administered medications on file prior to visit.    ALLERGIES: Allergies  Allergen Reactions   Amoxapine And Related Other (See Comments)    Burns skin, hair falls out   Amoxicillin Other (See Comments)    Burns skin and hair falls out   Oxycodone Itching   Percocet [Oxycodone-Acetaminophen] Itching   Morphine Itching and Other (See Comments)    Must receive Benadryl to tolerate   Rocephin [Ceftriaxone] Swelling, Rash and Other (See Comments)    Swelling at site where received   Tramadol Itching and Other (See Comments)    Must receive Benadryl to tolerate    FAMILY HISTORY: Family History  Problem Relation Age of Onset   Deep vein thrombosis Father    Breast cancer Sister 63    Objective:  Blood pressure 110/62,  pulse 91, height 5' 3"$  (1.6 m), weight 228 lb (103.4 kg), SpO2 98 %. General: No acute distress.  Patient appears well-groomed.   Head:  Normocephalic/atraumatic Eyes:  fundi examined but not visualized Neck: supple, no paraspinal tenderness, full range of motion Back: No paraspinal tenderness Heart: regular rate and rhythm Lungs: Clear to auscultation bilaterally. Vascular: No carotid bruits. Neurological Exam: Mental status: alert and oriented to person, place, and time, speech fluent and not dysarthric, language intact. Cranial nerves: CN I: not tested CN II: pupils equal, round and reactive to light, visual fields intact CN III, IV, VI:  full range of motion, no nystagmus, no ptosis CN V: facial sensation intact. CN VII: upper and lower face symmetric CN VIII: hearing intact CN IX, X: gag intact, uvula midline CN XI: sternocleidomastoid and trapezius muscles intact CN XII: tongue midline Bulk & Tone: normal, no fasciculations. Motor:  muscle strength 5/5 throughout Sensation:  Pinprick sensation reduced from toes up to mid-shins, vibratory sensation reduced in toes Deep Tendon Reflexes:  2+ throughout,  toes downgoing.   Finger to nose testing:  Without dysmetria.   Heel to shin:  Without dysmetria.   Gait:  Normal station and  stride.  Romberg negative.    Thank you for allowing me to take part in the care of this patient.  Metta Clines, DO  CC: Shannon Arms, NP

## 2022-09-01 ENCOUNTER — Ambulatory Visit (INDEPENDENT_AMBULATORY_CARE_PROVIDER_SITE_OTHER): Payer: Medicaid Other | Admitting: Nurse Practitioner

## 2022-09-01 ENCOUNTER — Encounter: Payer: Self-pay | Admitting: Nurse Practitioner

## 2022-09-01 ENCOUNTER — Other Ambulatory Visit: Payer: Self-pay

## 2022-09-01 ENCOUNTER — Ambulatory Visit (INDEPENDENT_AMBULATORY_CARE_PROVIDER_SITE_OTHER): Payer: Medicaid Other | Admitting: Neurology

## 2022-09-01 ENCOUNTER — Encounter: Payer: Self-pay | Admitting: Neurology

## 2022-09-01 VITALS — BP 116/83 | HR 94 | Temp 97.4°F | Ht 65.5 in | Wt 229.0 lb

## 2022-09-01 VITALS — BP 110/62 | HR 91 | Ht 63.0 in | Wt 228.0 lb

## 2022-09-01 DIAGNOSIS — E1142 Type 2 diabetes mellitus with diabetic polyneuropathy: Secondary | ICD-10-CM | POA: Diagnosis not present

## 2022-09-01 DIAGNOSIS — G43119 Migraine with aura, intractable, without status migrainosus: Secondary | ICD-10-CM | POA: Diagnosis not present

## 2022-09-01 DIAGNOSIS — E1165 Type 2 diabetes mellitus with hyperglycemia: Secondary | ICD-10-CM

## 2022-09-01 DIAGNOSIS — Z794 Long term (current) use of insulin: Secondary | ICD-10-CM

## 2022-09-01 LAB — POCT GLYCOSYLATED HEMOGLOBIN (HGB A1C): Hemoglobin A1C: 6.2 % — AB (ref 4.0–5.6)

## 2022-09-01 MED ORDER — AIMOVIG 140 MG/ML ~~LOC~~ SOAJ: 140.0000 mg | SUBCUTANEOUS | 5 refills | Status: DC

## 2022-09-01 MED ORDER — METFORMIN HCL ER 500 MG PO TB24
1000.0000 mg | ORAL_TABLET | Freq: Two times a day (BID) | ORAL | 3 refills | Status: AC
  Filled 2022-09-01: qty 360, 90d supply, fill #0

## 2022-09-01 NOTE — Patient Instructions (Addendum)
1. Type 2 diabetes mellitus with hyperglycemia, without long-term current use of insulin ([place])  - Ambulatory referral to Ophthalmology - POCT glycosylated hemoglobin (Hb A1C)  Lab Results  Component Value Date   HGBA1C 6.2 (A) 09/01/2022       Follow up:  Follow up in 3 months

## 2022-09-01 NOTE — Progress Notes (Unsigned)
$@PatientT$  ID: Shannon Oneal, female    DOB: 12/13/1980, 42 y.o.   MRN: [id number]  Chief Complaint  Patient presents with   Diabetes    Follow  up    Referring provider: Fenton Foy, NP   HPI  42 year old female with history of DVT, PE, diabetes, asthma.    Patient presents today for diabetic follow-up.  Patient is compliant with meds. Her A1c is 6.2 today which is down from last visit.   Denies f/c/s, n/v/d, hemoptysis, PND, leg swelling. Denies chest pain or edema      Allergies  Allergen Reactions   Amoxapine And Related Other (See Comments)    Burns skin, hair falls out   Amoxicillin Other (See Comments)    Burns skin and hair falls out   Oxycodone Itching   Percocet [Oxycodone-Acetaminophen] Itching   Morphine Itching and Other (See Comments)    Must receive Benadryl to tolerate   Rocephin [Ceftriaxone] Swelling, Rash and Other (See Comments)    Swelling at site where received   Tramadol Itching and Other (See Comments)    Must receive Benadryl to tolerate    Immunization History  Administered Date(s) Administered   Influenza,inj,Quad PF,6+ Mos 05/12/2021   PFIZER Comirnaty(Gray Top)Covid-19 Tri-Sucrose Vaccine 11/05/2019, 11/26/2019, 11/02/2020   PNEUMOCOCCAL CONJUGATE-20 05/24/2021   Tdap 11/18/2012    Past Medical History:  Diagnosis Date   Acute deep vein thrombosis (DVT) of right lower extremity ([place]) 01/18/2021   Acute pulmonary embolism ([place]) 01/19/2021   Asthma    Cellulitis in diabetic foot ([place])    Diabetes mellitus without complication ([place])    DVT (deep venous thrombosis) ([place]) 02/05/2021   DVT (deep venous thrombosis) ([place])    PE (pulmonary thromboembolism) ([place]) 2016    Tobacco History: Social History   Tobacco Use  Smoking Status Never  Smokeless Tobacco Never   Counseling given: Not Answered   Outpatient Encounter Medications as of 09/01/2022  Medication Sig   Accu-Chek Softclix Lancets lancets Use as instructed    albuterol (VENTOLIN HFA) 108 (90 Base) MCG/ACT inhaler Inhale 2 puffs into the lungs every 6 (six) hours as needed for wheezing or shortness of breath.   apixaban (ELIQUIS) 5 MG TABS tablet Take 1 tablet (5 mg total) by mouth 2 (two) times daily.   blood glucose meter kit and supplies KIT Dispense based on patient and insurance preference. Use up to four times daily as directed.   glucose blood (ACCU-CHEK GUIDE) test strip Use as instructed   Semaglutide, 1 MG/DOSE, 4 MG/3ML SOPN Inject 1 mg as directed once a week.   [DISCONTINUED] cyclobenzaprine (FLEXERIL) 10 MG tablet Take 1 tablet (10 mg total) by mouth 2 (two) times daily as needed for muscle spasms.   [DISCONTINUED] metFORMIN (GLUCOPHAGE-XR) 500 MG 24 hr tablet Take 2 tablets (1,000 mg total) by mouth 2 (two) times daily.   clotrimazole (LOTRIMIN) 1 % cream Apply 1 Application topically 2 (two) times daily.   metFORMIN (GLUCOPHAGE-XR) 500 MG 24 hr tablet Take 2 tablets (1,000 mg total) by mouth 2 (two) times daily.   [DISCONTINUED] diphenhydrAMINE (BENADRYL) 25 mg capsule Take 1 capsule (25 mg total) by mouth every 6 (six) hours as needed for itching. (Patient not taking: No sig reported)   [DISCONTINUED] HYDROcodone-acetaminophen (NORCO/VICODIN) 5-325 MG tablet Take 1 tablet by mouth every 6 (six) hours as needed for severe pain.   [DISCONTINUED] metoCLOPramide (REGLAN) 10 MG tablet Take 1 tablet (10 mg total) by mouth every 6 (six) hours  as needed (Nausea or headache). (Patient not taking: Reported on 10/12/2017)   [DISCONTINUED] promethazine (PHENERGAN) 25 MG tablet Take 1 tablet (25 mg total) by mouth every 6 (six) hours as needed for nausea or vomiting (headache). (Patient not taking: No sig reported)   No facility-administered encounter medications on file as of 09/01/2022.     Review of Systems  Review of Systems  Constitutional: Negative.   HENT: Negative.    Cardiovascular: Negative.   Gastrointestinal: Negative.    Allergic/Immunologic: Negative.   Neurological: Negative.   Psychiatric/Behavioral: Negative.         Physical Exam  BP 116/83   Pulse 94   Temp (!) 97.4 F (36.3 C)   Ht 5' 5.5" (1.664 m)   Wt 229 lb (103.9 kg)   SpO2 100%   BMI 37.53 kg/m   Wt Readings from Last 5 Encounters:  09/01/22 228 lb (103.4 kg)  09/01/22 229 lb (103.9 kg)  08/09/22 235 lb (106.6 kg)  06/01/22 232 lb (105.2 kg)  03/01/22 235 lb (106.6 kg)     Physical Exam Vitals and nursing note reviewed.  Constitutional:      General: She is not in acute distress.    Appearance: She is well-developed.  Cardiovascular:     Rate and Rhythm: Normal rate and regular rhythm.  Pulmonary:     Effort: Pulmonary effort is normal.     Breath sounds: Normal breath sounds.  Neurological:     Mental Status: She is alert and oriented to person, place, and time.      Lab Results:  CBC    Component Value Date/Time   WBC 6.0 08/09/2022 1230   RBC 5.88 (H) 08/09/2022 1230   HGB 15.3 (H) 08/09/2022 1242   HGB 14.4 03/01/2022 1009   HCT 45.0 08/09/2022 1242   HCT 44.8 03/01/2022 1009   PLT 371 08/09/2022 1230   PLT 347 03/01/2022 1009   MCV 74.5 (L) 08/09/2022 1230   MCV 72 (L) 03/01/2022 1009   MCH 23.0 (L) 08/09/2022 1230   MCHC 30.8 08/09/2022 1230   RDW 13.7 08/09/2022 1230   RDW 14.1 03/01/2022 1009   LYMPHSABS 2.9 08/09/2022 1230   MONOABS 0.4 08/09/2022 1230   EOSABS 0.1 08/09/2022 1230   BASOSABS 0.0 08/09/2022 1230    BMET    Component Value Date/Time   NA 141 08/09/2022 1242   NA 138 03/01/2022 1009   K 4.3 08/09/2022 1242   CL 104 08/09/2022 1242   CO2 26 08/09/2022 1230   GLUCOSE 108 (H) 08/09/2022 1242   BUN 12 08/09/2022 1242   BUN 10 03/01/2022 1009   CREATININE 0.90 08/09/2022 1242   CREATININE 0.90 05/20/2021 1017   CALCIUM 8.9 08/09/2022 1230   GFRNONAA >60 08/09/2022 1230   GFRNONAA >60 05/20/2021 1017   GFRAA >60 10/31/2019 1713    BNP    Component Value Date/Time    BNP 12.7 01/12/2022 1527    ProBNP No results found for: "PROBNP"  Imaging: CT Angio Chest PE W and/or Wo Contrast  Result Date: 08/10/2022 CLINICAL DATA:  Shortness of breath for several days EXAM: CT ANGIOGRAPHY CHEST WITH CONTRAST TECHNIQUE: Multidetector CT imaging of the chest was performed using the standard protocol during bolus administration of intravenous contrast. Multiplanar CT image reconstructions and MIPs were obtained to evaluate the vascular anatomy. RADIATION DOSE REDUCTION: This exam was performed according to the departmental dose-optimization program which includes automated exposure control, adjustment of the mA and/or kV according  to patient size and/or use of iterative reconstruction technique. CONTRAST:  64m OMNIPAQUE IOHEXOL 350 MG/ML SOLN COMPARISON:  Chest x-ray from earlier in the same day. FINDINGS: Cardiovascular: Thoracic aorta is within normal limits. No aneurysmal dilatation or dissection is noted. No cardiac enlargement is seen. Pulmonary artery shows a normal branching pattern bilaterally. No filling defect to suggest pulmonary embolism is noted. Mediastinum/Nodes: Thoracic inlet is within normal limits. No hilar or mediastinal adenopathy is noted. Esophagus is within normal limits. Lungs/Pleura: Lungs are clear. No pleural effusion or pneumothorax. Upper Abdomen: Visualized upper abdomen is within normal limits. Musculoskeletal: No acute bony abnormality is noted. Review of the MIP images confirms the above findings. IMPRESSION: No evidence of pulmonary emboli.  No acute abnormality noted. Electronically Signed   By: MInez CatalinaM.D.   On: 08/10/2022 00:20   DG Chest 2 View  Result Date: 08/09/2022 CLINICAL DATA:  Chest pain and shortness of breath EXAM: CHEST - 2 VIEW COMPARISON:  Chest x-ray January 12, 2022 FINDINGS: The cardiomediastinal silhouette is unchanged in contour. No focal pulmonary opacity. No pleural effusion or pneumothorax. The visualized upper  abdomen is unremarkable. No acute osseous abnormality. IMPRESSION: No acute cardiopulmonary abnormality. Electronically Signed   By: MBeryle FlockM.D.   On: 08/09/2022 13:10   CT Head Wo Contrast  Result Date: 08/09/2022 CLINICAL DATA:  Provided history: Headache, sudden, severe. Additional history provided: The patient reports a history of migraine headaches. EXAM: CT HEAD WITHOUT CONTRAST TECHNIQUE: Contiguous axial images were obtained from the base of the skull through the vertex without intravenous contrast. RADIATION DOSE REDUCTION: This exam was performed according to the departmental dose-optimization program which includes automated exposure control, adjustment of the mA and/or kV according to patient size and/or use of iterative reconstruction technique. COMPARISON:  CT angiogram head 06/02/2020. FINDINGS: Brain: Cerebral volume is normal. Partially empty sella turcica. There is no acute intracranial hemorrhage. No demarcated cortical infarct. No extra-axial fluid collection. No evidence of an intracranial mass. No midline shift. Vascular: No hyperdense vessel Skull: No fracture or aggressive osseous lesion. Sinuses/Orbits: No mass or acute finding within the imaged orbits. No significant paranasal sinus disease at the imaged levels. IMPRESSION: No evidence of acute intracranial hemorrhage, acute infarct or intracranial mass. Partially empty sella turcica. This finding can reflect incidental anatomic variation, or alternatively, it can be associated with idiopathic intracranial hypertension (pseudotumor cerebri). Electronically Signed   By: KKellie SimmeringD.O.   On: 08/09/2022 13:03     Assessment & Plan:   Type 2 diabetes mellitus with hyperglycemia ([facility] - Ambulatory referral to Ophthalmology - POCT glycosylated hemoglobin (Hb A1C)  Lab Results  Component Value Date   HGBA1C 6.2 (A) 09/01/2022       Follow up:  Follow up in 3 months      TFenton Foy NP 09/04/2022

## 2022-09-01 NOTE — Patient Instructions (Signed)
Start Aimovig 162m every 28 days At earliest onset of migraine, take Nurtec (1 tablet daily as needed).  Let me know if you would like a prescription or try something different Follow up 4-5 months.

## 2022-09-01 NOTE — Progress Notes (Unsigned)
Medication Samples have been provided to the patient.  Drug name: Nurtec       Strength: 75 mg        Qty: 2  LOT: [id number]  Exp.Date: 09/2024  Dosing instructions: as needed  The patient has been instructed regarding the correct time, dose, and frequency of taking this medication, including desired effects and most common side effects.   Venetia Night 3:22 PM 09/01/2022

## 2022-09-04 ENCOUNTER — Encounter: Payer: Self-pay | Admitting: Nurse Practitioner

## 2022-09-04 NOTE — Assessment & Plan Note (Signed)
-   Ambulatory referral to Ophthalmology - POCT glycosylated hemoglobin (Hb A1C)  Lab Results  Component Value Date   HGBA1C 6.2 (A) 09/01/2022       Follow up:  Follow up in 3 months

## 2022-09-28 ENCOUNTER — Other Ambulatory Visit (HOSPITAL_COMMUNITY): Payer: Self-pay

## 2022-09-28 ENCOUNTER — Other Ambulatory Visit: Payer: Medicaid Other | Admitting: Pharmacist

## 2022-09-28 ENCOUNTER — Telehealth: Payer: Self-pay | Admitting: Pharmacy Technician

## 2022-09-28 NOTE — Patient Instructions (Signed)
Toniann,   It was great working with you!  We'll look into the Aimovig Prior Authorization.   Call me with any future issues!  Catie Hedwig Morton, PharmD, [place], [facility] [phone]

## 2022-09-28 NOTE — Progress Notes (Signed)
09/28/2022 Name: Shannon Oneal MRN: [id number] DOB: [birth year]  Chief Complaint  Patient presents with   Medication Management   Diabetes    Shannon Oneal is a 42 y.o. year old female who presented for a telephone visit.   They were referred to the pharmacist by their PCP for assistance in managing diabetes and medication access.   Patient is participating in a Managed Medicaid Plan:  Yes  Subjective:  Care Team: Primary Care Provider: Fenton Foy, NP ; Next Scheduled Visit: 11/30/22  Medication Access/Adherence  Current Pharmacy:  [facility], [address] [place] [address] Phone: [phone] Fax: [facility], [place] [address] Phone: [phone] Fax: [phone]   Patient reports affordability concerns with their medications: No  Patient reports access/transportation concerns to their pharmacy: No  Patient reports adherence concerns with their medications:  No     Diabetes:  Current medications: metformin XR 1000 mg twice daily, Ozempic 1 mg weekly  Reports she is tolerating Ozempic well. Some burping, but no severe stomach upset, nausea, constipation. Indicates reduction in appetite and some weight loss, which she is happy about.    Hx DVT:  Current medications: Eliquis 5 mg twice daily  Continues to receive Eliquis from [facility] until this approval expires; she is now insured   Objective:  Lab Results  Component Value Date   HGBA1C 6.2 (A) 09/01/2022    Lab Results  Component Value Date   CREATININE 0.90 08/09/2022   BUN 12 08/09/2022   NA 141 08/09/2022   K 4.3 08/09/2022   CL 104 08/09/2022   CO2 26 08/09/2022    Lab Results  Component Value Date   CHOL 123 03/01/2022   HDL 28 (L) 03/01/2022   LDLCALC 80 03/01/2022   TRIG 74 03/01/2022   CHOLHDL 4.4 03/01/2022     Medications Reviewed Today     Reviewed by Osker Mason, RPH-CPP (Pharmacist) on 09/28/22 at [facility] Status: <None>   Medication Order Taking? Sig Documenting Provider Last Dose Status Informant  Accu-Chek Softclix Lancets lancets [id number]  Use as instructed Fenton Foy, NP  Active   albuterol (VENTOLIN HFA) 108 (90 Base) MCG/ACT inhaler [id number]  Inhale 2 puffs into the lungs every 6 (six) hours as needed for wheezing or shortness of breath. Fenton Foy, NP  Active   apixaban (ELIQUIS) 5 MG TABS tablet [id number] Yes Take 1 tablet (5 mg total) by mouth 2 (two) times daily. Fenton Foy, NP Taking Active   blood glucose meter kit and supplies KIT [id number]  Dispense based on patient and insurance preference. Use up to four times daily as directed. Fenton Foy, NP  Active   clotrimazole (LOTRIMIN) 1 % cream [id number]  Apply 1 Application topically 2 (two) times daily. Sharion Balloon, FNP  Active    Patient not taking:   Discontinued 02/05/21 1204 Erenumab-aooe (AIMOVIG) 140 MG/ML SOAJ [id number] No Inject 140 mg into the skin every 28 (twenty-eight) days.  Patient not taking: Reported on 09/28/2022   Pieter Partridge, DO Not Taking Active   glucose blood (ACCU-CHEK GUIDE) test strip [id number]  Use as instructed Fenton Foy, NP  Active   metFORMIN (GLUCOPHAGE-XR) 500 MG 24 hr tablet [id number] Yes Take 2 tablets (1,000 mg total) by mouth 2 (two) times daily. Fenton Foy, NP  Taking Active    Patient not taking:   Discontinued 10/31/19 2134  Patient not taking:   Discontinued 01/18/21 2321 Semaglutide, 1 MG/DOSE, 4 MG/3ML SOPN [id number] Yes Inject 1 mg as directed once a week. Fenton Foy, NP Taking Active               Assessment/Plan:   Diabetes: - Currently controlled - Reviewed goal A1c, goal fasting, and goal 2 hour post prandial glucose - Recommend to continue current regimen   Hx DVT: - Currently appropriately managed - Recommend  to continue current regimen   Will collaborate with Pharmacy Technician team to investigate Aimovig PA  Follow Up Plan: pharmacy needs met. Follow up with PCP as scheduled  Catie Hedwig Morton, PharmD, [place], [facility] [phone]

## 2022-09-28 NOTE — Telephone Encounter (Signed)
Patient Advocate Encounter  Received notification from [facility] that prior authorization for AIMOVIG is required.   PA submitted on 3.7.24 Key [id number] Status is pending

## 2022-09-29 NOTE — Progress Notes (Signed)
You're welcome!

## 2022-11-17 ENCOUNTER — Other Ambulatory Visit: Payer: Self-pay

## 2022-11-20 ENCOUNTER — Other Ambulatory Visit: Payer: Self-pay | Admitting: Nurse Practitioner

## 2022-11-20 ENCOUNTER — Other Ambulatory Visit: Payer: Self-pay

## 2022-11-20 MED ORDER — OZEMPIC (1 MG/DOSE) 4 MG/3ML ~~LOC~~ SOPN
1.0000 mg | PEN_INJECTOR | SUBCUTANEOUS | 2 refills | Status: DC
  Filled 2022-11-20: qty 3, 28d supply, fill #0
  Filled 2022-12-19: qty 3, 28d supply, fill #1
  Filled 2023-01-14 – 2023-01-15 (×2): qty 3, 28d supply, fill #2

## 2022-11-24 ENCOUNTER — Other Ambulatory Visit: Payer: Self-pay

## 2022-11-30 ENCOUNTER — Ambulatory Visit (INDEPENDENT_AMBULATORY_CARE_PROVIDER_SITE_OTHER): Payer: Medicaid Other | Admitting: Nurse Practitioner

## 2022-11-30 VITALS — BP 109/76 | HR 87 | Temp 97.3°F | Ht 66.0 in | Wt 208.6 lb

## 2022-11-30 DIAGNOSIS — M25511 Pain in right shoulder: Secondary | ICD-10-CM

## 2022-11-30 DIAGNOSIS — G8929 Other chronic pain: Secondary | ICD-10-CM

## 2022-11-30 DIAGNOSIS — E1165 Type 2 diabetes mellitus with hyperglycemia: Secondary | ICD-10-CM

## 2022-11-30 LAB — POCT GLYCOSYLATED HEMOGLOBIN (HGB A1C): Hemoglobin A1C: 5.3 % (ref 4.0–5.6)

## 2022-11-30 MED ORDER — LIDOCAINE 5 % EX PTCH: 1.0000 | MEDICATED_PATCH | CUTANEOUS | 0 refills | Status: DC

## 2022-11-30 MED ORDER — KETOROLAC TROMETHAMINE 30 MG/ML IJ SOLN
30.0000 mg | Freq: Once | INTRAMUSCULAR | Status: AC
  Administered 2022-11-30: 30 mg via INTRAMUSCULAR

## 2022-11-30 NOTE — Patient Instructions (Signed)
1. Type 2 diabetes mellitus with hyperglycemia, without long-term current use of insulin ([facility])  - POCT glycosylated hemoglobin (Hb A1C) - CBC - Comprehensive metabolic panel  2. Chronic right shoulder pain  - ketorolac (TORADOL) 30 MG/ML injection 30 mg - lidocaine (LIDODERM) 5 %; Place 1 patch onto the skin daily. Remove & Discard patch within 12 hours or as directed by MD  Dispense: 30 patch; Refill: 0   Follow up:  Follow up in 3 months

## 2022-11-30 NOTE — Progress Notes (Signed)
@Patient  ID: Shannon Oneal, female    DOB: 06-25-1981, 42 y.o.   MRN: [id number]  Chief Complaint  Patient presents with   Follow-up    Referring provider: Ivonne Andrew, NP   HPI  42 year old female with history of DVT, PE, diabetes, asthma.    Patient presents today for diabetic follow-up.  Patient is compliant with meds. Her A1c is 5.3 today which is down from last visit.   Denies f/c/s, n/v/d, hemoptysis, PND, leg swelling. Denies chest pain or edema.  Patient does complain of pain to her right shoulder and arm and pain to her right leg which is chronic.  She she is currently following with neurology for migraines and polyneuropathy.  She has tried multiple medications with no relief.  We discussed that she does need to call the neurologist to let him know that the latest medication they tried did not help.  We will give Toradol injection in the office today.  We will order Lidoderm patches for her shoulder. Denies f/c/s, n/v/d, hemoptysis, PND, leg swelling Denies chest pain or edema       Allergies  Allergen Reactions   Amoxapine And Related Other (See Comments)    Burns skin, hair falls out   Amoxicillin Other (See Comments)    Burns skin and hair falls out   Oxycodone Itching   Percocet [Oxycodone-Acetaminophen] Itching   Morphine Itching and Other (See Comments)    Must receive Benadryl to tolerate   Rocephin [Ceftriaxone] Swelling, Rash and Other (See Comments)    Swelling at site where received   Tramadol Itching and Other (See Comments)    Must receive Benadryl to tolerate    Immunization History  Administered Date(s) Administered   Influenza,inj,Quad PF,6+ Mos 05/12/2021   PFIZER Comirnaty(Gray Top)Covid-19 Tri-Sucrose Vaccine 11/05/2019, 11/26/2019, 11/02/2020   PNEUMOCOCCAL CONJUGATE-20 05/24/2021   Tdap 11/18/2012    Past Medical History:  Diagnosis Date   Acute deep vein thrombosis (DVT) of right lower extremity (HCC) 01/18/2021   Acute pulmonary  embolism (HCC) 01/19/2021   Asthma    Cellulitis in diabetic foot (HCC)    Diabetes mellitus without complication (HCC)    DVT (deep venous thrombosis) (HCC) 02/05/2021   DVT (deep venous thrombosis) (HCC)    PE (pulmonary thromboembolism) (HCC) 2016    Tobacco History: Social History   Tobacco Use  Smoking Status Never  Smokeless Tobacco Never   Counseling given: Not Answered   Outpatient Encounter Medications as of 11/30/2022  Medication Sig   Accu-Chek Softclix Lancets lancets Use as instructed   albuterol (VENTOLIN HFA) 108 (90 Base) MCG/ACT inhaler Inhale 2 puffs into the lungs every 6 (six) hours as needed for wheezing or shortness of breath.   apixaban (ELIQUIS) 5 MG TABS tablet Take 1 tablet (5 mg total) by mouth 2 (two) times daily.   blood glucose meter kit and supplies KIT Dispense based on patient and insurance preference. Use up to four times daily as directed.   glucose blood (ACCU-CHEK GUIDE) test strip Use as instructed   lidocaine (LIDODERM) 5 % Place 1 patch onto the skin daily. Remove & Discard patch within 12 hours or as directed by MD   metFORMIN (GLUCOPHAGE-XR) 500 MG 24 hr tablet Take 2 tablets (1,000 mg total) by mouth 2 (two) times daily.   Semaglutide, 1 MG/DOSE, (OZEMPIC, 1 MG/DOSE,) 4 MG/3ML SOPN Inject 1 mg as directed once a week.   clotrimazole (LOTRIMIN) 1 % cream Apply 1 Application topically 2 (two) times  daily. (Patient not taking: Reported on 11/30/2022)   Erenumab-aooe (AIMOVIG) 140 MG/ML SOAJ Inject 140 mg into the skin every 28 (twenty-eight) days. (Patient not taking: Reported on 09/28/2022)   [DISCONTINUED] diphenhydrAMINE (BENADRYL) 25 mg capsule Take 1 capsule (25 mg total) by mouth every 6 (six) hours as needed for itching. (Patient not taking: No sig reported)   [DISCONTINUED] metoCLOPramide (REGLAN) 10 MG tablet Take 1 tablet (10 mg total) by mouth every 6 (six) hours as needed (Nausea or headache). (Patient not taking: Reported on 10/12/2017)    [DISCONTINUED] promethazine (PHENERGAN) 25 MG tablet Take 1 tablet (25 mg total) by mouth every 6 (six) hours as needed for nausea or vomiting (headache). (Patient not taking: No sig reported)   [EXPIRED] ketorolac (TORADOL) 30 MG/ML injection 30 mg    No facility-administered encounter medications on file as of 11/30/2022.     Review of Systems  Review of Systems  Constitutional: Negative.   HENT: Negative.    Cardiovascular: Negative.   Gastrointestinal: Negative.   Allergic/Immunologic: Negative.   Neurological: Negative.   Psychiatric/Behavioral: Negative.         Physical Exam  BP 109/76   Pulse 87   Temp (!) 97.3 F (36.3 C)   Ht 5\' 6"  (1.676 m)   Wt 208 lb 9.6 oz (94.6 kg)   LMP 11/12/2022 (Approximate)   SpO2 100%   BMI 33.67 kg/m   Wt Readings from Last 5 Encounters:  11/30/22 208 lb 9.6 oz (94.6 kg)  09/01/22 228 lb (103.4 kg)  09/01/22 229 lb (103.9 kg)  08/09/22 235 lb (106.6 kg)  06/01/22 232 lb (105.2 kg)     Physical Exam Vitals and nursing note reviewed.  Constitutional:      General: She is not in acute distress.    Appearance: She is well-developed.  Cardiovascular:     Rate and Rhythm: Normal rate and regular rhythm.  Pulmonary:     Effort: Pulmonary effort is normal.     Breath sounds: Normal breath sounds.  Neurological:     Mental Status: She is alert and oriented to person, place, and time.      Lab Results:  CBC    Component Value Date/Time   WBC 6.0 08/09/2022 1230   RBC 5.88 (H) 08/09/2022 1230   HGB 15.3 (H) 08/09/2022 1242   HGB 14.4 03/01/2022 1009   HCT 45.0 08/09/2022 1242   HCT 44.8 03/01/2022 1009   PLT 371 08/09/2022 1230   PLT 347 03/01/2022 1009   MCV 74.5 (L) 08/09/2022 1230   MCV 72 (L) 03/01/2022 1009   MCH 23.0 (L) 08/09/2022 1230   MCHC 30.8 08/09/2022 1230   RDW 13.7 08/09/2022 1230   RDW 14.1 03/01/2022 1009   LYMPHSABS 2.9 08/09/2022 1230   MONOABS 0.4 08/09/2022 1230   EOSABS 0.1 08/09/2022 1230    BASOSABS 0.0 08/09/2022 1230    BMET    Component Value Date/Time   NA 141 08/09/2022 1242   NA 138 03/01/2022 1009   K 4.3 08/09/2022 1242   CL 104 08/09/2022 1242   CO2 26 08/09/2022 1230   GLUCOSE 108 (H) 08/09/2022 1242   BUN 12 08/09/2022 1242   BUN 10 03/01/2022 1009   CREATININE 0.90 08/09/2022 1242   CREATININE 0.90 05/20/2021 1017   CALCIUM 8.9 08/09/2022 1230   GFRNONAA >60 08/09/2022 1230   GFRNONAA >60 05/20/2021 1017   GFRAA >60 10/31/2019 1713    BNP    Component Value Date/Time  BNP 12.7 01/12/2022 1527     Assessment & Plan:   Type 2 diabetes mellitus with hyperglycemia (HCC) - POCT glycosylated hemoglobin (Hb A1C) - CBC - Comprehensive metabolic panel  2. Chronic right shoulder pain  - ketorolac (TORADOL) 30 MG/ML injection 30 mg - lidocaine (LIDODERM) 5 %; Place 1 patch onto the skin daily. Remove & Discard patch within 12 hours or as directed by MD  Dispense: 30 patch; Refill: 0   Follow up:  Follow up in 3 months     Ivonne Andrew, NP 11/30/2022

## 2022-11-30 NOTE — Assessment & Plan Note (Signed)
-   POCT glycosylated hemoglobin (Hb A1C) - CBC - Comprehensive metabolic panel  2. Chronic right shoulder pain  - ketorolac (TORADOL) 30 MG/ML injection 30 mg - lidocaine (LIDODERM) 5 %; Place 1 patch onto the skin daily. Remove & Discard patch within 12 hours or as directed by MD  Dispense: 30 patch; Refill: 0   Follow up:  Follow up in 3 months

## 2022-12-01 LAB — CBC
Hematocrit: 40.4 % (ref 34.0–46.6)
Hemoglobin: 12.7 g/dL (ref 11.1–15.9)
MCH: 23.3 pg — ABNORMAL LOW (ref 26.6–33.0)
MCHC: 31.4 g/dL — ABNORMAL LOW (ref 31.5–35.7)
MCV: 74 fL — ABNORMAL LOW (ref 79–97)
Platelets: 337 10*3/uL (ref 150–450)
RBC: 5.44 x10E6/uL — ABNORMAL HIGH (ref 3.77–5.28)
RDW: 14.5 % (ref 11.7–15.4)
WBC: 6.4 10*3/uL (ref 3.4–10.8)

## 2022-12-01 LAB — COMPREHENSIVE METABOLIC PANEL
ALT: 14 IU/L (ref 0–32)
AST: 20 IU/L (ref 0–40)
Albumin/Globulin Ratio: 1.3 (ref 1.2–2.2)
Albumin: 3.8 g/dL — ABNORMAL LOW (ref 3.9–4.9)
Alkaline Phosphatase: 51 IU/L (ref 44–121)
BUN/Creatinine Ratio: 11 (ref 9–23)
BUN: 9 mg/dL (ref 6–24)
Bilirubin Total: 0.3 mg/dL (ref 0.0–1.2)
CO2: 22 mmol/L (ref 20–29)
Calcium: 9.2 mg/dL (ref 8.7–10.2)
Chloride: 105 mmol/L (ref 96–106)
Creatinine, Ser: 0.82 mg/dL (ref 0.57–1.00)
Globulin, Total: 3 g/dL (ref 1.5–4.5)
Glucose: 102 mg/dL — ABNORMAL HIGH (ref 70–99)
Potassium: 4.2 mmol/L (ref 3.5–5.2)
Sodium: 141 mmol/L (ref 134–144)
Total Protein: 6.8 g/dL (ref 6.0–8.5)
eGFR: 92 mL/min/{1.73_m2} (ref >59)

## 2022-12-05 ENCOUNTER — Other Ambulatory Visit: Payer: Self-pay

## 2022-12-19 ENCOUNTER — Other Ambulatory Visit: Payer: Self-pay

## 2022-12-19 ENCOUNTER — Telehealth: Payer: Medicaid Other | Admitting: Nurse Practitioner

## 2022-12-19 DIAGNOSIS — M545 Low back pain, unspecified: Secondary | ICD-10-CM

## 2022-12-19 NOTE — Progress Notes (Signed)
Because we cannot prescribe stronger medications than you have had, I feel your condition warrants further evaluation and I recommend that you be seen in a face to face visit.   NOTE: There will be NO CHARGE for this eVisit   If you are having a true medical emergency please call 911.      For an urgent face to face visit, [facility] has eight urgent care centers for your convenience:   NEW!! [facility] Health Urgent Care Center at [facility] Get Driving Directions [phone] [address], [address] [place], [address]    [facility] Health Urgent Care Center at [facility] Get Driving Directions [phone] [address] [place], [place] [address]   [facility] Urgent Care Center [facility]) Get Driving Directions [phone] [address] [place], [place] [address]  [facility] Health Urgent Care Center [facility]) Get Driving Directions [phone] [address] [place],  [place]  [address]  [facility] Health Urgent Care Center [facility] - at [employer]  [phone] [address] [place],  [place] [address]   [facility] Health Urgent Care at [facility] Get Driving Directions [phone] [address], [address] [place], [place] [address]   [facility] Health Urgent Care at [facility] Get Driving Directions  [phone] [address] [place], [place] [address]   [facility] Health Urgent Care at [facility] Directions [phone] [address], [address] [place], [place] [address]  Your MyChart E-visit questionnaire answers were reviewed by a board certified advanced clinical practitioner to complete your personal care plan based on your specific symptoms.  Thank you for using e-Visits.

## 2022-12-21 ENCOUNTER — Ambulatory Visit: Payer: Medicaid Other | Admitting: Neurology

## 2022-12-28 ENCOUNTER — Ambulatory Visit (INDEPENDENT_AMBULATORY_CARE_PROVIDER_SITE_OTHER): Payer: Medicaid Other | Admitting: Nurse Practitioner

## 2022-12-28 ENCOUNTER — Encounter: Payer: Self-pay | Admitting: Nurse Practitioner

## 2022-12-28 VITALS — BP 96/61 | HR 81 | Temp 97.2°F | Wt 210.2 lb

## 2022-12-28 DIAGNOSIS — M25512 Pain in left shoulder: Secondary | ICD-10-CM

## 2022-12-28 DIAGNOSIS — M25511 Pain in right shoulder: Secondary | ICD-10-CM | POA: Insufficient documentation

## 2022-12-28 MED ORDER — PREDNISONE 20 MG PO TABS: 20.0000 mg | ORAL_TABLET | Freq: Every day | ORAL | 0 refills | Status: DC

## 2022-12-28 NOTE — Patient Instructions (Addendum)
1. Acute pain of left shoulder  - DG Shoulder Left - Ambulatory referral to Orthopedics - appointment scheduled for tomorrow  2. Acute pain of right shoulder  - DG Shoulder Right  Follow up:  Follow up as needed

## 2022-12-28 NOTE — Assessment & Plan Note (Signed)
-   DG Shoulder Left - Ambulatory referral to Orthopedics - appointment scheduled for tomorrow  2. Acute pain of right shoulder  - DG Shoulder Right  Follow up:  Follow up as needed

## 2022-12-28 NOTE — Progress Notes (Signed)
@Patient  ID: Shannon Oneal, female    DOB: [birth year], 42 y.o.   MRN: [id number]  Chief Complaint  Patient presents with   Back Pain    With neck and arm started three weeks ago    Referring provider: Ivonne Andrew, NP   HPI  Patient presents today for acute visit.  She is having bilateral shoulder pain.  At her last visit here she was having pain more on her right shoulder.  She was given Toradol and lidocaine patch but states that this has not helped.  She is saying that she is having more pain on her left shoulder.  Her left shoulder does appear swollen and she states that she cannot raise her arm.  We will try to get patient in for stat referral with Ortho.  We have ordered x-rays for patient. Denies f/c/s, n/v/d, hemoptysis, PND, leg swelling Denies chest pain or edema      Allergies  Allergen Reactions   Amoxapine And Related Other (See Comments)    Burns skin, hair falls out   Amoxicillin Other (See Comments)    Burns skin and hair falls out   Oxycodone Itching   Percocet [Oxycodone-Acetaminophen] Itching   Morphine Itching and Other (See Comments)    Must receive Benadryl to tolerate   Rocephin [Ceftriaxone] Swelling, Rash and Other (See Comments)    Swelling at site where received   Tramadol Itching and Other (See Comments)    Must receive Benadryl to tolerate    Immunization History  Administered Date(s) Administered   Influenza,inj,Quad PF,6+ Mos 05/12/2021   PFIZER Comirnaty(Gray Top)Covid-19 Tri-Sucrose Vaccine 11/05/2019, 11/26/2019, 11/02/2020   PNEUMOCOCCAL CONJUGATE-20 05/24/2021   Tdap 11/18/2012    Past Medical History:  Diagnosis Date   Acute deep vein thrombosis (DVT) of right lower extremity (HCC) 01/18/2021   Acute pulmonary embolism (HCC) 01/19/2021   Asthma    Cellulitis in diabetic foot (HCC)    Diabetes mellitus without complication (HCC)    DVT (deep venous thrombosis) (HCC) 02/05/2021   DVT (deep venous thrombosis) (HCC)    PE  (pulmonary thromboembolism) (HCC) 2016    Tobacco History: Social History   Tobacco Use  Smoking Status Never  Smokeless Tobacco Never   Counseling given: Not Answered   Outpatient Encounter Medications as of 12/28/2022  Medication Sig   Accu-Chek Softclix Lancets lancets Use as instructed   albuterol (VENTOLIN HFA) 108 (90 Base) MCG/ACT inhaler Inhale 2 puffs into the lungs every 6 (six) hours as needed for wheezing or shortness of breath.   apixaban (ELIQUIS) 5 MG TABS tablet Take 1 tablet (5 mg total) by mouth 2 (two) times daily.   blood glucose meter kit and supplies KIT Dispense based on patient and insurance preference. Use up to four times daily as directed.   glucose blood (ACCU-CHEK GUIDE) test strip Use as instructed   lidocaine (LIDODERM) 5 % Place 1 patch onto the skin daily. Remove & Discard patch within 12 hours or as directed by MD   metFORMIN (GLUCOPHAGE-XR) 500 MG 24 hr tablet Take 2 tablets (1,000 mg total) by mouth 2 (two) times daily.   predniSONE (DELTASONE) 20 MG tablet Take 1 tablet (20 mg total) by mouth daily with breakfast.   Semaglutide, 1 MG/DOSE, (OZEMPIC, 1 MG/DOSE,) 4 MG/3ML SOPN Inject 1 mg as directed once a week.   clotrimazole (LOTRIMIN) 1 % cream Apply 1 Application topically 2 (two) times daily. (Patient not taking: Reported on 11/30/2022)   Erenumab-aooe (AIMOVIG) 140  MG/ML SOAJ Inject 140 mg into the skin every 28 (twenty-eight) days. (Patient not taking: Reported on 09/28/2022)   [DISCONTINUED] diphenhydrAMINE (BENADRYL) 25 mg capsule Take 1 capsule (25 mg total) by mouth every 6 (six) hours as needed for itching.   [DISCONTINUED] metoCLOPramide (REGLAN) 10 MG tablet Take 1 tablet (10 mg total) by mouth every 6 (six) hours as needed (Nausea or headache). (Patient not taking: Reported on 10/12/2017)   [DISCONTINUED] promethazine (PHENERGAN) 25 MG tablet Take 1 tablet (25 mg total) by mouth every 6 (six) hours as needed for nausea or vomiting (headache).  (Patient not taking: No sig reported)   No facility-administered encounter medications on file as of 12/28/2022.     Review of Systems  Review of Systems  Constitutional: Negative.   HENT: Negative.    Cardiovascular: Negative.   Gastrointestinal: Negative.   Musculoskeletal:  Positive for neck pain.       Bilateral shoulder pain  Allergic/Immunologic: Negative.   Neurological: Negative.   Psychiatric/Behavioral: Negative.         Physical Exam  BP 96/61   Pulse 81   Temp (!) 97.2 F (36.2 C)   Wt 210 lb 3.2 oz (95.3 kg)   LMP 11/12/2022 (Approximate)   SpO2 100%   BMI 33.93 kg/m   Wt Readings from Last 5 Encounters:  12/28/22 210 lb 3.2 oz (95.3 kg)  11/30/22 208 lb 9.6 oz (94.6 kg)  09/01/22 228 lb (103.4 kg)  09/01/22 229 lb (103.9 kg)  08/09/22 235 lb (106.6 kg)     Physical Exam Vitals and nursing note reviewed.  Constitutional:      General: She is not in acute distress.    Appearance: She is well-developed.  Cardiovascular:     Rate and Rhythm: Normal rate and regular rhythm.  Pulmonary:     Effort: Pulmonary effort is normal.     Breath sounds: Normal breath sounds.  Musculoskeletal:     Right shoulder: Tenderness present. Decreased range of motion.     Left shoulder: Swelling and tenderness present. Decreased range of motion.       Arms:  Neurological:     Mental Status: She is alert and oriented to person, place, and time.      Lab Results:  CBC    Component Value Date/Time   WBC 6.4 11/30/2022 1031   WBC 6.0 08/09/2022 1230   RBC 5.44 (H) 11/30/2022 1031   RBC 5.88 (H) 08/09/2022 1230   HGB 12.7 11/30/2022 1031   HCT 40.4 11/30/2022 1031   PLT 337 11/30/2022 1031   MCV 74 (L) 11/30/2022 1031   MCH 23.3 (L) 11/30/2022 1031   MCH 23.0 (L) 08/09/2022 1230   MCHC 31.4 (L) 11/30/2022 1031   MCHC 30.8 08/09/2022 1230   RDW 14.5 11/30/2022 1031   LYMPHSABS 2.9 08/09/2022 1230   MONOABS 0.4 08/09/2022 1230   EOSABS 0.1 08/09/2022  1230   BASOSABS 0.0 08/09/2022 1230    BMET    Component Value Date/Time   NA 141 11/30/2022 1031   K 4.2 11/30/2022 1031   CL 105 11/30/2022 1031   CO2 22 11/30/2022 1031   GLUCOSE 102 (H) 11/30/2022 1031   GLUCOSE 108 (H) 08/09/2022 1242   BUN 9 11/30/2022 1031   CREATININE 0.82 11/30/2022 1031   CREATININE 0.90 05/20/2021 1017   CALCIUM 9.2 11/30/2022 1031   GFRNONAA >60 08/09/2022 1230   GFRNONAA >60 05/20/2021 1017   GFRAA >60 10/31/2019 1713    BNP  Component Value Date/Time   BNP 12.7 01/12/2022 1527    ProBNP No results Oneal for: "PROBNP"  Imaging: No results Oneal.   Assessment & Plan:   Acute pain of left shoulder - DG Shoulder Left - Ambulatory referral to Orthopedics - appointment scheduled for tomorrow  2. Acute pain of right shoulder  - DG Shoulder Right  Follow up:  Follow up as needed     Ivonne Andrew, NP 12/28/2022

## 2022-12-29 ENCOUNTER — Ambulatory Visit (INDEPENDENT_AMBULATORY_CARE_PROVIDER_SITE_OTHER): Payer: Medicaid Other | Admitting: Student

## 2022-12-29 ENCOUNTER — Ambulatory Visit (HOSPITAL_BASED_OUTPATIENT_CLINIC_OR_DEPARTMENT_OTHER): Payer: Medicaid Other | Admitting: Orthopaedic Surgery

## 2022-12-29 DIAGNOSIS — M25512 Pain in left shoulder: Secondary | ICD-10-CM

## 2022-12-29 DIAGNOSIS — M7502 Adhesive capsulitis of left shoulder: Secondary | ICD-10-CM

## 2022-12-29 MED ORDER — LIDOCAINE HCL 1 % IJ SOLN
4.0000 mL | INTRAMUSCULAR | Status: AC | PRN
Start: 2022-12-29 — End: 2022-12-29
  Administered 2022-12-29: 4 mL

## 2022-12-29 MED ORDER — TRIAMCINOLONE ACETONIDE 40 MG/ML IJ SUSP
2.0000 mL | INTRAMUSCULAR | Status: AC | PRN
Start: 2022-12-29 — End: 2022-12-29
  Administered 2022-12-29: 2 mL via INTRA_ARTICULAR

## 2022-12-29 NOTE — Progress Notes (Signed)
Chief Complaint: Left shoulder pain     History of Present Illness:    Shannon Oneal is a 42 y.o. female presents today for evaluation of left shoulder pain after she was referred by her PCP yesterday.  Her pain started a few weeks ago without any known injury or cause.  She has moderate amounts of pain located in the shoulder joint as well as the upper back between the neck and shoulder.  Her range of motion has been very limited and inhibits her from performing normal daily activities.  She has had trouble sleeping at night.  She has tried lidocaine patches and received a Toradol injection yesterday that she hasn't felt improvement from yet.  Denies and numbness or tingling in the shoulder or down the arm.  She is a type 2 diabetic and is taking Ozempic.  Last A1C one month ago was 5.6.  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   Surgical History:   None  PMH/PSH/Family History/Social History/Meds/Allergies:    Past Medical History:  Diagnosis Date   Acute deep vein thrombosis (DVT) of right lower extremity (HCC) 01/18/2021   Acute pulmonary embolism (HCC) 01/19/2021   Asthma    Cellulitis in diabetic foot (HCC)    Diabetes mellitus without complication (HCC)    DVT (deep venous thrombosis) (HCC) 02/05/2021   DVT (deep venous thrombosis) (HCC)    PE (pulmonary thromboembolism) (HCC) 2016   Past Surgical History:  Procedure Laterality Date   adenoids     CESAREAN SECTION     Social History   Socioeconomic History   Marital status: Married    Spouse name: Not on file   Number of children: Not on file   Years of education: Not on file   Highest education level: Some college, no degree  Occupational History   Not on file  Tobacco Use   Smoking status: Never   Smokeless tobacco: Never  Vaping Use   Vaping Use: Former   Substances: CBD  Substance and Sexual Activity   Alcohol use: Yes   Drug use: Never   Sexual activity: Not  on file  Other Topics Concern   Not on file  Social History Narrative   Are you right handed or left handed? Right    Are you currently employed ? No   What is your current occupation?   Do you live at home alone?   Who lives with you? Husband, child and granddaughter   What type of home do you live in: 1 story or 2 story? 1       Social Determinants of Health   Financial Resource Strain: Medium Risk (11/30/2022)   Overall Financial Resource Strain (CARDIA)    Difficulty of Paying Living Expenses: Somewhat hard  Food Insecurity: Patient Declined (11/30/2022)   Hunger Vital Sign    Worried About Running Out of Food in the Last Year: Patient declined    Ran Out of Food in the Last Year: Patient declined  Transportation Needs: Unmet Transportation Needs (11/30/2022)   PRAPARE - Administrator, Civil Service (Medical): Yes    Lack of Transportation (Non-Medical): Yes  Physical Activity: Insufficiently Active (11/30/2022)   Exercise Vital Sign    Days of Exercise per Week: 2 days    Minutes  of Exercise per Session: 30 min  Stress: Stress Concern Present (11/30/2022)   [employer] of Occupational Health - Occupational Stress Questionnaire    Feeling of Stress : To some extent  Social Connections: Socially Integrated (11/30/2022)   Social Connection and Isolation Panel [NHANES]    Frequency of Communication with Friends and Family: More than three times a week    Frequency of Social Gatherings with Friends and Family: Patient declined    Attends Religious Services: 1 to 4 times per year    Active Member of [employer] or Organizations: Yes    Attends Banker Meetings: 1 to 4 times per year    Marital Status: Married   Family History  Problem Relation Age of Onset   Deep vein thrombosis Father    Breast cancer Sister 12   Allergies  Allergen Reactions   Amoxapine And Related Other (See Comments)    Burns skin, hair falls out   Amoxicillin Other (See Comments)     Burns skin and hair falls out   Oxycodone Itching   Percocet [Oxycodone-Acetaminophen] Itching   Morphine Itching and Other (See Comments)    Must receive Benadryl to tolerate   Rocephin [Ceftriaxone] Swelling, Rash and Other (See Comments)    Swelling at site where received   Tramadol Itching and Other (See Comments)    Must receive Benadryl to tolerate   Current Outpatient Medications  Medication Sig Dispense Refill   Accu-Chek Softclix Lancets lancets Use as instructed 100 each 12   albuterol (VENTOLIN HFA) 108 (90 Base) MCG/ACT inhaler Inhale 2 puffs into the lungs every 6 (six) hours as needed for wheezing or shortness of breath. 8.5 g 0   apixaban (ELIQUIS) 5 MG TABS tablet Take 1 tablet (5 mg total) by mouth 2 (two) times daily. 60 tablet 5   blood glucose meter kit and supplies KIT Dispense based on patient and insurance preference. Use up to four times daily as directed. 1 each 0   clotrimazole (LOTRIMIN) 1 % cream Apply 1 Application topically 2 (two) times daily. (Patient not taking: Reported on 11/30/2022) 60 g 0   Erenumab-aooe (AIMOVIG) 140 MG/ML SOAJ Inject 140 mg into the skin every 28 (twenty-eight) days. (Patient not taking: Reported on 09/28/2022) 1.12 mL 5   glucose blood (ACCU-CHEK GUIDE) test strip Use as instructed 100 each 12   lidocaine (LIDODERM) 5 % Place 1 patch onto the skin daily. Remove & Discard patch within 12 hours or as directed by MD 30 patch 0   metFORMIN (GLUCOPHAGE-XR) 500 MG 24 hr tablet Take 2 tablets (1,000 mg total) by mouth 2 (two) times daily. 360 tablet 3   predniSONE (DELTASONE) 20 MG tablet Take 1 tablet (20 mg total) by mouth daily with breakfast. 5 tablet 0   Semaglutide, 1 MG/DOSE, (OZEMPIC, 1 MG/DOSE,) 4 MG/3ML SOPN Inject 1 mg as directed once a week. 3 mL 2   No current facility-administered medications for this visit.   No results found.  Review of Systems:   A ROS was performed including pertinent positives and negatives as documented  in the HPI.  Physical Exam :   Constitutional: NAD and appears stated age Neurological: Alert and oriented Psych: Appropriate affect and cooperative Last menstrual period 11/12/2022.   Comprehensive Musculoskeletal Exam:     Musculoskeletal Exam    Inspection Right Left  Skin No atrophy or winging No atrophy or winging  Palpation    Tenderness None Anterior and superior glenohumeral joint.  Also left upper trapezius with large, tender trigger point  Range of Motion    Flexion (passive) 160 70  Flexion (active) 160 30  ER at the side 50 20  Can reach behind back to L4 Unable to perform  Strength     5/5 Limited due to pain  Special Tests    Pseudoparalytic No No  Neurologic    Fires PIN, radial, median, ulnar, musculocutaneous, axillary, suprascapular, long thoracic, and spinal accessory innervated muscles. No abnormal sensibility  Vascular/Lymphatic    Radial Pulse 2+ 2+  Cervical Exam    Patient has symmetric cervical range of motion with negative Spurling's test.  Special Test: N/A     Imaging:      Assessment:   42 y.o. female with atraumatic shoulder pain and significant loss of active and passive range of motion consistent with adhesive capsulitis.  We did discuss that her history of type 2 diabetes is a known risk factor.  After discussion of typical pathology progression and treatment options, I did recommend cortisone injection of the glenohumeral joint which was performed today under ultrasound guidance without complication.  Patient does have tightness and trigger points in her upper trapezius, worse on the left side, so I would consider physical therapy in the future should this not improve with restored shoulder ROM.  I will plan on having her return in 2 weeks for reevaluation, possible repeat injection, and may obtain x-rays since those were not completed yesterday with primary care.  Plan :    - Return to clinic in 2 weeks for reevaluation     Procedure  Note  Patient: Shannon Oneal             Date of Birth: 08-Dec-1980           MRN: [id number]             Visit Date: 12/29/2022  Procedures: Visit Diagnoses:  1. Adhesive capsulitis of left shoulder     Large Joint Inj: L glenohumeral on 12/29/2022 11:40 AM Indications: pain Details: 22 G 1.5 in needle, ultrasound-guided anterior approach Medications: 4 mL lidocaine 1 %; 2 mL triamcinolone acetonide 40 MG/ML Outcome: tolerated well, no immediate complications Consent was given by the patient. Patient was prepped and draped in the usual sterile fashion.      I personally saw and evaluated the patient, and participated in the management and treatment plan.  Hazle Nordmann, PA-C Orthopedics  This document was dictated using Conservation officer, historic buildings. A reasonable attempt at proof reading has been made to minimize errors.

## 2023-01-03 NOTE — Progress Notes (Deleted)
NEUROLOGY FOLLOW UP OFFICE NOTE  Shannon Oneal [id number]  Assessment/Plan:   Migraine without aura, without status migrainosus, intractable Diabetic polyneuropathy   Migraine prevention:  Start Aimovig 140mg  Migraine rescue:  She will try samples of Nurtec Unfortunately, she has already tried and failed the medications I typically prescribe for neuralgia.  Recommend referral to pain management.  She declines at this time. Follow up 4-5 months.       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  UPDATE: Started Aimovig. Nurtec PRN  Intensity:  *** Duration:  *** Frequency:  ***  Current NSAIDS/analgesics:  Excedrin Migraine (ineffective) Current triptans:  none Current ergotamine:  none Current anti-emetic:  none Current muscle relaxants:  none Current Antihypertensive medications:  none Current Antidepressant medications:  none Current Anticonvulsant medications:  none Current anti-CGRP:  none Other therapy:  none Birth control:  none Other medications:  Eliquis, metformin  HISTORY:  Migraines: Onset:  42 years old.  Describes severe right occipital stabbing or pounding pain.  Associated with Nausea, photophobia, phonophobia, blurred vision.  Usually lasts 3 days.  They usually occur 2 times a month (total 6 headache days a month).  Triggers:  Unknown.  Relieving factors:  rest in dark room listening to relaxing white noise.   She was seen in the ED on 08/09/2022 where CT Head personally reviewed was negative.     Diabetic Polyneuropathy: Pain in legs and feet since 2022.  Initially with DVT in left leg but pain never improved.  Burning stabbing pain in the feet and legs.  Feels like "ants are crawling up and down my legs".  She has diabetes which has been uncontrolled for a while.  Last Hgb A1c  from November was 7.6.  Previously used pregablin, gabapentin, duloxetine, and amitriptyline.  As she has already tried and failed the medications I typically prescribe for neuralgia, recommended referral to pain management, which she declined.     Past NSAIDS/analgesics:  meloxicam, lidocaine patch, morphine (itching), oxycodone (itching), naproxen Past abortive triptans:  sumatriptan Past abortive ergotamine:  none Past muscle relaxants:  Flexeril Past anti-emetic:  Reglan, Phenergan Past antihypertensive medications:  furosemide Past antidepressant medications:  Amitriptyline (drowsiness), duloxetine Past anticonvulsant medications:  gabapentin Other past therapies:  none     PAST MEDICAL HISTORY: Past Medical History:  Diagnosis Date   Acute deep vein thrombosis (DVT) of right lower extremity (HCC) 01/18/2021   Acute pulmonary embolism (HCC) 01/19/2021   Asthma    Cellulitis in diabetic foot (HCC)    Diabetes mellitus without complication (HCC)    DVT (deep venous thrombosis) (HCC) 02/05/2021   DVT (deep venous thrombosis) (HCC)    PE (pulmonary thromboembolism) (HCC) 2016    MEDICATIONS: Current Outpatient Medications on File Prior to Visit  Medication Sig Dispense Refill   Accu-Chek Softclix Lancets lancets Use as instructed 100 each 12   albuterol (VENTOLIN HFA) 108 (90 Base) MCG/ACT inhaler Inhale 2 puffs into the lungs every 6 (six) hours as needed for wheezing or shortness of breath. 8.5 g 0   apixaban (ELIQUIS) 5 MG TABS tablet Take 1 tablet (5 mg total) by mouth 2 (two) times daily. 60 tablet 5   blood glucose meter kit and supplies KIT Dispense based on patient and insurance preference. Use up to four times daily as directed. 1 each 0  clotrimazole (LOTRIMIN) 1 % cream Apply 1 Application topically 2 (two) times daily. (Patient not taking: Reported on 11/30/2022) 60 g 0   Erenumab-aooe (AIMOVIG) 140 MG/ML SOAJ Inject 140 mg into the skin every 28 (twenty-eight) days.  (Patient not taking: Reported on 09/28/2022) 1.12 mL 5   glucose blood (ACCU-CHEK GUIDE) test strip Use as instructed 100 each 12   lidocaine (LIDODERM) 5 % Place 1 patch onto the skin daily. Remove & Discard patch within 12 hours or as directed by MD 30 patch 0   metFORMIN (GLUCOPHAGE-XR) 500 MG 24 hr tablet Take 2 tablets (1,000 mg total) by mouth 2 (two) times daily. 360 tablet 3   predniSONE (DELTASONE) 20 MG tablet Take 1 tablet (20 mg total) by mouth daily with breakfast. 5 tablet 0   Semaglutide, 1 MG/DOSE, (OZEMPIC, 1 MG/DOSE,) 4 MG/3ML SOPN Inject 1 mg as directed once a week. 3 mL 2   [DISCONTINUED] diphenhydrAMINE (BENADRYL) 25 mg capsule Take 1 capsule (25 mg total) by mouth every 6 (six) hours as needed for itching. 30 capsule 0   [DISCONTINUED] metoCLOPramide (REGLAN) 10 MG tablet Take 1 tablet (10 mg total) by mouth every 6 (six) hours as needed (Nausea or headache). (Patient not taking: Reported on 10/12/2017) 30 tablet 0   [DISCONTINUED] promethazine (PHENERGAN) 25 MG tablet Take 1 tablet (25 mg total) by mouth every 6 (six) hours as needed for nausea or vomiting (headache). (Patient not taking: No sig reported) 12 tablet 0   No current facility-administered medications on file prior to visit.    ALLERGIES: Allergies  Allergen Reactions   Amoxapine And Related Other (See Comments)    Burns skin, hair falls out   Amoxicillin Other (See Comments)    Burns skin and hair falls out   Oxycodone Itching   Percocet [Oxycodone-Acetaminophen] Itching   Morphine Itching and Other (See Comments)    Must receive Benadryl to tolerate   Rocephin [Ceftriaxone] Swelling, Rash and Other (See Comments)    Swelling at site where received   Tramadol Itching and Other (See Comments)    Must receive Benadryl to tolerate    FAMILY HISTORY: Family History  Problem Relation Age of Onset   Deep vein thrombosis Father    Breast cancer Sister 19      Objective:  *** General: No acute  distress.  Patient appears ***-groomed.   Head:  Normocephalic/atraumatic Eyes:  Fundi examined but not visualized Neck: supple, no paraspinal tenderness, full range of motion Heart:  Regular rate and rhythm Lungs:  Clear to auscultation bilaterally Back: No paraspinal tenderness Neurological Exam: alert and oriented.  Speech fluent and not dysarthric, language intact.  CN II-XII intact. Bulk and tone normal, muscle strength 5/5 throughout.  Sensation to light touch intact.  Deep tendon reflexes 2+ throughout, toes downgoing.  Finger to nose testing intact.  Gait normal, Romberg negative.   Shon Millet, DO  CC: ***

## 2023-01-05 ENCOUNTER — Other Ambulatory Visit: Payer: Self-pay

## 2023-01-08 ENCOUNTER — Ambulatory Visit: Payer: Medicaid Other | Admitting: Neurology

## 2023-01-09 NOTE — Progress Notes (Unsigned)
NEUROLOGY FOLLOW UP OFFICE NOTE  Jodee Bishara [id number]  Assessment/Plan:   Migraine without aura, without status migrainosus, intractable Diabetic polyneuropathy   Migraine prevention:  Start Aimovig 140mg  Migraine rescue:  She will try samples of Nurtec Unfortunately, she has already tried and failed the medications I typically prescribe for neuralgia.  Recommend referral to pain management.  She declines at this time. Follow up 4-5 months.       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  UPDATE: Started Aimovig. Nurtec PRN  Intensity:  *** Duration:  *** Frequency:  ***  Current NSAIDS/analgesics:  Excedrin Migraine (ineffective) Current triptans:  none Current ergotamine:  none Current anti-emetic:  none Current muscle relaxants:  none Current Antihypertensive medications:  none Current Antidepressant medications:  none Current Anticonvulsant medications:  none Current anti-CGRP:  none Other therapy:  none Birth control:  none Other medications:  Eliquis, metformin  HISTORY:  Migraines: Onset:  42 years old.  Describes severe right occipital stabbing or pounding pain.  Associated with Nausea, photophobia, phonophobia, blurred vision.  Usually lasts 3 days.  They usually occur 2 times a month (total 6 headache days a month).  Triggers:  Unknown.  Relieving factors:  rest in dark room listening to relaxing white noise.   She was seen in the ED on 08/09/2022 where CT Head personally reviewed was negative.     Diabetic Polyneuropathy: Pain in legs and feet since 2022.  Initially with DVT in left leg but pain never improved.  Burning stabbing pain in the feet and legs.  Feels like "ants are crawling up and down my legs".  She has diabetes which has been uncontrolled for a while.  Last Hgb A1c  from November was 7.6.  Previously used pregablin, gabapentin, duloxetine, and amitriptyline.  As she has already tried and failed the medications I typically prescribe for neuralgia, recommended referral to pain management, which she declined.     Past NSAIDS/analgesics:  meloxicam, lidocaine patch, morphine (itching), oxycodone (itching), naproxen Past abortive triptans:  sumatriptan Past abortive ergotamine:  none Past muscle relaxants:  Flexeril Past anti-emetic:  Reglan, Phenergan Past antihypertensive medications:  furosemide Past antidepressant medications:  Amitriptyline (drowsiness), duloxetine Past anticonvulsant medications:  gabapentin Other past therapies:  none     PAST MEDICAL HISTORY: Past Medical History:  Diagnosis Date   Acute deep vein thrombosis (DVT) of right lower extremity (HCC) 01/18/2021   Acute pulmonary embolism (HCC) 01/19/2021   Asthma    Cellulitis in diabetic foot (HCC)    Diabetes mellitus without complication (HCC)    DVT (deep venous thrombosis) (HCC) 02/05/2021   DVT (deep venous thrombosis) (HCC)    PE (pulmonary thromboembolism) (HCC) 2016    MEDICATIONS: Current Outpatient Medications on File Prior to Visit  Medication Sig Dispense Refill   Accu-Chek Softclix Lancets lancets Use as instructed 100 each 12   albuterol (VENTOLIN HFA) 108 (90 Base) MCG/ACT inhaler Inhale 2 puffs into the lungs every 6 (six) hours as needed for wheezing or shortness of breath. 8.5 g 0   apixaban (ELIQUIS) 5 MG TABS tablet Take 1 tablet (5 mg total) by mouth 2 (two) times daily. 60 tablet 5   blood glucose meter kit and supplies KIT Dispense based on patient and insurance preference. Use up to four times daily as directed. 1 each 0  clotrimazole (LOTRIMIN) 1 % cream Apply 1 Application topically 2 (two) times daily. (Patient not taking: Reported on 11/30/2022) 60 g 0   Erenumab-aooe (AIMOVIG) 140 MG/ML SOAJ Inject 140 mg into the skin every 28 (twenty-eight) days.  (Patient not taking: Reported on 09/28/2022) 1.12 mL 5   glucose blood (ACCU-CHEK GUIDE) test strip Use as instructed 100 each 12   lidocaine (LIDODERM) 5 % Place 1 patch onto the skin daily. Remove & Discard patch within 12 hours or as directed by MD 30 patch 0   metFORMIN (GLUCOPHAGE-XR) 500 MG 24 hr tablet Take 2 tablets (1,000 mg total) by mouth 2 (two) times daily. 360 tablet 3   predniSONE (DELTASONE) 20 MG tablet Take 1 tablet (20 mg total) by mouth daily with breakfast. 5 tablet 0   Semaglutide, 1 MG/DOSE, (OZEMPIC, 1 MG/DOSE,) 4 MG/3ML SOPN Inject 1 mg as directed once a week. 3 mL 2   [DISCONTINUED] diphenhydrAMINE (BENADRYL) 25 mg capsule Take 1 capsule (25 mg total) by mouth every 6 (six) hours as needed for itching. 30 capsule 0   [DISCONTINUED] metoCLOPramide (REGLAN) 10 MG tablet Take 1 tablet (10 mg total) by mouth every 6 (six) hours as needed (Nausea or headache). (Patient not taking: Reported on 10/12/2017) 30 tablet 0   [DISCONTINUED] promethazine (PHENERGAN) 25 MG tablet Take 1 tablet (25 mg total) by mouth every 6 (six) hours as needed for nausea or vomiting (headache). (Patient not taking: No sig reported) 12 tablet 0   No current facility-administered medications on file prior to visit.    ALLERGIES: Allergies  Allergen Reactions   Amoxapine And Related Other (See Comments)    Burns skin, hair falls out   Amoxicillin Other (See Comments)    Burns skin and hair falls out   Oxycodone Itching   Percocet [Oxycodone-Acetaminophen] Itching   Morphine Itching and Other (See Comments)    Must receive Benadryl to tolerate   Rocephin [Ceftriaxone] Swelling, Rash and Other (See Comments)    Swelling at site where received   Tramadol Itching and Other (See Comments)    Must receive Benadryl to tolerate    FAMILY HISTORY: Family History  Problem Relation Age of Onset   Deep vein thrombosis Father    Breast cancer Sister 75      Objective:  *** General: No acute  distress.  Patient appears ***-groomed.   Head:  Normocephalic/atraumatic Eyes:  Fundi examined but not visualized Neck: supple, no paraspinal tenderness, full range of motion Heart:  Regular rate and rhythm Lungs:  Clear to auscultation bilaterally Back: No paraspinal tenderness Neurological Exam: alert and oriented.  Speech fluent and not dysarthric, language intact.  CN II-XII intact. Bulk and tone normal, muscle strength 5/5 throughout.  Sensation to light touch intact.  Deep tendon reflexes 2+ throughout, toes downgoing.  Finger to nose testing intact.  Gait normal, Romberg negative.   Shon Millet, DO  CC: ***

## 2023-01-10 ENCOUNTER — Encounter: Payer: Self-pay | Admitting: Neurology

## 2023-01-10 ENCOUNTER — Ambulatory Visit (INDEPENDENT_AMBULATORY_CARE_PROVIDER_SITE_OTHER): Payer: Medicaid Other | Admitting: Neurology

## 2023-01-10 ENCOUNTER — Other Ambulatory Visit: Payer: Self-pay

## 2023-01-10 VITALS — BP 105/80 | HR 83 | Ht 66.0 in | Wt 206.0 lb

## 2023-01-10 DIAGNOSIS — G43119 Migraine with aura, intractable, without status migrainosus: Secondary | ICD-10-CM

## 2023-01-10 MED ORDER — ONDANSETRON 4 MG PO TBDP
4.0000 mg | ORAL_TABLET | Freq: Three times a day (TID) | ORAL | 5 refills | Status: DC | PRN
  Filled 2023-01-10: qty 20, 7d supply, fill #0

## 2023-01-10 NOTE — Patient Instructions (Signed)
Continue Aimovig 140mg  every 28 days At earliest onset of migraine, take Ubrelvy.  May repeat after 2 hours (maximum 2 tablets in 24 hours).  Let me know if it works and I can send in a prescription.  Otherwise, we can try something different.   Take the Zofran (ondansetron) at earliest onset of migraine as well Keep headache diary

## 2023-01-12 ENCOUNTER — Encounter (HOSPITAL_BASED_OUTPATIENT_CLINIC_OR_DEPARTMENT_OTHER): Payer: Self-pay | Admitting: Student

## 2023-01-12 ENCOUNTER — Ambulatory Visit (HOSPITAL_BASED_OUTPATIENT_CLINIC_OR_DEPARTMENT_OTHER): Payer: Medicaid Other | Admitting: Student

## 2023-01-12 ENCOUNTER — Ambulatory Visit (INDEPENDENT_AMBULATORY_CARE_PROVIDER_SITE_OTHER): Payer: Medicaid Other | Admitting: Student

## 2023-01-12 DIAGNOSIS — M25512 Pain in left shoulder: Secondary | ICD-10-CM | POA: Diagnosis not present

## 2023-01-12 DIAGNOSIS — M7502 Adhesive capsulitis of left shoulder: Secondary | ICD-10-CM | POA: Diagnosis not present

## 2023-01-12 MED ORDER — TRIAMCINOLONE ACETONIDE 40 MG/ML IJ SUSP
2.0000 mL | INTRAMUSCULAR | Status: AC | PRN
Start: 2023-01-12 — End: 2023-01-12
  Administered 2023-01-12: 2 mL via INTRA_ARTICULAR

## 2023-01-12 MED ORDER — LIDOCAINE HCL 1 % IJ SOLN
4.0000 mL | INTRAMUSCULAR | Status: AC | PRN
Start: 2023-01-12 — End: 2023-01-12
  Administered 2023-01-12: 4 mL

## 2023-01-12 NOTE — Progress Notes (Signed)
Chief Complaint: Left shoulder pain     History of Present Illness:   01/12/23: Patient presents today for follow-up evaluation of left adhesive capsulitis.  We did perform a cortisone injection of the left glenohumeral joint 2 weeks ago at the last visit.  She does note significant improvement with overall less pain and improved range of motion.  States that she feels 60% better at this point.  Does note continued soreness of the shoulder in the morning however this does improve later in the day.  Not currently taking any pain medications.    Shannon Oneal is a 42 y.o. female presents today for evaluation of left shoulder pain after she was referred by her PCP yesterday.  Her pain started a few weeks ago without any known injury or cause.  She has moderate amounts of pain located in the shoulder joint as well as the upper back between the neck and shoulder.  Her range of motion has been very limited and inhibits her from performing normal daily activities.  She has had trouble sleeping at night.  She has tried lidocaine patches and received a Toradol injection yesterday that she hasn't felt improvement from yet.  Denies and numbness or tingling in the shoulder or down the arm.  She is a type 2 diabetic and is taking Ozempic.  Last A1C one month ago was 5.6.  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   Surgical History:   None  PMH/PSH/Family History/Social History/Meds/Allergies:    Past Medical History:  Diagnosis Date   Acute deep vein thrombosis (DVT) of right lower extremity (HCC) 01/18/2021   Acute pulmonary embolism (HCC) 01/19/2021   Asthma    Cellulitis in diabetic foot (HCC)    Diabetes mellitus without complication (HCC)    DVT (deep venous thrombosis) (HCC) 02/05/2021   DVT (deep venous thrombosis) (HCC)    PE (pulmonary thromboembolism) (HCC) 2016   Past Surgical History:  Procedure Laterality Date   adenoids     CESAREAN  SECTION     Social History   Socioeconomic History   Marital status: Married    Spouse name: Not on file   Number of children: Not on file   Years of education: Not on file   Highest education level: Some college, no degree  Occupational History   Not on file  Tobacco Use   Smoking status: Never   Smokeless tobacco: Never  Vaping Use   Vaping Use: Former   Substances: CBD  Substance and Sexual Activity   Alcohol use: Yes   Drug use: Never   Sexual activity: Not on file  Other Topics Concern   Not on file  Social History Narrative   Are you right handed or left handed? Right    Are you currently employed ? No   What is your current occupation?   Do you live at home alone?   Who lives with you? Husband, child and granddaughter   What type of home do you live in: 1 story or 2 story? 1       Social Determinants of Health   Financial Resource Strain: Medium Risk (11/30/2022)   Overall Financial Resource Strain (CARDIA)    Difficulty of Paying Living Expenses: Somewhat hard  Food Insecurity: Patient Declined (11/30/2022)   Hunger  Vital Sign    Worried About Programme researcher, broadcasting/film/video in the Last Year: Patient declined    Ran Out of Food in the Last Year: Patient declined  Transportation Needs: Unmet Transportation Needs (11/30/2022)   PRAPARE - Administrator, Civil Service (Medical): Yes    Lack of Transportation (Non-Medical): Yes  Physical Activity: Insufficiently Active (11/30/2022)   Exercise Vital Sign    Days of Exercise per Week: 2 days    Minutes of Exercise per Session: 30 min  Stress: Stress Concern Present (11/30/2022)   [employer] of Occupational Health - Occupational Stress Questionnaire    Feeling of Stress : To some extent  Social Connections: Socially Integrated (11/30/2022)   Social Connection and Isolation Panel [NHANES]    Frequency of Communication with Friends and Family: More than three times a week    Frequency of Social Gatherings with Friends  and Family: Patient declined    Attends Religious Services: 1 to 4 times per year    Active Member of [employer] or Organizations: Yes    Attends Banker Meetings: 1 to 4 times per year    Marital Status: Married   Family History  Problem Relation Age of Onset   Deep vein thrombosis Father    Breast cancer Sister 58   Allergies  Allergen Reactions   Amoxapine And Related Other (See Comments)    Burns skin, hair falls out   Amoxicillin Other (See Comments)    Burns skin and hair falls out   Oxycodone Itching   Percocet [Oxycodone-Acetaminophen] Itching   Morphine Itching and Other (See Comments)    Must receive Benadryl to tolerate   Rocephin [Ceftriaxone] Swelling, Rash and Other (See Comments)    Swelling at site where received   Tramadol Itching and Other (See Comments)    Must receive Benadryl to tolerate   Current Outpatient Medications  Medication Sig Dispense Refill   Accu-Chek Softclix Lancets lancets Use as instructed 100 each 12   albuterol (VENTOLIN HFA) 108 (90 Base) MCG/ACT inhaler Inhale 2 puffs into the lungs every 6 (six) hours as needed for wheezing or shortness of breath. 8.5 g 0   apixaban (ELIQUIS) 5 MG TABS tablet Take 1 tablet (5 mg total) by mouth 2 (two) times daily. 60 tablet 5   blood glucose meter kit and supplies KIT Dispense based on patient and insurance preference. Use up to four times daily as directed. 1 each 0   Erenumab-aooe (AIMOVIG) 140 MG/ML SOAJ Inject 140 mg into the skin every 28 (twenty-eight) days. 1.12 mL 5   glucose blood (ACCU-CHEK GUIDE) test strip Use as instructed 100 each 12   lidocaine (LIDODERM) 5 % Place 1 patch onto the skin daily. Remove & Discard patch within 12 hours or as directed by MD 30 patch 0   metFORMIN (GLUCOPHAGE-XR) 500 MG 24 hr tablet Take 2 tablets (1,000 mg total) by mouth 2 (two) times daily. 360 tablet 3   ondansetron (ZOFRAN-ODT) 4 MG disintegrating tablet Take 1 tablet (4 mg total) by mouth every 8  (eight) hours as needed for nausea or vomiting. 20 tablet 5   Semaglutide, 1 MG/DOSE, (OZEMPIC, 1 MG/DOSE,) 4 MG/3ML SOPN Inject 1 mg as directed once a week. 3 mL 2   No current facility-administered medications for this visit.   No results found.  Review of Systems:   A ROS was performed including pertinent positives and negatives as documented in the HPI.  Physical Exam :  Constitutional: NAD and appears stated age Neurological: Alert and oriented Psych: Appropriate affect and cooperative There were no vitals taken for this visit.   Comprehensive Musculoskeletal Exam:     Musculoskeletal Exam    Inspection Right Left  Skin No atrophy or winging No atrophy or winging  Palpation    Tenderness None Anterior glenohumeral joint  Range of Motion    Flexion (passive) 160 130  Flexion (active) 160 120  ER at the side 50 30  Can reach behind back to L4 Back pocket  Strength     5/5 Limited due to pain  Special Tests    Pseudoparalytic No No  Neurologic    Fires PIN, radial, median, ulnar, musculocutaneous, axillary, suprascapular, long thoracic, and spinal accessory innervated muscles. No abnormal sensibility  Vascular/Lymphatic    Radial Pulse 2+ 2+  Cervical Exam    Patient has symmetric cervical range of motion with negative Spurling's test.  Special Test: N/A     Imaging:      Assessment:   42 y.o. female with adhesive capsulitis of the left shoulder.  She did get significant relief with cortisone injection at last visit so I would recommend repeating injection today for added relief in hopes to get her close to 100%.  Patient would like to proceed with this and left glenohumeral injection was performed under ultrasound guidance without complication.  Demonstrated wall climb exercises that she can be performing at home.  At this point I recommend she can return as needed should she get good relief post-injection.  If she continues to have deficits in range of motion or  pain we could have her return for reevaluation and consider adding physical therapy at that time.  All other questions welcomed and addressed.  Plan :    - Return to clinic as needed     Procedure Note  Patient: Shannon Oneal             Date of Birth: 06/22/1981           MRN: [id number]             Visit Date: 01/12/2023  Procedures: Visit Diagnoses:  1. Adhesive capsulitis of left shoulder     Large Joint Inj: L glenohumeral on 01/12/2023 10:52 AM Indications: pain Details: 22 G 1.5 in needle, ultrasound-guided anterior approach Medications: 4 mL lidocaine 1 %; 2 mL triamcinolone acetonide 40 MG/ML Outcome: tolerated well, no immediate complications Consent was given by the patient. Patient was prepped and draped in the usual sterile fashion.     I personally saw and evaluated the patient, and participated in the management and treatment plan.  Hazle Nordmann, PA-C Orthopedics  This document was dictated using Conservation officer, historic buildings. A reasonable attempt at proof reading has been made to minimize errors.

## 2023-01-15 ENCOUNTER — Other Ambulatory Visit: Payer: Self-pay

## 2023-01-16 ENCOUNTER — Telehealth: Payer: Medicaid Other | Admitting: Physician Assistant

## 2023-01-16 DIAGNOSIS — J208 Acute bronchitis due to other specified organisms: Secondary | ICD-10-CM | POA: Diagnosis not present

## 2023-01-16 MED ORDER — PROMETHAZINE-DM 6.25-15 MG/5ML PO SYRP
5.0000 mL | ORAL_SOLUTION | Freq: Four times a day (QID) | ORAL | 0 refills | Status: AC | PRN
Start: 2023-01-16 — End: ?

## 2023-01-16 MED ORDER — PREDNISONE 20 MG PO TABS
40.0000 mg | ORAL_TABLET | Freq: Every day | ORAL | 0 refills | Status: AC
Start: 2023-01-16 — End: ?

## 2023-01-16 NOTE — Progress Notes (Signed)
I have spent 5 minutes in review of e-visit questionnaire, review and updating patient chart, medical decision making and response to patient.   Meliyah Simon Cody Ishmel Acevedo, PA-C    

## 2023-01-16 NOTE — Progress Notes (Signed)
E-Visit for Cough  We are sorry that you are not feeling well.  Here is how we plan to help!  Based on your presentation I believe you most likely have A cough due to a virus.  This is called viral bronchitis and is best treated by rest, plenty of fluids and control of the cough.  You may use Ibuprofen or Tylenol as directed to help your symptoms.     I have prescribed a prescription cough syrup to use as directed, along with a short course of steroid.   From your responses in the eVisit questionnaire you describe inflammation in the upper respiratory tract which is causing a significant cough.  This is commonly called Bronchitis and has four common causes:   Allergies Viral Infections Acid Reflux Bacterial Infection Allergies, viruses and acid reflux are treated by controlling symptoms or eliminating the cause. An example might be a cough caused by taking certain blood pressure medications. You stop the cough by changing the medication. Another example might be a cough caused by acid reflux. Controlling the reflux helps control the cough.  USE OF BRONCHODILATOR ("RESCUE") INHALERS: There is a risk from using your bronchodilator too frequently.  The risk is that over-reliance on a medication which only relaxes the muscles surrounding the breathing tubes can reduce the effectiveness of medications prescribed to reduce swelling and congestion of the tubes themselves.  Although you feel brief relief from the bronchodilator inhaler, your asthma may actually be worsening with the tubes becoming more swollen and filled with mucus.  This can delay other crucial treatments, such as oral steroid medications. If you need to use a bronchodilator inhaler daily, several times per day, you should discuss this with your provider.  There are probably better treatments that could be used to keep your asthma under control.     HOME CARE Only take medications as instructed by your medical team. Complete the entire  course of an antibiotic. Drink plenty of fluids and get plenty of rest. Avoid close contacts especially the very young and the elderly Cover your mouth if you cough or cough into your sleeve. Always remember to wash your hands A steam or ultrasonic humidifier can help congestion.   GET HELP RIGHT AWAY IF: You develop worsening fever. You become short of breath You cough up blood. Your symptoms persist after you have completed your treatment plan MAKE SURE YOU  Understand these instructions. Will watch your condition. Will get help right away if you are not doing well or get worse.    Thank you for choosing an e-visit.  Your e-visit answers were reviewed by a board certified advanced clinical practitioner to complete your personal care plan. Depending upon the condition, your plan could have included both over the counter or prescription medications.  Please review your pharmacy choice. Make sure the pharmacy is open so you can pick up prescription now. If there is a problem, you may contact your provider through [employer] and have the prescription routed to another pharmacy.  Your safety is important to [place]. If you have drug allergies check your prescription carefully.   For the next 24 hours you can use MyChart to ask questions about today's visit, request a non-urgent call back, or ask for a work or school excuse. You will get an email in the next two days asking about your experience. I hope that your e-visit has been valuable and will speed your recovery.

## 2023-01-17 ENCOUNTER — Ambulatory Visit (HOSPITAL_COMMUNITY)
Admission: RE | Admit: 2023-01-17 | Discharge: 2023-01-17 | Disposition: A | Discharge status: Home / Self Care | Payer: Medicaid Other | Source: Ambulatory Visit | Attending: Nurse Practitioner | Admitting: Nurse Practitioner

## 2023-01-17 ENCOUNTER — Other Ambulatory Visit: Payer: Self-pay

## 2023-01-17 ENCOUNTER — Ambulatory Visit (INDEPENDENT_AMBULATORY_CARE_PROVIDER_SITE_OTHER): Payer: Medicaid Other | Admitting: Nurse Practitioner

## 2023-01-17 ENCOUNTER — Encounter: Payer: Self-pay | Admitting: Nurse Practitioner

## 2023-01-17 VITALS — BP 118/83 | HR 88 | Temp 97.2°F | Wt 204.0 lb

## 2023-01-17 DIAGNOSIS — M79605 Pain in left leg: Secondary | ICD-10-CM | POA: Insufficient documentation

## 2023-01-17 DIAGNOSIS — F17200 Nicotine dependence, unspecified, uncomplicated: Secondary | ICD-10-CM

## 2023-01-17 DIAGNOSIS — M79604 Pain in right leg: Secondary | ICD-10-CM | POA: Insufficient documentation

## 2023-01-17 DIAGNOSIS — E1165 Type 2 diabetes mellitus with hyperglycemia: Secondary | ICD-10-CM | POA: Diagnosis not present

## 2023-01-17 DIAGNOSIS — G8929 Other chronic pain: Secondary | ICD-10-CM | POA: Diagnosis present

## 2023-01-17 DIAGNOSIS — M79661 Pain in right lower leg: Secondary | ICD-10-CM | POA: Diagnosis present

## 2023-01-17 NOTE — Assessment & Plan Note (Signed)
Smokes cigarettes occasionally Need to quit smoking cigarettes discussed

## 2023-01-17 NOTE — Progress Notes (Signed)
-  Acute Office Visit  Subjective:     Patient ID: Shannon Oneal, female    DOB: Apr 07, 1981, 42 y.o.   MRN: [id number]  Chief Complaint  Patient presents with   Ankle Pain    right    HPI Shannon Oneal  has a past medical history of Acute deep vein thrombosis (DVT) of right lower extremity (HCC) (01/18/2021), Acute pulmonary embolism (HCC) (01/19/2021), Asthma, Cellulitis in diabetic foot (HCC), Diabetes mellitus without complication (HCC), DVT (deep venous thrombosis) (HCC) (02/05/2021), DVT (deep venous thrombosis) (HCC), and PE (pulmonary thromboembolism) (HCC) (2016).   Patient presents with complaints of bilateral lower extremity discoloration, right calf pain, bilateral foot pain.  Has constant tingling numbness sharp pain in both feet, has sharp pain in her right calf area that has recently gotten worse.  States that the pain in her lower extremities wakes her up at night.  Has history of DVT of the right lower extremity.  She is currently on  eliquis 5 mg twice daily reports compliance with this medication.  She smokes cigarettes occasionally.  She is worried that she might of developed another blood clot due to her increased pain on her right calf which is not normal.  She denies venous ulcerations but is worried about her skin discoloration.        Review of Systems  Constitutional:  Negative for activity change, appetite change, chills, fatigue and fever.  HENT:  Negative for congestion, dental problem, ear discharge, ear pain, hearing loss, rhinorrhea, sinus pressure, sinus pain, sneezing and sore throat.   Eyes: Negative.   Respiratory:  Negative for cough, chest tightness, shortness of breath and wheezing.   Cardiovascular:  Positive for leg swelling. Negative for chest pain and palpitations.  Gastrointestinal:  Negative for abdominal distention, abdominal pain, anal bleeding, blood in stool, constipation, diarrhea, nausea, rectal pain and vomiting.  Endocrine: Negative for  cold intolerance, heat intolerance, polydipsia, polyphagia and polyuria.  Genitourinary:  Negative for difficulty urinating, dysuria, flank pain, frequency, hematuria, menstrual problem, pelvic pain and vaginal bleeding.  Musculoskeletal:  Negative for arthralgias, back pain, gait problem, joint swelling and myalgias.  Skin:  Positive for color change. Negative for pallor, rash and wound.  Neurological:  Negative for dizziness, tremors, facial asymmetry, weakness and headaches.  Hematological:  Negative for adenopathy. Does not bruise/bleed easily.  Psychiatric/Behavioral:  Negative for agitation, behavioral problems, confusion, decreased concentration, hallucinations, self-injury and suicidal ideas.         Objective:    BP 118/83   Pulse 88   Temp (!) 97.2 F (36.2 C)   Wt 204 lb (92.5 kg)   SpO2 100%   BMI 32.93 kg/m    Physical Exam Vitals and nursing note reviewed.  Constitutional:      General: She is not in acute distress.    Appearance: Normal appearance. She is obese. She is not ill-appearing, toxic-appearing or diaphoretic.  HENT:     Mouth/Throat:     Mouth: Mucous membranes are moist.     Pharynx: Oropharynx is clear. No oropharyngeal exudate or posterior oropharyngeal erythema.  Eyes:     General: No scleral icterus.       Right eye: No discharge.        Left eye: No discharge.     Extraocular Movements: Extraocular movements intact.     Conjunctiva/sclera: Conjunctivae normal.  Cardiovascular:     Rate and Rhythm: Normal rate and regular rhythm.     Pulses: Normal pulses.  Heart sounds: Normal heart sounds. No murmur heard.    No friction rub. No gallop.  Pulmonary:     Effort: Pulmonary effort is normal. No respiratory distress.     Breath sounds: Normal breath sounds. No stridor. No wheezing, rhonchi or rales.  Chest:     Chest wall: No tenderness.  Abdominal:     General: There is no distension.     Palpations: Abdomen is soft.     Tenderness:  There is no abdominal tenderness. There is no right CVA tenderness, left CVA tenderness or guarding.  Musculoskeletal:        General: Tenderness present. No swelling, deformity or signs of injury.     Right lower leg: No edema.     Left lower leg: No edema.     Comments: Has palpable but diminished pedal pulse and sensation on the right  lower extremity +2 Palpable pedal pulse on the left, reports decreased sensation on the left feet but stronger than the right Hyperpigmented skin noted on bilateral lower extremities No swelling or redness noted Has Tenderness on palpation right calf area  Skin:    General: Skin is warm and dry.     Coloration: Skin is not jaundiced or pale.     Findings: No bruising, erythema or lesion.  Neurological:     Mental Status: She is alert and oriented to person, place, and time.     Motor: No weakness.     Coordination: Coordination normal.     Gait: Gait normal.  Psychiatric:        Mood and Affect: Mood normal.        Behavior: Behavior normal.        Thought Content: Thought content normal.        Judgment: Judgment normal.     No results found for any visits on 01/17/23.      Assessment & Plan:   Problem List Items Addressed This Visit       Endocrine   Type 2 diabetes mellitus with hyperglycemia (HCC)    Lab Results  Component Value Date   HGBA1C 5.3 11/30/2022  Currently well-controlled on metformin 1000 mg twice daily, Ozempic 1 mg weekly Continue current medications Patient counseled on low-carb modified diet      Relevant Orders   Ambulatory referral to Vascular Surgery     Other   Right calf pain - Primary    Start venous ultrasound ordered to rule out DVT Continue Eliquis 5 mg twice daily      Relevant Orders   VAS [place] LOWER EXTREMITY VENOUS (DVT)   Chronic pain of lower extremity, bilateral    Due to her history of diabetes, occasional tobacco smoker Patient referred to vascular to rule out PAD      Relevant Orders    Ambulatory referral to Vascular Surgery   VAS [place] LOWER EXTREMITY VENOUS (DVT)   Current smoker    Smokes cigarettes occasionally Need to quit smoking cigarettes discussed      Relevant Orders   Ambulatory referral to Vascular Surgery    No orders of the defined types were placed in this encounter.   No follow-ups on file.  Donell Beers, FNP

## 2023-01-17 NOTE — Assessment & Plan Note (Signed)
Start venous ultrasound ordered to rule out DVT Continue Eliquis 5 mg twice daily

## 2023-01-17 NOTE — Assessment & Plan Note (Signed)
Due to her history of diabetes, occasional tobacco smoker Patient referred to vascular to rule out PAD

## 2023-01-17 NOTE — Progress Notes (Signed)
Lower extremity venous right study completed.  Preliminary results relayed to [place] for [place], [place].   See CV Proc for preliminary results report.   Jean Rosenthal, RDMS, RVT

## 2023-01-17 NOTE — Progress Notes (Signed)
-   Findings consistent with chronic deep vein thrombosis involving the right popliteal vein. Please continue Eliquis 5 mg twice daily Follow-up with vascular for PAD testing

## 2023-01-17 NOTE — Assessment & Plan Note (Signed)
Lab Results  Component Value Date   HGBA1C 5.3 11/30/2022  Currently well-controlled on metformin 1000 mg twice daily, Ozempic 1 mg weekly Continue current medications Patient counseled on low-carb modified diet

## 2023-01-17 NOTE — Patient Instructions (Signed)
1. Right calf pain  - US Venous Img Lower Unilateral Right (DVT); Future  2. Chronic pain of lower extremity, bilateral  - Ambulatory referral to Vascular Surgery  3. Current smoker  - Ambulatory referral to Vascular Surgery  4. Type 2 diabetes mellitus with hyperglycemia, without long-term current use of insulin (HCC)  - Ambulatory referral to Vascular Surgery     It is important that you exercise regularly at least 30 minutes 5 times a week as tolerated  Think about what you will eat, plan ahead. Choose " clean, green, fresh or frozen" over canned, processed or packaged foods which are more sugary, salty and fatty. 70 to 75% of food eaten should be vegetables and fruit. Three meals at set times with snacks allowed between meals, but they must be fruit or vegetables. Aim to eat over a 12 hour period , example 7 am to 7 pm, and STOP after  your last meal of the day. Drink water,generally about 64 ounces per day, no other drink is as healthy. Fruit juice is best enjoyed in a healthy way, by EATING the fruit.  Thanks for choosing [facility] we consider it a privelige to serve you.

## 2023-01-24 ENCOUNTER — Ambulatory Visit: Payer: Self-pay | Admitting: Nurse Practitioner

## 2023-02-08 ENCOUNTER — Telehealth: Payer: Medicaid Other | Admitting: Physician Assistant

## 2023-02-08 DIAGNOSIS — S0991XA Unspecified injury of ear, initial encounter: Secondary | ICD-10-CM

## 2023-02-08 NOTE — Progress Notes (Signed)
Because of crushing trauma to the ear and level of pain noted, I feel your condition warrants further evaluation and I recommend that you be seen in a face to face visit.   NOTE: There will be NO CHARGE for this eVisit   If you are having a true medical emergency please call 911.      For an urgent face to face visit, [place] has eight urgent care centers for your convenience:   NEW!! [facility] Health Urgent Care Center at [facility] Get Driving Directions [phone] [address] [place], [address]    [facility] Health Urgent Care Center at [facility] Get Driving Directions [phone] [address] [place], [place] [address]   [facility] Health Urgent Care Center [facility]) Get Driving Directions [phone] [address] [place], [place] [address]  [facility] Health Urgent Care Center [facility]) Get Driving Directions [phone] [address] [place],  [place]  [address]  [facility] Health Urgent Care Center [facility] - at [employer]  [phone] [address] [place],  [place] [address]   [facility] Health Urgent Care at [facility] Get Driving Directions [phone] [address], [address] [place], [place] [address]   [facility] Health Urgent Care at [facility] Get Driving Directions  [phone] [address] [place], [place] [address]   [facility] Health Urgent Care at [facility] Directions [phone] [address], [address] [place], [place] [address]  Your MyChart E-visit questionnaire answers were reviewed by a board certified advanced clinical practitioner to complete your personal care plan based on your specific symptoms.  Thank you for using e-Visits.

## 2023-02-19 ENCOUNTER — Other Ambulatory Visit: Payer: Self-pay

## 2023-02-19 ENCOUNTER — Other Ambulatory Visit: Payer: Self-pay | Admitting: Nurse Practitioner

## 2023-02-19 MED ORDER — OZEMPIC (1 MG/DOSE) 4 MG/3ML ~~LOC~~ SOPN
1.0000 mg | PEN_INJECTOR | SUBCUTANEOUS | 2 refills | Status: DC
  Filled 2023-02-19: qty 9, 84d supply, fill #0
  Filled 2023-02-19: qty 3, 28d supply, fill #0

## 2023-02-23 ENCOUNTER — Other Ambulatory Visit: Payer: Self-pay

## 2023-02-28 ENCOUNTER — Telehealth: Payer: Medicaid Other | Admitting: Physician Assistant

## 2023-02-28 DIAGNOSIS — N921 Excessive and frequent menstruation with irregular cycle: Secondary | ICD-10-CM

## 2023-02-28 NOTE — Progress Notes (Signed)
Because this is not something we can evaluate or treat via e-visit or virtual urgent care visit, I feel your condition warrants further evaluation and I recommend that you be seen in a face to face visit.   NOTE: There will be NO CHARGE for this eVisit   If you are having a true medical emergency please call 911.      For an urgent face to face visit, [facility] has eight urgent care centers for your convenience:   NEW!! [facility] at [facility] Get Driving Directions [phone] [address], [address] [place], [address]    [facility] at [facility] Get Driving Directions [phone] [address] [place], [place] [address]   [facility]gent Care Center [facility]) Get Driving Directions [phone] [address] [place], [place] [address]  [facility]gent [facility] Center [facility]) Get Driving Directions [phone] [address] [place],  [place]  [address]  [facility] - at [employer]  [phone] [address] [place],  [place] [address]   [facility] Ur[facility] Care at [facility] Get Driving Directions [phone] [address], [address] [place], [place] [address]   [facility] Urgent Care at [facility] Get Driving Directions  [phone] [address] [place], [place] [address]   [facility] at [facility] Directions [phone] [address], [address] [place], [place] [address]  Your MyChart E-visit questionnaire answers were reviewed by a board certified advanced clinical practitioner to complete your personal care plan based on your specific symptoms.  Thank you for using e-Visits.

## 2023-03-02 ENCOUNTER — Ambulatory Visit (HOSPITAL_COMMUNITY)
Admission: RE | Admit: 2023-03-02 | Discharge: 2023-03-02 | Disposition: A | Discharge status: Home / Self Care | Payer: Medicaid Other | Source: Ambulatory Visit | Attending: Nurse Practitioner | Admitting: Nurse Practitioner

## 2023-03-02 ENCOUNTER — Encounter: Payer: Self-pay | Admitting: Nurse Practitioner

## 2023-03-02 ENCOUNTER — Ambulatory Visit (INDEPENDENT_AMBULATORY_CARE_PROVIDER_SITE_OTHER): Payer: Medicaid Other | Admitting: Nurse Practitioner

## 2023-03-02 VITALS — BP 121/87 | HR 86 | Temp 98.1°F | Resp 12 | Ht 66.0 in | Wt 202.6 lb

## 2023-03-02 DIAGNOSIS — E1165 Type 2 diabetes mellitus with hyperglycemia: Secondary | ICD-10-CM | POA: Diagnosis not present

## 2023-03-02 DIAGNOSIS — R21 Rash and other nonspecific skin eruption: Secondary | ICD-10-CM

## 2023-03-02 DIAGNOSIS — N939 Abnormal uterine and vaginal bleeding, unspecified: Secondary | ICD-10-CM | POA: Diagnosis not present

## 2023-03-02 DIAGNOSIS — Z794 Long term (current) use of insulin: Secondary | ICD-10-CM | POA: Diagnosis not present

## 2023-03-02 DIAGNOSIS — R600 Localized edema: Secondary | ICD-10-CM | POA: Insufficient documentation

## 2023-03-02 LAB — POCT GLYCOSYLATED HEMOGLOBIN (HGB A1C): HbA1c, POC (controlled diabetic range): 5.6 % (ref 0.0–7.0)

## 2023-03-02 MED ORDER — FUROSEMIDE 20 MG PO TABS
20.0000 mg | ORAL_TABLET | Freq: Every day | ORAL | 0 refills | Status: AC
Start: 2023-03-02 — End: ?

## 2023-03-02 MED ORDER — FUROSEMIDE 20 MG PO TABS
20.0000 mg | ORAL_TABLET | Freq: Every day | ORAL | 3 refills | Status: DC
Start: 2023-03-02 — End: 2023-03-02

## 2023-03-02 MED ORDER — TRIAMCINOLONE ACETONIDE 0.5 % EX OINT: 1.0000 | TOPICAL_OINTMENT | Freq: Two times a day (BID) | CUTANEOUS | 0 refills | Status: DC

## 2023-03-02 NOTE — Progress Notes (Signed)
Pt is here for menstrual issues   Complaining of bleeding for X1 month
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
## 2023-03-02 NOTE — Progress Notes (Signed)
Lower extremity venous duplex completed. Please see CV Procedures for preliminary results.  Initial findings reported to Angus Seller, NP.  Shona Simpson, RVT 03/02/23 4:28 PM

## 2023-03-02 NOTE — Assessment & Plan Note (Signed)
-   Ambulatory referral to Obstetrics / Gynecology   2. Type 2 diabetes mellitus with hyperglycemia, with long-term current use of insulin (HCC)  - Microalbumin/Creatinine Ratio, Urine - POCT glycosylated hemoglobin (Hb A1C) - CBC - Comprehensive metabolic panel   3. Peripheral edema  - furosemide (LASIX) 20 MG tablet; Take 1 tablet (20 mg total) by mouth daily.  Dispense: 30 tablet; Refill: 3 - Brain natriuretic peptide - VAS [place] LOWER EXTREMITY VENOUS (DVT); Future  4. Rash  - triamcinolone ointment (KENALOG) 0.5 %; Apply 1 Application topically 2 (two) times daily.  Dispense: 30 g; Refill: 0    Follow up:  Follow up in 6 months

## 2023-03-02 NOTE — Patient Instructions (Addendum)
1. Abnormal uterine bleeding  - Ambulatory referral to Obstetrics / Gynecology   2. Type 2 diabetes mellitus with hyperglycemia, with long-term current use of insulin (HCC)  - Microalbumin/Creatinine Ratio, Urine - POCT glycosylated hemoglobin (Hb A1C) - CBC - Comprehensive metabolic panel   3. Peripheral edema  - furosemide (LASIX) 20 MG tablet; Take 1 tablet (20 mg total) by mouth daily.  Dispense: 30 tablet; Refill: 3 - Brain natriuretic peptide - VAS [place] LOWER EXTREMITY VENOUS (DVT); Future  4. Rash  - triamcinolone ointment (KENALOG) 0.5 %; Apply 1 Application topically 2 (two) times daily.  Dispense: 30 g; Refill: 0    Follow up:  Follow up in 6 months

## 2023-03-06 ENCOUNTER — Telehealth: Payer: Self-pay | Admitting: Nurse Practitioner

## 2023-03-06 NOTE — Telephone Encounter (Signed)
Pt called stating she is still having the menstrual issues she discussed with Tonya at her visit last Friday and it has been almost 2 months now.   I let her know that you put a referral in for OBGYN for her and gave her the information to the referral for her to call them and get herself scheduled.

## 2023-03-13 ENCOUNTER — Telehealth: Payer: Medicaid Other | Admitting: Emergency Medicine

## 2023-03-13 DIAGNOSIS — K047 Periapical abscess without sinus: Secondary | ICD-10-CM | POA: Diagnosis not present

## 2023-03-13 MED ORDER — CLINDAMYCIN HCL 300 MG PO CAPS: 300.0000 mg | ORAL_CAPSULE | Freq: Three times a day (TID) | ORAL | 0 refills | Status: AC

## 2023-03-13 NOTE — Progress Notes (Signed)
E-Visit for Dental Pain  We are sorry that you are not feeling well.  Here is how we plan to help!  Based on what you have shared with me in the questionnaire, it sounds like you have a dental infection.   Clindamycin 300mg  3 times a day for 7 days  Continue taking tylenol per package instructions for pain.   It is imperative that you see a dentist within 10 days of this eVisit to determine the cause of the dental pain and be sure it is adequately treated  A toothache or tooth pain is caused when the nerve in the root of a tooth or surrounding a tooth is irritated. Dental (tooth) infection, decay, injury, or loss of a tooth are the most common causes of dental pain. Pain may also occur after an extraction (tooth is pulled out). Pain sometimes originates from other areas and radiates to the jaw, thus appearing to be tooth pain.Bacteria growing inside your mouth can contribute to gum disease and dental decay, both of which can cause pain. A toothache occurs from inflammation of the central portion of the tooth called pulp. The pulp contains nerve endings that are very sensitive to pain. Inflammation to the pulp or pulpitis may be caused by dental cavities, trauma, and infection.    HOME CARE:   For toothaches: Over-the-counter pain medications such as acetaminophen or ibuprofen may be used. Take these as directed on the package while you arrange for a dental appointment. Avoid very cold or hot foods, because they may make the pain worse. You may get relief from biting on a cotton ball soaked in oil of cloves. You can get oil of cloves at most drug stores.  For jaw pain:  Aspirin may be helpful for problems in the joint of the jaw in adults. If pain happens every time you open your mouth widely, the temporomandibular joint (TMJ) may be the source of the pain. Yawning or taking a large bite of food may worsen the pain. An appointment with your doctor or dentist will help you find the cause.      GET HELP RIGHT AWAY IF:  You have a high fever or chills If you have had a recent head or face injury and develop headache, light headedness, nausea, vomiting, or other symptoms that concern you after an injury to your face or mouth, you could have a more serious injury in addition to your dental injury. A facial rash associated with a toothache: This condition may improve with medication. Contact your doctor for them to decide what is appropriate. Any jaw pain occurring with chest pain: Although jaw pain is most commonly caused by dental disease, it is sometimes referred pain from other areas. People with heart disease, especially people who have had stents placed, people with diabetes, or those who have had heart surgery may have jaw pain as a symptom of heart attack or angina. If your jaw or tooth pain is associated with lightheadedness, sweating, or shortness of breath, you should see a doctor as soon as possible. Trouble swallowing or excessive pain or bleeding from gums: If you have a history of a weakened immune system, diabetes, or steroid use, you may be more susceptible to infections. Infections can often be more severe and extensive or caused by unusual organisms. Dental and gum infections in people with these conditions may require more aggressive treatment. An abscess may need draining or IV antibiotics, for example.  MAKE SURE YOU   Understand these instructions.  Will watch your condition. Will get help right away if you are not doing well or get worse.  Thank you for choosing an e-visit.  Your e-visit answers were reviewed by a board certified advanced clinical practitioner to complete your personal care plan. Depending upon the condition, your plan could have included both over the counter or prescription medications.  Please review your pharmacy choice. Make sure the pharmacy is open so you can pick up prescription now. If there is a problem, you may contact your provider  through [employer] and have the prescription routed to another pharmacy.  Your safety is important to [place]. If you have drug allergies check your prescription carefully.   For the next 24 hours you can use MyChart to ask questions about today's visit, request a non-urgent call back, or ask for a work or school excuse. You will get an email in the next two days asking about your experience. I hope that your e-visit has been valuable and will speed your recovery.  I have spent 5 minutes in review of e-visit questionnaire, review and updating patient chart, medical decision making and response to patient.   Rica Mast, PhD, FNP-BC

## 2023-03-20 ENCOUNTER — Ambulatory Visit (INDEPENDENT_AMBULATORY_CARE_PROVIDER_SITE_OTHER): Payer: Medicaid Other | Admitting: Clinical

## 2023-03-20 DIAGNOSIS — Z59819 Housing instability, housed unspecified: Secondary | ICD-10-CM

## 2023-03-20 NOTE — BH Specialist Note (Unsigned)
Rat problem at old apt  New apt ready mid Sept in [place]- haven't signed lease yet $1300 deposit  Security is 300 Pet deposit 200 First month prorated 720  Receive food stamps $160 month  Just started working last month - [employer] $17/hr  [employer] at the old apt so keeping the utilities on Husband gets disability 1500/month - minus Medicare premium   $350 car payment $100 car insurance Also paying storage fees Helping young adult sons Apply with [employer]  [place] triad facebook page - follows for community resource tips - haven't been able to get help at [employer] or [employer], nothing at [employer] haven't been able to help  [employer] properties sent someone to treat outside  Owner is an older lady who lives out of state  [place] and [place] now, but since working will likely not qualify after Dec, will have to change insurance (used to work for [employer] so knows Systems developer)

## 2023-03-30 ENCOUNTER — Other Ambulatory Visit: Payer: Self-pay | Admitting: Nurse Practitioner

## 2023-03-30 ENCOUNTER — Other Ambulatory Visit: Payer: Self-pay

## 2023-03-30 MED ORDER — APIXABAN 5 MG PO TABS
5.0000 mg | ORAL_TABLET | Freq: Two times a day (BID) | ORAL | 5 refills | Status: AC
  Filled 2023-03-30: qty 60, 30d supply, fill #0
  Filled 2023-04-30 – 2023-05-26 (×2): qty 60, 30d supply, fill #1
  Filled 2023-08-09 – 2023-08-13 (×2): qty 60, 30d supply, fill #2
  Filled 2023-09-14: qty 60, 30d supply, fill #3

## 2023-04-04 ENCOUNTER — Other Ambulatory Visit: Payer: Self-pay

## 2023-04-06 ENCOUNTER — Telehealth: Payer: Self-pay | Admitting: Clinical

## 2023-04-06 NOTE — Telephone Encounter (Signed)
Integrated Behavioral Health Progress Note  04/06/2023 Name: Shannon Oneal MRN: [id number] DOB: 1980/09/26 [name] is a 42 y.o. year old female who sees Ivonne Andrew, NP for primary care. LCSW was consulted to assess patient's needs and assist the patient with [employer]   Interpreter: No.   Interpreter Name & Language: none  Assessment: Patient experiencing housing barriers and financial difficulties.   Ongoing Intervention: Patient was approved for assistance on her rental deposit/first month's rent from the [facility]. CSW delivered this payment to patient's property management office today and advised patient that this was completed.   Abigail Butts, LCSW [facility] [phone]

## 2023-04-10 ENCOUNTER — Ambulatory Visit
Admission: RE | Admit: 2023-04-10 | Discharge: 2023-04-10 | Disposition: A | Discharge status: Home / Self Care | Payer: Medicaid Other | Source: Ambulatory Visit | Attending: Family Medicine | Admitting: Family Medicine

## 2023-04-10 ENCOUNTER — Ambulatory Visit (INDEPENDENT_AMBULATORY_CARE_PROVIDER_SITE_OTHER): Payer: Medicaid Other

## 2023-04-10 VITALS — BP 118/81 | HR 95 | Temp 98.1°F | Resp 16

## 2023-04-10 DIAGNOSIS — L97529 Non-pressure chronic ulcer of other part of left foot with unspecified severity: Secondary | ICD-10-CM | POA: Diagnosis not present

## 2023-04-10 DIAGNOSIS — L03115 Cellulitis of right lower limb: Secondary | ICD-10-CM | POA: Diagnosis not present

## 2023-04-10 MED ORDER — DOXYCYCLINE HYCLATE 100 MG PO CAPS: 100.0000 mg | ORAL_CAPSULE | Freq: Two times a day (BID) | ORAL | 0 refills | Status: AC

## 2023-04-10 NOTE — ED Provider Notes (Signed)
[facility]    CSN: [id number] Arrival date & time: 04/10/23  1732      History   Chief Complaint Chief Complaint  Patient presents with   Foot Pain    HPI Shannon Oneal is a 42 y.o. female.   HPI 42 year old female presents with left foot ulceration for 3 weeks.  Patient reports tried to file down the callus several weeks ago.  PMH significant for acute DVT, acute PE and T2 DM without complication.  Patient is currently on Apixaban and denies any unusual bleeding.  Past Medical History:  Diagnosis Date   Acute deep vein thrombosis (DVT) of right lower extremity (HCC) 01/18/2021   Acute pulmonary embolism (HCC) 01/19/2021   Asthma    Cellulitis in diabetic foot (HCC)    Diabetes mellitus without complication (HCC)    DVT (deep venous thrombosis) (HCC) 02/05/2021   DVT (deep venous thrombosis) (HCC)    PE (pulmonary thromboembolism) (HCC) 2016    Patient Active Problem List   Diagnosis Date Noted   Abnormal uterine bleeding 03/02/2023   Right calf pain 01/17/2023   Chronic pain of lower extremity, bilateral 01/17/2023   Current smoker 01/17/2023   Acute pain of left shoulder 12/28/2022   Acute pain of right shoulder 12/28/2022   Routine adult health maintenance 03/01/2022   Drug-induced constipation 05/24/2021   Diabetic neuropathy (HCC) 05/24/2021   Acute DVT (deep venous thrombosis) (HCC) 05/10/2021   Bilateral pulmonary embolism (HCC) 02/04/2021   Deep vein thrombosis (DVT) of right lower extremity (HCC) 02/04/2021   Right leg pain 02/04/2021   Type 2 diabetes mellitus with hyperglycemia (HCC) 01/18/2021    Past Surgical History:  Procedure Laterality Date   adenoids     CESAREAN SECTION      OB History   No obstetric history on file.      Home Medications    Prior to Admission medications   Medication Sig Start Date End Date Taking? Authorizing Provider  doxycycline (VIBRAMYCIN) 100 MG capsule Take 1 capsule (100 mg total) by mouth 2  (two) times daily for 10 days. 04/10/23 04/20/23 Yes Trevor Iha, FNP  Accu-Chek Softclix Lancets lancets Use as instructed 03/01/22   Ivonne Andrew, NP  albuterol (VENTOLIN HFA) 108 (90 Base) MCG/ACT inhaler Inhale 2 puffs into the lungs every 6 (six) hours as needed for wheezing or shortness of breath. 01/26/22   Ivonne Andrew, NP  apixaban (ELIQUIS) 5 MG TABS tablet Take 1 tablet (5 mg total) by mouth 2 (two) times daily. 03/30/23   Ivonne Andrew, NP  blood glucose meter kit and supplies KIT Dispense based on patient and insurance preference. Use up to four times daily as directed. 03/01/22   Ivonne Andrew, NP  Erenumab-aooe (AIMOVIG) 140 MG/ML SOAJ Inject 140 mg into the skin every 28 (twenty-eight) days. 09/01/22   Drema Dallas, DO  furosemide (LASIX) 20 MG tablet Take 1 tablet (20 mg total) by mouth daily. 03/02/23   Ivonne Andrew, NP  glucose blood (ACCU-CHEK GUIDE) test strip Use as instructed 03/01/22   Ivonne Andrew, NP  lidocaine (LIDODERM) 5 % Place 1 patch onto the skin daily. Remove & Discard patch within 12 hours or as directed by MD Patient not taking: Reported on 01/17/2023 11/30/22   Ivonne Andrew, NP  metFORMIN (GLUCOPHAGE-XR) 500 MG 24 hr tablet Take 2 tablets (1,000 mg total) by mouth 2 (two) times daily. 09/01/22   Ivonne Andrew, NP  ondansetron (  ZOFRAN-ODT) 4 MG disintegrating tablet Take 1 tablet (4 mg total) by mouth every 8 (eight) hours as needed for nausea or vomiting. 01/10/23   Drema Dallas, DO  predniSONE (DELTASONE) 20 MG tablet Take 2 tablets (40 mg total) by mouth daily with breakfast. Patient not taking: Reported on 01/17/2023 01/16/23   Waldon Merl, PA-C  promethazine-dextromethorphan (PROMETHAZINE-DM) 6.25-15 MG/5ML syrup Take 5 mLs by mouth 4 (four) times daily as needed for cough. 01/16/23   Waldon Merl, PA-C  Semaglutide, 1 MG/DOSE, (OZEMPIC, 1 MG/DOSE,) 4 MG/3ML SOPN Inject 1 mg as directed once a week. 02/19/23   Ivonne Andrew, NP   triamcinolone ointment (KENALOG) 0.5 % Apply 1 Application topically 2 (two) times daily. 03/02/23   Ivonne Andrew, NP  diphenhydrAMINE (BENADRYL) 25 mg capsule Take 1 capsule (25 mg total) by mouth every 6 (six) hours as needed for itching. 01/22/21 02/05/21  Noralee Stain, DO  metoCLOPramide (REGLAN) 10 MG tablet Take 1 tablet (10 mg total) by mouth every 6 (six) hours as needed (Nausea or headache). Patient not taking: Reported on 10/12/2017 02/18/13 10/31/19  Dione Booze, MD  promethazine (PHENERGAN) 25 MG tablet Take 1 tablet (25 mg total) by mouth every 6 (six) hours as needed for nausea or vomiting (headache). Patient not taking: No sig reported 06/04/20 01/18/21  Charlynne Pander, MD    Family History Family History  Problem Relation Age of Onset   Deep vein thrombosis Father    Breast cancer Sister 11    Social History Social History   Tobacco Use   Smoking status: Never   Smokeless tobacco: Never  Vaping Use   Vaping status: Former   Substances: CBD  Substance Use Topics   Alcohol use: Yes   Drug use: Never     Allergies   Amoxapine and related, Amoxicillin, Oxycodone, Percocet [oxycodone-acetaminophen], Morphine, Rocephin [ceftriaxone], and Tramadol   Review of Systems Review of Systems  Skin:  Positive for wound.     Physical Exam Triage Vital Signs ED Triage Vitals  Encounter Vitals Group     BP 04/10/23 1746 118/81     Systolic BP Percentile --      Diastolic BP Percentile --      Pulse Rate 04/10/23 1746 95     Resp 04/10/23 1746 16     Temp 04/10/23 1746 98.1 F (36.7 C)     Temp Source 04/10/23 1746 Oral     SpO2 04/10/23 1746 99 %     Weight --      Height --      Head Circumference --      Peak Flow --      Pain Score 04/10/23 1744 7     Pain Loc --      Pain Education --      Exclude from Growth Chart --    No data found.  Updated Vital Signs BP 118/81 (BP Location: Right Arm)   Pulse 95   Temp 98.1 F (36.7 C) (Oral)   Resp 16    SpO2 99%   Physical Exam Vitals and nursing note reviewed.  Constitutional:      Appearance: Normal appearance. She is normal weight.  HENT:     Head: Normocephalic and atraumatic.     Mouth/Throat:     Mouth: Mucous membranes are moist.     Pharynx: Oropharynx is clear.  Eyes:     Extraocular Movements: Extraocular movements intact.     Conjunctiva/sclera:  Conjunctivae normal.     Pupils: Pupils are equal, round, and reactive to light.  Cardiovascular:     Rate and Rhythm: Normal rate and regular rhythm.     Pulses: Normal pulses.     Heart sounds: Normal heart sounds.  Pulmonary:     Effort: Pulmonary effort is normal.     Breath sounds: Normal breath sounds. No wheezing, rhonchi or rales.  Musculoskeletal:        General: Normal range of motion.     Cervical back: Normal range of motion and neck supple.  Skin:    General: Skin is warm and dry.     Comments: Left foot (sole just beneath first MTP): Mildly erythematous skin ulceration noted-please see image below  Neurological:     General: No focal deficit present.     Mental Status: She is alert and oriented to person, place, and time. Mental status is at baseline.  Psychiatric:        Mood and Affect: Mood normal.        Behavior: Behavior normal.        Thought Content: Thought content normal.      UC Treatments / Results  Labs (all labs ordered are listed, but only abnormal results are displayed) Labs Reviewed - No data to display  EKG   Radiology DG Foot Complete Left  Result Date: 04/10/2023 CLINICAL DATA:  Foot ulceration first metatarsophalangeal joint EXAM: LEFT FOOT - COMPLETE 3+ VIEW COMPARISON:  11/17/2012 FINDINGS: Frontal, oblique, lateral views of the left foot are obtained. No acute displaced fracture. Stable hallux valgus deformity, with mild osteoarthritis of the first metatarsophalangeal joint. No destructive bony abnormalities. Soft tissue swelling within the dorsal medial aspect of the left  foot. No evidence of subcutaneous gas or radiopaque foreign body. IMPRESSION: 1. Dorsal and medial soft tissue swelling. No subcutaneous gas or radiopaque foreign body. No evidence of osteomyelitis. Electronically Signed   By: Sharlet Salina M.D.   On: 04/10/2023 19:14    Procedures Procedures (including critical care time)  Medications Ordered in UC Medications - No data to display  Initial Impression / Assessment and Plan / UC Course  I have reviewed the triage vital signs and the nursing notes.  Pertinent labs & imaging results that were available during my care of the patient were reviewed by me and considered in my medical decision making (see chart for details).     MDM: 1.  Ulceration of left foot, unspecified ulcer stage-x-ray of left foot revealed above Rx'd Doxycycline 100 mg capsule twice daily x 10 days, patient scheduled with [facility] for 9 AM, Friday, 04/13/23 for further evaluation; 2.  Cellulitis of right foot-Rx'd doxycycline 100 mg capsule twice daily x 10 days. Patient advised of left foot x-ray results.  Advised patient to take medication as directed with food to completion.  Encouraged to increase daily water intake to 64 ounces per day while taking this medication.  Advised patient to follow-up with scheduled [facility] appointment for 9 AM, Friday, 04/13/23 for further evaluation of left foot ulcer.  Discharged home, hemodynamically stable. Final Clinical Impressions(s) / UC Diagnoses   Final diagnoses:  Ulcer of left foot, unspecified ulcer stage (HCC)  Cellulitis of right foot     Discharge Instructions      Patient advised of left foot x-ray results.  Advised patient to take medication as directed with food to completion.  Encouraged to increase daily water intake to 64 ounces per  day while taking this medication.  Advised patient to follow-up with scheduled [facility] appointment for 9 AM, Friday, 04/13/23 for further evaluation of  left foot ulcer.     ED Prescriptions     Medication Sig Dispense Auth. Provider   doxycycline (VIBRAMYCIN) 100 MG capsule Take 1 capsule (100 mg total) by mouth 2 (two) times daily for 10 days. 20 capsule Trevor Iha, FNP      PDMP not reviewed this encounter.   Trevor Iha, FNP 04/10/23 [facility] [place]

## 2023-04-10 NOTE — ED Triage Notes (Signed)
Pt c/o LT foot pain x 3 wks. Says she's had a callus for a few months. Tried to file it down and now thinks its infected. Hx of diabetes and also takes eliquis.

## 2023-04-10 NOTE — Discharge Instructions (Addendum)
Patient advised of left foot x-ray results.  Advised patient to take medication as directed with food to completion.  Encouraged to increase daily water intake to 64 ounces per day while taking this medication.  Advised patient to follow-up with scheduled [facility] appointment for 9 AM, Friday, 04/13/23 for further evaluation of left foot ulcer.

## 2023-04-13 ENCOUNTER — Ambulatory Visit (INDEPENDENT_AMBULATORY_CARE_PROVIDER_SITE_OTHER): Payer: Medicaid Other | Admitting: Podiatry

## 2023-04-13 DIAGNOSIS — E1142 Type 2 diabetes mellitus with diabetic polyneuropathy: Secondary | ICD-10-CM | POA: Diagnosis not present

## 2023-04-13 DIAGNOSIS — L0889 Other specified local infections of the skin and subcutaneous tissue: Secondary | ICD-10-CM | POA: Diagnosis not present

## 2023-04-13 DIAGNOSIS — L97521 Non-pressure chronic ulcer of other part of left foot limited to breakdown of skin: Secondary | ICD-10-CM | POA: Diagnosis not present

## 2023-04-13 NOTE — Progress Notes (Signed)
Subjective:  Patient ID: Shannon Oneal, female    DOB: 1980/11/04,   MRN: [id number]  No chief complaint on file.   42 y.o. female presents for concern of an ulceration on her left foot that started about three weeks ago. Relates she tried to file down a callus and noticed an opening. She was seen in urgent care and placed on doxycyline and advised to follow-up. She is on a blood thinner.  Relates burning and tingling in their feet. Patient is diabetic and last A1c was  Lab Results  Component Value Date   HGBA1C 5.6 03/02/2023   .   PCP:  Ivonne Andrew, NP    . Denies any other pedal complaints. Denies n/v/f/c.   Past Medical History:  Diagnosis Date   Acute deep vein thrombosis (DVT) of right lower extremity (HCC) 01/18/2021   Acute pulmonary embolism (HCC) 01/19/2021   Asthma    Cellulitis in diabetic foot (HCC)    Diabetes mellitus without complication (HCC)    DVT (deep venous thrombosis) (HCC) 02/05/2021   DVT (deep venous thrombosis) (HCC)    PE (pulmonary thromboembolism) (HCC) 2016    Objective:  Physical Exam: Vascular: DP/PT pulses 2/4 bilateral. CFT <3 seconds. Normal hair growth on digits. No edema.  Skin. No lacerations or abrasions bilateral feet. Plantar left first metatarsal ulceration underlying hyperkeratosis. Granular base superficial about 0.2 x 0.2 cm x 0.1 cm No erythema edema or purulence noted.  Musculoskeletal: MMT 5/5 bilateral lower extremities in DF, PF, Inversion and Eversion. Deceased ROM in DF of ankle joint.  Neurological: Sensation intact to light touch.   Assessment:   1. Infected sinus of skin   2. Diabetic polyneuropathy associated with type 2 diabetes mellitus (HCC)      Plan:  Patient was evaluated and treated and all questions answered. Ulcer left plantar first metatarsal limited to breakdown of skin -X-rays reviewed and no signs of osseous erosion.  -Debridement as below. -Dressed with betadine, DSD. -Off-loading with surgical  shoe. Dispensed.  -No abx indicated.  -Discussed glucose control and proper protein-rich diet.  -Discussed if any worsening redness, pain, fever or chills to call or may need to report to the emergency room. Patient expressed understanding.   Procedure: Excisional Debridement of Wound Rationale: Removal of non-viable soft tissue from the wound to promote healing.  Anesthesia: none Pre-Debridement Wound Measurements: overlying callus   Post-Debridement Wound Measurements: 0.2 cm x 0.2 cm x 0.1 cm  Type of Debridement: Sharp Excisional Tissue Removed: Non-viable soft tissue Depth of Debridement: subcutaneous tissue. Technique: Sharp excisional debridement to bleeding, viable wound base.  Dressing: Dry, sterile, compression dressing. Disposition: Patient tolerated procedure well. Patient to return in 2 week for follow-up.  Return in about 2 weeks (around 04/27/2023) for wound check.   Louann Sjogren, DPM

## 2023-04-25 ENCOUNTER — Other Ambulatory Visit (HOSPITAL_COMMUNITY)
Admission: RE | Admit: 2023-04-25 | Discharge: 2023-04-25 | Disposition: A | Discharge status: Home / Self Care | Payer: Medicaid Other | Source: Ambulatory Visit | Attending: Obstetrics and Gynecology | Admitting: Obstetrics and Gynecology

## 2023-04-25 ENCOUNTER — Encounter: Payer: Self-pay | Admitting: Obstetrics and Gynecology

## 2023-04-25 ENCOUNTER — Ambulatory Visit: Payer: Medicaid Other | Admitting: Obstetrics and Gynecology

## 2023-04-25 VITALS — BP 110/85 | HR 80 | Wt 200.0 lb

## 2023-04-25 DIAGNOSIS — Z124 Encounter for screening for malignant neoplasm of cervix: Secondary | ICD-10-CM | POA: Diagnosis present

## 2023-04-25 DIAGNOSIS — Z113 Encounter for screening for infections with a predominantly sexual mode of transmission: Secondary | ICD-10-CM

## 2023-04-25 DIAGNOSIS — N939 Abnormal uterine and vaginal bleeding, unspecified: Secondary | ICD-10-CM | POA: Diagnosis not present

## 2023-04-25 DIAGNOSIS — Z01419 Encounter for gynecological examination (general) (routine) without abnormal findings: Secondary | ICD-10-CM

## 2023-04-25 NOTE — Progress Notes (Unsigned)
NEW GYNECOLOGY PATIENT Patient name: Shannon Oneal MRN [id number]  Date of birth: 05/29/1981 Chief Complaint:   Gynecologic Exam     History:  Mikinzie Maciejewski is a 42 y.o. [id number] being seen today for AUB and dysmenorrhea  Reports having heavy bleeding in July and August x2 montsh. Super tampns and overnight pad and changing every 45 minutes. Will note clots w/ the bleeding. September menses lasted 7 d, heavy as the prolonged bleeding and requiring tampon and pad. Cramping pain and sharp cramping pain w/ bleeding. Takes midol and tylenol. Typically menses are 3-5 days. No pain w/ intercourse. Ok w/ pap + STI testing today. Sexually active - partner HIV pos; condoms for contraception.     As an adult she may have irregularity at baseline but not typically too painful. No changes in medication.   Was on ozempic for at least 1 year prior to th eonset of bleeding  Has had DVT and PE - may have antiphospholipid syndrome but hematologist said the 'score wasn't hight enough' for formal discussion. No true explanation   Has been on blood thinner for at least 2 years with not changes in dosing. No hot flashes and no vaginal dryness. No skin or hair texture changes. Has been weight loss due to the medication - has lost 53lbs.   Will wait until [place] priro to starting any medicatio - worried about possible issue with micronor/meds      Gynecologic History Patient's last menstrual period was 04/06/2023. Contraception: condoms Last Pap: No results found for: "DIAGPAP", "HPVHIGH", "ADEQPAP" Last Mammogram: none seen on file Last Colonoscopy: n/a  Obstetric History OB History  Gravida Para Term Preterm AB Living  3 2 2  0 1 2  SAB IAB Ectopic Multiple Live Births  1 0 0 0 0    # Outcome Date GA Lbr Len/2nd Weight Sex Type Anes PTL Lv  3 SAB 2006          2 Term 2002 [redacted]w[redacted]d         1 Term 2001 [redacted]w[redacted]d           Past Medical History:  Diagnosis Date  . Acute deep vein thrombosis (DVT) of right lower  extremity (HCC) 01/18/2021  . Acute pulmonary embolism (HCC) 01/19/2021  . Asthma   . Cellulitis in diabetic foot (HCC)   . Diabetes mellitus without complication (HCC)   . DVT (deep venous thrombosis) (HCC) 02/05/2021  . DVT (deep venous thrombosis) (HCC)   . PE (pulmonary thromboembolism) (HCC) 2016    Past Surgical History:  Procedure Laterality Date  . adenoids    . CESAREAN SECTION      Current Outpatient Medications on File Prior to Visit  Medication Sig Dispense Refill  . Accu-Chek Softclix Lancets lancets Use as instructed 100 each 12  . albuterol (VENTOLIN HFA) 108 (90 Base) MCG/ACT inhaler Inhale 2 puffs into the lungs every 6 (six) hours as needed for wheezing or shortness of breath. 8.5 g 0  . apixaban (ELIQUIS) 5 MG TABS tablet Take 1 tablet (5 mg total) by mouth 2 (two) times daily. 60 tablet 5  . blood glucose meter kit and supplies KIT Dispense based on patient and insurance preference. Use up to four times daily as directed. 1 each 0  . Erenumab-aooe (AIMOVIG) 140 MG/ML SOAJ Inject 140 mg into the skin every 28 (twenty-eight) days. 1.12 mL 5  . furosemide (LASIX) 20 MG tablet Take 1 tablet (20 mg total) by mouth daily. 6 tablet  0  . glucose blood (ACCU-CHEK GUIDE) test strip Use as instructed 100 each 12  . metFORMIN (GLUCOPHAGE-XR) 500 MG 24 hr tablet Take 2 tablets (1,000 mg total) by mouth 2 (two) times daily. 360 tablet 3  . Semaglutide, 1 MG/DOSE, (OZEMPIC, 1 MG/DOSE,) 4 MG/3ML SOPN Inject 1 mg as directed once a week. 3 mL 2  . [DISCONTINUED] diphenhydrAMINE (BENADRYL) 25 mg capsule Take 1 capsule (25 mg total) by mouth every 6 (six) hours as needed for itching. 30 capsule 0  . [DISCONTINUED] metoCLOPramide (REGLAN) 10 MG tablet Take 1 tablet (10 mg total) by mouth every 6 (six) hours as needed (Nausea or headache). (Patient not taking: Reported on 10/12/2017) 30 tablet 0  . [DISCONTINUED] promethazine (PHENERGAN) 25 MG tablet Take 1 tablet (25 mg total) by mouth  every 6 (six) hours as needed for nausea or vomiting (headache). (Patient not taking: Reported on 01/18/2021) 12 tablet 0   No current facility-administered medications on file prior to visit.    Allergies  Allergen Reactions  . Amoxapine And Related Other (See Comments)    Burns skin, hair falls out  . Amoxicillin Other (See Comments)    Burns skin and hair falls out  . Oxycodone Itching  . Percocet [Oxycodone-Acetaminophen] Itching  . Morphine Itching and Other (See Comments)    Must receive Benadryl to tolerate  . Rocephin [Ceftriaxone] Swelling, Rash and Other (See Comments)    Swelling at site where received  . Tramadol Itching and Other (See Comments)    Must receive Benadryl to tolerate    Social History:  reports that she has never smoked. She has never used smokeless tobacco. She reports current alcohol use. She reports that she does not use drugs.  Family History  Problem Relation Age of Onset  . Deep vein thrombosis Father   . Breast cancer Sister 37    The following portions of the patient's history were reviewed and updated as appropriate: allergies, current medications, past family history, past medical history, past social history, past surgical history and problem list.  Review of Systems Pertinent items noted in HPI and remainder of comprehensive ROS otherwise negative.  Physical Exam:  Wt 200 lb (90.7 kg)   LMP 04/06/2023 Comment: lastsed 7 days, heavy bleeding  BMI 32.28 kg/m  Physical Exam Vitals and nursing note reviewed. Exam conducted with a chaperone present.  Constitutional:      Appearance: Normal appearance.  Cardiovascular:     Rate and Rhythm: Normal rate.  Pulmonary:     Effort: Pulmonary effort is normal.     Breath sounds: Normal breath sounds.  Genitourinary:    General: Normal vulva.     Exam position: Lithotomy position.     Vagina: Normal.     Comments: Few nabothina cysts Slight friability No CMT and normal sized uterus with  normal contours Neurological:     General: No focal deficit present.     Mental Status: She is alert and oriented to person, place, and time.  Psychiatric:        Mood and Affect: Mood normal.        Behavior: Behavior normal.        Thought Content: Thought content normal.        Judgment: Judgment normal.       Assessment and Plan:   1. Abnormal uterine bleeding (AUB) Patient has abnormal uterine bleeding . ***She has a normal exam, no evidence of lesions.  Will order abnormal uterine bleeding evaluation  labs and pelvic ultrasound to evaluate for any structural gynecologic abnormalities.  Will contact patient with these results and plans for further evaluation/management.    - F/U 12 months and prn  - US PELVIC COMPLETE WITH TRANSVAGINAL; Future - TSH Rfx on Abnormal to Free T4 - Follicle stimulating hormone  2. Screening for cervical cancer Pap collected - Cytology - PAP  3. Routine screening for STI (sexually transmitted infection) - RPR+HBsAg+HCVAb+...  4. Well woman exam with routine gynecological exam - Cervical cancer screening: Discussed guidelines. Pap with HPV collected - STD Testing: accepts - Birth Control: Discussed options and their risks, benefits and common side effects; discussed VTE with estrogen containing options. Desires: Condoms - Breast Health: Encouraged self breast awareness/SBE. Teaching provided. Discussed limits of clinical breast exam for detecting breast cancer. Rx given for MXR - MM 3D SCREENING MAMMOGRAM BILATERAL BREAST; Future    Routine preventative health maintenance measures emphasized. Please refer to After Visit Summary for other counseling recommendations.   Follow-up: No follow-ups on file.      Lorriane [name], MD Obstetrician & Gynecologist, Faculty Practice [facility] [employer], [facility]

## 2023-04-26 ENCOUNTER — Encounter: Payer: Self-pay | Admitting: Obstetrics and Gynecology

## 2023-04-26 LAB — RPR+HBSAG+HCVAB+...
HIV Screen 4th Generation wRfx: NONREACTIVE
Hep C Virus Ab: NONREACTIVE
Hepatitis B Surface Ag: NEGATIVE
RPR Ser Ql: NONREACTIVE

## 2023-04-26 LAB — FOLLICLE STIMULATING HORMONE: FSH: 6.3 m[IU]/mL

## 2023-04-26 LAB — TSH RFX ON ABNORMAL TO FREE T4: TSH: 1.09 u[IU]/mL (ref 0.450–4.500)

## 2023-04-27 ENCOUNTER — Ambulatory Visit (HOSPITAL_COMMUNITY)
Admission: RE | Admit: 2023-04-27 | Discharge: 2023-04-27 | Disposition: A | Discharge status: Home / Self Care | Payer: Medicaid Other | Source: Ambulatory Visit | Attending: Obstetrics and Gynecology

## 2023-04-27 ENCOUNTER — Ambulatory Visit: Payer: Medicaid Other | Admitting: Podiatry

## 2023-04-27 DIAGNOSIS — N939 Abnormal uterine and vaginal bleeding, unspecified: Secondary | ICD-10-CM | POA: Diagnosis present

## 2023-04-30 ENCOUNTER — Telehealth: Payer: Medicaid Other | Admitting: Emergency Medicine

## 2023-04-30 ENCOUNTER — Ambulatory Visit: Payer: Self-pay

## 2023-04-30 DIAGNOSIS — S61012A Laceration without foreign body of left thumb without damage to nail, initial encounter: Secondary | ICD-10-CM

## 2023-04-30 NOTE — Progress Notes (Signed)
E-Visit for Simple Cut/Laceration  We are sorry that you have had an injury. Here is how we plan to help!  Based on what you shared with me it looks like you have a simple laceration that does not need to be repaired with stitches or tissue glue.   Finger tip lacerations are rarely able to be fixed with suture or glue.  Normally applying bacitracin and a bulky bandage is sufficient.    If you significantly damaged the nail or are concerned you might have hit the bone, you should go to an urgent care or ER.  HOME CARE: Clean the cut or scrape - Wash it well with soap and water. * avoid using hydrogen peroxide which may cause tissue damage, or impede wound healing.  Stop the bleeding - If your cut or scrape is bleeding, press a clean cloth or bandage firmly on the area for 20 minutes. You can also help slow the bleeding by holding the cut above the level of your heart.   Put a thin layer of Bacitracin antibiotic ointment on the cut or scrape. (this can be purchased at any local pharmacy- ask your pharmacist if you need assistance)   Cover the cut or scrape with a bandage or gauze. Keep the bandage clean and dry. Change the bandage 1 to 2 times every day until your cut or scrape heals.   Watch for signs that your cut or scrape is infected (redness, drainage, pain, warmth, swelling or fever)  Over the next 48 hours your wound should start to improve with less pain, less swelling and less redness. If you should develop increasing pain, swelling, redness, fever, pus from the wound you should be seen immediately to make sure this is not becoming infected.   WOUND CARE: Please keep a layer of antibiotic ointment (bacitracin preferred) on this wound at least twice a day for the next seven days and keep a sterile dressing over top of it. You may gently clean the wound with warm soap and water between dressing changes.  We strongly recommend that you have a medical provider reevaluate your wound  within 2 to 3 days in person to make sure that it is healing appropriately.  Thank you for choosing an e-visit.  Your e-visit answers were reviewed by a board certified advanced clinical practitioner to complete your personal care plan. Depending upon the condition, your plan could have included both over the counter or prescription medications.  Please review your pharmacy choice. Make sure the pharmacy is open so you can pick up prescription now. If there is a problem, you may contact your provider through [employer] and have the prescription routed to another pharmacy.  Your safety is important to [place]. If you have drug allergies check your prescription carefully.   For the next 24 hours you can use MyChart to ask questions about today's visit, request a non-urgent call back, or ask for a work or school excuse. You will get an email in the next two days asking about your experience. I hope that your e-visit has been valuable and will speed your recovery.  Approximately 5 minutes was used in reviewing the patient's chart, questionnaire, prescribing medications, and documentation.

## 2023-05-02 LAB — CYTOLOGY - PAP
Chlamydia: NEGATIVE
Comment: NEGATIVE
Comment: NEGATIVE
Comment: NEGATIVE
Comment: NORMAL
Diagnosis: NEGATIVE
Diagnosis: REACTIVE
High risk HPV: NEGATIVE
Neisseria Gonorrhea: NEGATIVE
Trichomonas: NEGATIVE

## 2023-05-09 ENCOUNTER — Other Ambulatory Visit: Payer: Self-pay

## 2023-05-10 ENCOUNTER — Ambulatory Visit (INDEPENDENT_AMBULATORY_CARE_PROVIDER_SITE_OTHER): Payer: Medicaid Other | Admitting: Podiatry

## 2023-05-10 ENCOUNTER — Encounter: Payer: Self-pay | Admitting: Podiatry

## 2023-05-10 DIAGNOSIS — E1142 Type 2 diabetes mellitus with diabetic polyneuropathy: Secondary | ICD-10-CM | POA: Diagnosis not present

## 2023-05-10 DIAGNOSIS — L0889 Other specified local infections of the skin and subcutaneous tissue: Secondary | ICD-10-CM | POA: Diagnosis not present

## 2023-05-10 NOTE — Progress Notes (Addendum)
  Subjective:  Patient ID: Shannon Oneal, female    DOB: August 15, 1980,   MRN: [id number]  No chief complaint on file.   42 y.o. female presents for concern of an ulceration on her left foot that started about three weeks ago. Relates she tried to file down a callus and noticed an opening. She was seen in urgent care and placed on doxycyline and advised to follow-up. She is on a blood thinner.  Relates burning and tingling in their feet. Patient is diabetic and last A1c was  Lab Results  Component Value Date   HGBA1C 5.6 03/02/2023   .   PCP:  Ivonne Andrew, NP    . Denies any other pedal complaints. Denies n/v/f/c.   Past Medical History:  Diagnosis Date   Acute deep vein thrombosis (DVT) of right lower extremity (HCC) 01/18/2021   Acute pulmonary embolism (HCC) 01/19/2021   Asthma    Cellulitis in diabetic foot (HCC)    Diabetes mellitus without complication (HCC)    DVT (deep venous thrombosis) (HCC) 02/05/2021   DVT (deep venous thrombosis) (HCC)    PE (pulmonary thromboembolism) (HCC) 2016    Objective:  Physical Exam: Vascular: DP/PT pulses 2/4 bilateral. CFT <3 seconds. Normal hair growth on digits. No edema.  Skin. No lacerations or abrasions bilateral feet. Plantar left first metatarsal ulceration healed.  Musculoskeletal: MMT 5/5 bilateral lower extremities in DF, PF, Inversion and Eversion. Deceased ROM in DF of ankle joint.  Neurological: Sensation intact to light touch.   Assessment:   1. Diabetic polyneuropathy associated with type 2 diabetes mellitus (HCC)   2. Infected sinus of skin       Plan:  Patient was evaluated and treated and all questions answered. Ulcer left plantar first metatarsal -healed.  -X-rays reviewed and no signs of osseous erosion.  -Debridement of hyeprkeratotic tissue without incident as courtesy  -Dressed with betadine, DSD. -No shoe required at this point.  -No abx indicated.  -Discussed glucose control and proper protein-rich  diet.  -Discussed if any worsening redness, pain, fever or chills to call or may need to report to the emergency room. Patient expressed understanding.   Return in 4 weeks for recheck of wound.   Return in about 4 weeks (around 06/07/2023) for wound check.   Louann Sjogren, DPM

## 2023-05-11 NOTE — Addendum Note (Signed)
Addended by: Louann Sjogren R on: 05/11/2023 08:20 AM   Modules accepted: Level of Service

## 2023-05-14 ENCOUNTER — Other Ambulatory Visit: Payer: Self-pay | Admitting: Nurse Practitioner

## 2023-05-14 ENCOUNTER — Other Ambulatory Visit: Payer: Self-pay

## 2023-05-14 MED ORDER — OZEMPIC (1 MG/DOSE) 4 MG/3ML ~~LOC~~ SOPN
1.0000 mg | PEN_INJECTOR | SUBCUTANEOUS | 2 refills | Status: DC
Start: 2023-05-14 — End: 2023-08-09
  Filled 2023-05-14: qty 9, 84d supply, fill #0

## 2023-05-15 ENCOUNTER — Other Ambulatory Visit: Payer: Self-pay

## 2023-05-23 ENCOUNTER — Other Ambulatory Visit: Payer: Self-pay

## 2023-05-26 ENCOUNTER — Encounter (HOSPITAL_COMMUNITY): Payer: Self-pay

## 2023-05-28 ENCOUNTER — Other Ambulatory Visit (HOSPITAL_COMMUNITY): Payer: Self-pay

## 2023-05-29 ENCOUNTER — Other Ambulatory Visit: Payer: Self-pay | Admitting: Nurse Practitioner

## 2023-05-29 DIAGNOSIS — Z1231 Encounter for screening mammogram for malignant neoplasm of breast: Secondary | ICD-10-CM

## 2023-06-06 DIAGNOSIS — Z1231 Encounter for screening mammogram for malignant neoplasm of breast: Secondary | ICD-10-CM

## 2023-06-07 ENCOUNTER — Ambulatory Visit: Payer: Medicaid Other | Admitting: Podiatry

## 2023-06-07 DIAGNOSIS — Z91199 Patient's noncompliance with other medical treatment and regimen due to unspecified reason: Secondary | ICD-10-CM

## 2023-06-07 NOTE — Progress Notes (Signed)
No show

## 2023-06-28 ENCOUNTER — Other Ambulatory Visit (HOSPITAL_COMMUNITY)
Admission: RE | Admit: 2023-06-28 | Discharge: 2023-06-28 | Disposition: A | Discharge status: Home / Self Care | Payer: Medicaid Other | Source: Ambulatory Visit | Attending: Obstetrics and Gynecology | Admitting: Obstetrics and Gynecology

## 2023-06-28 ENCOUNTER — Other Ambulatory Visit: Payer: Self-pay

## 2023-06-28 ENCOUNTER — Encounter: Payer: Self-pay | Admitting: Obstetrics and Gynecology

## 2023-06-28 ENCOUNTER — Ambulatory Visit: Payer: Medicaid Other | Admitting: Obstetrics and Gynecology

## 2023-06-28 VITALS — BP 119/83 | HR 91 | Wt 211.5 lb

## 2023-06-28 DIAGNOSIS — N939 Abnormal uterine and vaginal bleeding, unspecified: Secondary | ICD-10-CM | POA: Insufficient documentation

## 2023-06-28 NOTE — Progress Notes (Signed)
GYNECOLOGY VISIT  Patient name: Shannon Oneal MRN [id number]  Date of birth: 20-Nov-1980 Chief Complaint:   Procedure  History:  Shannon Oneal is a 42 y.o. [id number] being seen today for AUB follow up. Menses remain irregular. Last menses about 6 days and not prolonged like during prior episodes. Does not want to add medications to her medication list given possible risks and already on many meds.     Past Medical History:  Diagnosis Date   Acute deep vein thrombosis (DVT) of right lower extremity (HCC) 01/18/2021   Acute pulmonary embolism (HCC) 01/19/2021   Asthma    Cellulitis in diabetic foot (HCC)    Diabetes mellitus without complication (HCC)    DVT (deep venous thrombosis) (HCC) 02/05/2021   DVT (deep venous thrombosis) (HCC)    PE (pulmonary thromboembolism) (HCC) 2016    Past Surgical History:  Procedure Laterality Date   adenoids     CESAREAN SECTION      The following portions of the patient's history were reviewed and updated as appropriate: allergies, current medications, past family history, past medical history, past social history, past surgical history and problem list.   Health Maintenance:   Last pap     Component Value Date/Time   DIAGPAP  04/25/2023 1035    - Negative for Intraepithelial Lesions or Malignancy (NILM)   DIAGPAP - Benign reactive/reparative changes 04/25/2023 1035   HPVHIGH Negative 04/25/2023 1035   ADEQPAP  04/25/2023 1035    Satisfactory for evaluation; transformation zone component PRESENT.    High Risk HPV: Positive  Adequacy:  Satisfactory for evaluation, transformation zone component PRESENT  Diagnosis:  Atypical squamous cells of undetermined significance (ASC-US)  Last mammogram: 05/2021 BIRADS 1   Review of Systems:  Pertinent items are noted in HPI. Comprehensive review of systems was otherwise negative.   Objective:  Physical Exam BP 119/83   Pulse 91   Wt 211 lb 8 oz (95.9 kg)   LMP 06/06/2023 (Approximate)    BMI 34.14 kg/m    Physical Exam Vitals and nursing note reviewed. Exam conducted with a chaperone present.  Constitutional:      Appearance: Normal appearance.  HENT:     Head: Normocephalic and atraumatic.  Pulmonary:     Effort: Pulmonary effort is normal.     Breath sounds: Normal breath sounds.  Genitourinary:    General: Normal vulva.     Exam position: Lithotomy position.     Vagina: Normal.     Cervix: Normal.  Skin:    General: Skin is warm and dry.  Neurological:     General: No focal deficit present.     Mental Status: She is alert.  Psychiatric:        Mood and Affect: Mood normal.        Behavior: Behavior normal.        Thought Content: Thought content normal.        Judgment: Judgment normal.      Labs and Imaging US PELVIC COMPLETE WITH TRANSVAGINAL CLINICAL DATA:  Abnormal uterine bleeding  EXAM: TRANSABDOMINAL AND TRANSVAGINAL ULTRASOUND OF PELVIS  TECHNIQUE: Both transabdominal and transvaginal ultrasound examinations of the pelvis were performed. Transabdominal technique was performed for global imaging of the pelvis including uterus, ovaries, adnexal regions, and pelvic cul-de-sac. It was necessary to proceed with endovaginal exam following the transabdominal exam to visualize the ovaries.  COMPARISON:  None Available.  FINDINGS: Uterus  Measurements: 9.0 x 4.5 x 6.8 cm. = volume: 144  mL. No fibroids or other mass visualized.  Endometrium  Thickness: 8 mm.  No focal abnormality visualized.  Right ovary  Measurements: 3.4 x 1.2 x 1.6 cm. = volume: 3.3 mL. Normal appearance/no adnexal mass.  Left ovary  Measurements: 3.9 x 2.1 x 2.5 cm. = volume: 10.9 mL. Normal appearance/no adnexal mass.  Other findings  No abnormal free fluid.  IMPRESSION: Unremarkable pelvic ultrasound.  Electronically Signed   By: Alcide Clever M.D.   On: 05/14/2023 17:13  Endometrial Biopsy Procedure  Patient identified, informed consent performed,   indication reviewed, consent signed.  Reviewed risk of perforation, pain, bleeding, insufficient sample, etc were reviewd. Time out was performed.  Urine pregnancy test negative.  Speculum placed in the vagina.  Cervix visualized.  Cleaned with Betadine x 2.  Anterior cervix grasped anteriorly with a single tooth tenaculum.  Paracervical block was administered.  Endometrial pipelle was used to draw up 1cc of 1% lidocaine, introduced into the cervical os and instilled into the endometrial cavity.  The pipelle was passed twice without difficulty and sample obtained. Tenaculum was removed, good hemostasis noted.  Patient tolerated procedure well.  Patient was given post-procedure instructions.      Assessment & Plan:   1. Abnormal uterine bleeding (AUB) Bleeding not has heavy more recently. Now s/p uncomplicated EMB. Given information about endometrial ablation and hysterectomy. If bleeding worsens, will discuss surgical options further. Estrogen containing products and TXA both contraindicated given VTE hx. Will continue to monitor bleeding for now.   - Surgical pathology   Routine preventative health maintenance measures emphasized.  Lorriane [name], MD [facility], [facility]

## 2023-07-02 LAB — SURGICAL PATHOLOGY

## 2023-07-16 NOTE — Progress Notes (Deleted)
NEUROLOGY FOLLOW UP OFFICE NOTE  Shannon Oneal [id number]  Assessment/Plan:   Migraine without aura, without status migrainosus, intractable    Migraine prevention:  Aimovig 140mg  Migraine rescue:  She will try samples of Ubrelvy 100mg .  She will take Zofran ODT 4mg  for nausea Keep headache diary Follow up 6 months.       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  UPDATE: Started Aimovig.  Not effective.  Intensity:  severe Duration:  *** with Bernita Raisin *** Frequency:  2 times a month (lasting 1 to 2 days each) ***  Current NSAIDS/analgesics:  none Current triptans:  none Current ergotamine:  none Current anti-emetic:  Zofran ODT 4mg  Current muscle relaxants:  none Current Antihypertensive medications:  none Current Antidepressant medications:  none Current Anticonvulsant medications:  none Current anti-CGRP:  Aimovig 140mg  every 28 days, Ubrelvy 100mg  (samples) Other therapy:  none Birth control:  none Other medications:  Eliquis, metformin  HISTORY:  Migraines: Onset:  42 years old.  Describes severe right occipital stabbing or pounding pain.  Associated with Nausea, photophobia, phonophobia, blurred vision.  Usually lasts 3 days.  They usually occur 2 times a month (total 6 headache days a month).  Triggers:  Unknown.  Relieving factors:  rest in dark room listening to relaxing white noise.   She was seen in the ED on 08/09/2022 where CT Head personally reviewed was negative.     Diabetic Polyneuropathy: Pain in legs and feet since 2022.  Initially with DVT in left leg but pain never improved.  Burning stabbing pain in the feet and legs.  Feels like "ants are crawling up and down my legs".  She has diabetes which has been uncontrolled for a while.  Last Hgb A1c from November was 7.6.  Previously  used pregablin, gabapentin, duloxetine, and amitriptyline.  As she has already tried and failed the medications I typically prescribe for neuralgia, recommended referral to pain management, which she declined.     Past NSAIDS/analgesics:  meloxicam, lidocaine patch, morphine (itching), oxycodone (itching), naproxen, Excedrin Past abortive triptans:  sumatriptan Past abortive ergotamine:  none Past muscle relaxants:  Flexeril Past anti-emetic:  Reglan, Phenergan Past antihypertensive medications:  furosemide Past antidepressant medications:  Amitriptyline (drowsiness), duloxetine Past anticonvulsant medications:  gabapentin Past CGRP inhibitor:  Nurtec PRN Other past therapies:  none     PAST MEDICAL HISTORY: Past Medical History:  Diagnosis Date   Acute deep vein thrombosis (DVT) of right lower extremity (HCC) 01/18/2021   Acute pulmonary embolism (HCC) 01/19/2021   Asthma    Cellulitis in diabetic foot (HCC)    Diabetes mellitus without complication (HCC)    DVT (deep venous thrombosis) (HCC) 02/05/2021   DVT (deep venous thrombosis) (HCC)    PE (pulmonary thromboembolism) (HCC) 2016    MEDICATIONS: Current Outpatient Medications on File Prior to Visit  Medication Sig Dispense Refill   Accu-Chek Softclix Lancets lancets Use as instructed 100 each 12   albuterol (VENTOLIN HFA) 108 (90 Base) MCG/ACT inhaler Inhale 2 puffs into the lungs every 6 (six) hours as needed for wheezing or shortness of breath. 8.5 g 0   apixaban (ELIQUIS) 5 MG TABS tablet Take 1 tablet (5 mg total) by mouth 2 (two) times daily. 60 tablet 5   blood glucose meter kit and supplies KIT Dispense based on patient and  insurance preference. Use up to four times daily as directed. 1 each 0   Erenumab-aooe (AIMOVIG) 140 MG/ML SOAJ Inject 140 mg into the skin every 28 (twenty-eight) days. 1.12 mL 5   furosemide (LASIX) 20 MG tablet Take 1 tablet (20 mg total) by mouth daily. 6 tablet 0   glucose blood (ACCU-CHEK  GUIDE) test strip Use as instructed 100 each 12   metFORMIN (GLUCOPHAGE-XR) 500 MG 24 hr tablet Take 2 tablets (1,000 mg total) by mouth 2 (two) times daily. 360 tablet 3   Semaglutide, 1 MG/DOSE, (OZEMPIC, 1 MG/DOSE,) 4 MG/3ML SOPN Inject 1 mg as directed once a week. 3 mL 2   [DISCONTINUED] diphenhydrAMINE (BENADRYL) 25 mg capsule Take 1 capsule (25 mg total) by mouth every 6 (six) hours as needed for itching. 30 capsule 0   [DISCONTINUED] metoCLOPramide (REGLAN) 10 MG tablet Take 1 tablet (10 mg total) by mouth every 6 (six) hours as needed (Nausea or headache). (Patient not taking: Reported on 10/12/2017) 30 tablet 0   [DISCONTINUED] promethazine (PHENERGAN) 25 MG tablet Take 1 tablet (25 mg total) by mouth every 6 (six) hours as needed for nausea or vomiting (headache). (Patient not taking: Reported on 01/18/2021) 12 tablet 0   No current facility-administered medications on file prior to visit.    ALLERGIES: Allergies  Allergen Reactions   Amoxapine And Related Other (See Comments)    Burns skin, hair falls out   Amoxicillin Other (See Comments)    Burns skin and hair falls out   Oxycodone Itching   Percocet [Oxycodone-Acetaminophen] Itching   Morphine Itching and Other (See Comments)    Must receive Benadryl to tolerate   Rocephin [Ceftriaxone] Swelling, Rash and Other (See Comments)    Swelling at site where received   Tramadol Itching and Other (See Comments)    Must receive Benadryl to tolerate    FAMILY HISTORY: Family History  Problem Relation Age of Onset   Deep vein thrombosis Father    Breast cancer Sister 39      Objective:  *** General: No acute distress.  Patient appears well-groomed.   Head:  Normocephalic/atraumatic Neck:  Supple.  No paraspinal tenderness.  Full range of motion. Heart:  Regular rate and rhythm. Neuro:  Alert and oriented.  Speech fluent and not dysarthric.  Language intact.  CN II-XII intact.  Bulk and tone normal.  Muscle strength 5/5  throughout.  Deep tendon reflexes 2+ throughout.  Gait normal.  Romberg negative.     Shon Millet, DO  CC: Angus Seller, NP

## 2023-07-17 ENCOUNTER — Ambulatory Visit: Payer: Medicaid Other | Admitting: Neurology

## 2023-08-03 ENCOUNTER — Encounter: Payer: Self-pay | Admitting: Nurse Practitioner

## 2023-08-03 ENCOUNTER — Telehealth (INDEPENDENT_AMBULATORY_CARE_PROVIDER_SITE_OTHER): Payer: Commercial Managed Care - PPO | Admitting: Nurse Practitioner

## 2023-08-03 DIAGNOSIS — M7502 Adhesive capsulitis of left shoulder: Secondary | ICD-10-CM

## 2023-08-03 MED ORDER — PREDNISONE 20 MG PO TABS
20.0000 mg | ORAL_TABLET | Freq: Every day | ORAL | 0 refills | Status: DC
Start: 2023-08-03 — End: 2023-12-25

## 2023-08-03 MED ORDER — TIZANIDINE HCL 4 MG PO TABS
4.0000 mg | ORAL_TABLET | Freq: Four times a day (QID) | ORAL | 0 refills | Status: AC | PRN
Start: 2023-08-03 — End: ?

## 2023-08-03 NOTE — Progress Notes (Signed)
 Virtual Visit via Video Note  I connected with Shannon Oneal on 08/03/23 at  1:20 PM EST by a video enabled telemedicine application and verified that I am speaking with the correct person using two identifiers.  Location: Patient: home Provider: remote   I discussed the limitations of evaluation and management by telemedicine and the availability of in person appointments. The patient expressed understanding and agreed to proceed.  History of Present Illness: Patient presents today for video visit for an acute visit.  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 She states this did help but over the past 2 weeks her left shoulder has become progressively worse again. She can not raise her left arm. Will place referral back to ortho and trial prednisone  and muscle relaxer. Denies f/c/s, n/v/d, hemoptysis, PND, leg swelling Denies chest pain or edema      Observations/Objective:     06/28/2023    2:08 PM 04/25/2023   10:02 AM 04/10/2023    5:46 PM  Vitals with BMI  Weight 211 lbs 8 oz 200 lbs   Systolic 119 110 881  Diastolic 83 85 81  Pulse 91 80 95      Assessment and Plan:  1. Adhesive bursitis of left shoulder (Primary)  - AMB referral to orthopedics - tiZANidine  (ZANAFLEX ) 4 MG tablet; Take 1 tablet (4 mg total) by mouth every 6 (six) hours as needed for muscle spasms.  Dispense: 30 tablet; Refill: 0 - predniSONE  (DELTASONE ) 20 MG tablet; Take 1 tablet (20 mg total) by mouth daily with breakfast.  Dispense: 5 tablet; Refill: 0  Follow up in 3 months    I discussed the assessment and treatment plan with the patient. The patient was provided an opportunity to ask questions and all were answered. The patient agreed with the plan and demonstrated an understanding of the instructions.   The patient was advised to call back or seek an in-person evaluation if the symptoms worsen or if the condition fails to improve as anticipated.  I  provided 23 minutes of non-face-to-face time during this encounter.   Bascom GORMAN Borer, NP

## 2023-08-09 ENCOUNTER — Other Ambulatory Visit: Payer: Self-pay | Admitting: Nurse Practitioner

## 2023-08-10 ENCOUNTER — Other Ambulatory Visit: Payer: Self-pay

## 2023-08-10 ENCOUNTER — Telehealth: Payer: Self-pay | Admitting: Nurse Practitioner

## 2023-08-10 MED ORDER — OZEMPIC (1 MG/DOSE) 4 MG/3ML ~~LOC~~ SOPN
1.0000 mg | PEN_INJECTOR | SUBCUTANEOUS | 0 refills | Status: DC
Start: 2023-08-10 — End: 2023-08-13
  Filled 2023-08-10 – 2023-08-13 (×2): qty 3, 28d supply, fill #0

## 2023-08-10 NOTE — Telephone Encounter (Signed)
Caller & Relationship to patient:  self MRN #  [id number]   Call Back Number:   Date of Last Office Visit: 04/06/2023     Date of Next Office Visit: Visit date not found    Medication(s) to be Refilled: Ozempic  Preferred Pharmacy:   ** Please notify patient to allow 48-72 hours to process** **Let patient know to contact pharmacy at the end of the day to make sure medication is ready. ** **If patient has not been seen in a year or longer, book an appointment **Advise to use MyChart for refill requests OR to contact their pharmacy

## 2023-08-12 ENCOUNTER — Telehealth: Payer: Commercial Managed Care - PPO | Admitting: Physician Assistant

## 2023-08-12 DIAGNOSIS — M25512 Pain in left shoulder: Secondary | ICD-10-CM

## 2023-08-12 NOTE — Patient Instructions (Signed)
[name], thank you for joining Roney Jaffe, PA-C for today's virtual visit.  While this provider is not your primary care provider (PCP), if your PCP is located in our provider database this encounter information will be shared with them immediately following your visit.   A [facility] MyChart account gives you access to today's visit and all your visits, tests, and labs performed at [facility] " click here if you don't have a [facility] MyChart account or go to [email or web address]  Consent: (Patient) Shannon Oneal provided verbal consent for this virtual visit at the beginning of the encounter.  Current Medications:  Current Outpatient Medications:    Accu-Chek Softclix Lancets lancets, Use as instructed, Disp: 100 each, Rfl: 12   albuterol (VENTOLIN HFA) 108 (90 Base) MCG/ACT inhaler, Inhale 2 puffs into the lungs every 6 (six) hours as needed for wheezing or shortness of breath., Disp: 8.5 g, Rfl: 0   apixaban (ELIQUIS) 5 MG TABS tablet, Take 1 tablet (5 mg total) by mouth 2 (two) times daily., Disp: 60 tablet, Rfl: 5   blood glucose meter kit and supplies KIT, Dispense based on patient and insurance preference. Use up to four times daily as directed., Disp: 1 each, Rfl: 0   Erenumab-aooe (AIMOVIG) 140 MG/ML SOAJ, Inject 140 mg into the skin every 28 (twenty-eight) days., Disp: 1.12 mL, Rfl: 5   furosemide (LASIX) 20 MG tablet, Take 1 tablet (20 mg total) by mouth daily., Disp: 6 tablet, Rfl: 0   glucose blood (ACCU-CHEK GUIDE) test strip, Use as instructed, Disp: 100 each, Rfl: 12   metFORMIN (GLUCOPHAGE-XR) 500 MG 24 hr tablet, Take 2 tablets (1,000 mg total) by mouth 2 (two) times daily., Disp: 360 tablet, Rfl: 3   predniSONE (DELTASONE) 20 MG tablet, Take 1 tablet (20 mg total) by mouth daily with breakfast., Disp: 5 tablet, Rfl: 0   Semaglutide, 1 MG/DOSE, (OZEMPIC, 1 MG/DOSE,) 4 MG/3ML SOPN, Inject 1 mg as directed once a week., Disp: 3 mL, Rfl: 0   tiZANidine  (ZANAFLEX) 4 MG tablet, Take 1 tablet (4 mg total) by mouth every 6 (six) hours as needed for muscle spasms., Disp: 30 tablet, Rfl: 0   Medications ordered in this encounter:  No orders of the defined types were placed in this encounter.    *If you need refills on other medications prior to your next appointment, please contact your pharmacy*  Follow-Up: Call back or seek an in-person evaluation if the symptoms worsen or if the condition fails to improve as anticipated.  [facility] [phone]  Other Instructions Shoulder Pain Many things can cause shoulder pain, including: An injury to the shoulder. Overuse of the shoulder. Arthritis. The source of the pain can be: Inflammation. An injury to the shoulder joint. An injury to a tendon, ligament, or bone. Follow these instructions at home: Pay attention to changes in your symptoms. Let your health care provider know about them. Follow these instructions to relieve your pain. If you have a removable sling: Wear the sling as told by your provider. Remove it only as told by your provider. Check the skin around the sling every day. Tell your provider about any concerns. Loosen the sling if your fingers tingle, become numb, or become cold. Keep the sling clean. If the sling is not waterproof: Do not let it get wet. Remove it to shower or bathe. Move your arm as little as possible, but keep your hand moving to prevent swelling. Managing pain,  stiffness, and swelling  If told, put ice on the painful area. If you have a removable sling or immobilizer, remove it as told by your provider. Put ice in a plastic bag. Place a towel between your skin and the bag. Leave the ice on for 20 minutes, 2-3 times a day. If your skin turns bright red, remove the ice right away to prevent skin damage. The risk of damage is higher if you cannot feel pain, heat, or cold. Move your fingers often to reduce stiffness and swelling. Squeeze  a soft ball or a foam pad as much as possible. This helps to keep the shoulder from swelling. It also helps to strengthen the arm. General instructions Take over-the-counter and prescription medicines only as told by your provider. Exercise may help with pain management. Perform exercises if told by your provider. You may be referred to a physical therapist to help in your recovery process. Keep all follow-up visits in order to avoid any type of permanent shoulder disability or chronic pain problems. Contact a health care provider if: Your pain is not relieved with medicines. New pain develops in your arm, hand, or fingers. You loosen your sling and your arm, hand, or fingers remain tingly, numb, swollen, or painful. Get help right away if: Your arm, hand, or fingers turn white or blue. This information is not intended to replace advice given to you by your health care provider. Make sure you discuss any questions you have with your health care provider. Document Revised: 02/10/2022 Document Reviewed: 02/10/2022 Elsevier Patient Education  2024 Elsevier Inc.   If you have been instructed to have an in-person evaluation today at a local Urgent Care facility, please use the link below. It will take you to a list of all of our available [facility], including address, phone number and hours of operation. Please do not delay care.  [facility]  If you or a family member do not have a primary care provider, use the link below to schedule a visit and establish care. When you choose a [facility] primary care physician or advanced practice provider, you gain a long-term partner in health. Find a Primary Care Provider  Learn more about [facility] in-office and virtual care options: [facility] - Get Care Now

## 2023-08-12 NOTE — Progress Notes (Signed)
Virtual Visit Consent   Shannon Oneal, you are scheduled for a virtual visit with a [facility] provider today. Just as with appointments in the office, your consent must be obtained to participate. Your consent will be active for this visit and any virtual visit you may have with one of our providers in the next 365 days. If you have a MyChart account, a copy of this consent can be sent to you electronically.  As this is a virtual visit, video technology does not allow for your provider to perform a traditional examination. This may limit your provider's ability to fully assess your condition. If your provider identifies any concerns that need to be evaluated in person or the need to arrange testing (such as labs, EKG, etc.), we will make arrangements to do so. Although advances in technology are sophisticated, we cannot ensure that it will always work on either your end or our end. If the connection with a video visit is poor, the visit may have to be switched to a telephone visit. With either a video or telephone visit, we are not always able to ensure that we have a secure connection.  By engaging in this virtual visit, you consent to the provision of healthcare and authorize for your insurance to be billed (if applicable) for the services provided during this visit. Depending on your insurance coverage, you may receive a charge related to this service.  I need to obtain your verbal consent now. Are you willing to proceed with your visit today? Shannon Oneal has provided verbal consent on 08/12/2023 for a virtual visit (video or telephone). Shannon Jaffe, PA-C  Date: 08/12/2023 6:48 PM  Virtual Visit via Video Note   I, Shannon Oneal, connected with  Shannon Oneal  ([id number], Sep 20, 1980) on 08/12/23 at  5:15 PM EST by a video-enabled telemedicine application and verified that I am speaking with the correct person using two identifiers.  Location: Patient: Virtual Visit Location Patient:  Home Provider: Virtual Visit Location Provider: Home Office   I discussed the limitations of evaluation and management by telemedicine and the availability of in person appointments. The patient expressed understanding and agreed to proceed.    History of Present Illness: Shannon Oneal is a 43 y.o. who identifies as a female who was assigned female at birth, presents with persistent left shoulder pain that has been troubling for about a month. The pain restricts the patient's ability to raise the left arm past the shoulder, reach above the head, or reach behind. The patient reports no swelling or popping in the shoulder, but describes the pain as constant.  The patient's shoulder issues began the previous summer, initially affecting the right shoulder. At that time, the patient was referred to an orthopedist who administered a corticosteroid injection in the right shoulder, which resolved the issue. The orthopedist had warned that the other arm could potentially develop similar symptoms and advised returning for another injection if this occurred.  Recently, the patient's primary care provider prescribed prednisone and muscle relaxers to manage the left shoulder pain. However, these medications have not provided relief, only causing the patient to sleep. The patient's preferred sleeping position is on the left side, but the shoulder pain has made this position uncomfortable.  Primary care has referred her to orthopedics, the patient has not yet been able to schedule an appointment with the orthopedist.   Problems:  Patient Active Problem List   Diagnosis Date Noted   Abnormal uterine bleeding 03/02/2023  Right calf pain 01/17/2023   Chronic pain of lower extremity, bilateral 01/17/2023   Current smoker 01/17/2023   Acute pain of left shoulder 12/28/2022   Acute pain of right shoulder 12/28/2022   Routine adult health maintenance 03/01/2022   Drug-induced constipation 05/24/2021   Diabetic  neuropathy (HCC) 05/24/2021   Acute DVT (deep venous thrombosis) (HCC) 05/10/2021   Bilateral pulmonary embolism (HCC) 02/04/2021   Deep vein thrombosis (DVT) of right lower extremity (HCC) 02/04/2021   Right leg pain 02/04/2021   Type 2 diabetes mellitus with hyperglycemia (HCC) 01/18/2021    Allergies:  Allergies  Allergen Reactions   Amoxapine And Related Other (See Comments)    Burns skin, hair falls out   Amoxicillin Other (See Comments)    Burns skin and hair falls out   Oxycodone Itching   Percocet [Oxycodone-Acetaminophen] Itching   Morphine Itching and Other (See Comments)    Must receive Benadryl to tolerate   Rocephin [Ceftriaxone] Swelling, Rash and Other (See Comments)    Swelling at site where received   Tramadol Itching and Other (See Comments)    Must receive Benadryl to tolerate   Medications:  Current Outpatient Medications:    Accu-Chek Softclix Lancets lancets, Use as instructed, Disp: 100 each, Rfl: 12   albuterol (VENTOLIN HFA) 108 (90 Base) MCG/ACT inhaler, Inhale 2 puffs into the lungs every 6 (six) hours as needed for wheezing or shortness of breath., Disp: 8.5 g, Rfl: 0   apixaban (ELIQUIS) 5 MG TABS tablet, Take 1 tablet (5 mg total) by mouth 2 (two) times daily., Disp: 60 tablet, Rfl: 5   blood glucose meter kit and supplies KIT, Dispense based on patient and insurance preference. Use up to four times daily as directed., Disp: 1 each, Rfl: 0   Erenumab-aooe (AIMOVIG) 140 MG/ML SOAJ, Inject 140 mg into the skin every 28 (twenty-eight) days., Disp: 1.12 mL, Rfl: 5   furosemide (LASIX) 20 MG tablet, Take 1 tablet (20 mg total) by mouth daily., Disp: 6 tablet, Rfl: 0   glucose blood (ACCU-CHEK GUIDE) test strip, Use as instructed, Disp: 100 each, Rfl: 12   metFORMIN (GLUCOPHAGE-XR) 500 MG 24 hr tablet, Take 2 tablets (1,000 mg total) by mouth 2 (two) times daily., Disp: 360 tablet, Rfl: 3   predniSONE (DELTASONE) 20 MG tablet, Take 1 tablet (20 mg total) by  mouth daily with breakfast., Disp: 5 tablet, Rfl: 0   Semaglutide, 1 MG/DOSE, (OZEMPIC, 1 MG/DOSE,) 4 MG/3ML SOPN, Inject 1 mg as directed once a week., Disp: 3 mL, Rfl: 0   tiZANidine (ZANAFLEX) 4 MG tablet, Take 1 tablet (4 mg total) by mouth every 6 (six) hours as needed for muscle spasms., Disp: 30 tablet, Rfl: 0  Observations/Objective: Patient is well-developed, well-nourished in no acute distress.  Resting comfortably  at home.  Head is normocephalic, atraumatic.  No labored breathing.  Speech is clear and coherent with logical content.  Patient is alert and oriented at baseline.    Assessment and Plan: 1. Acute pain of left shoulder (Primary)  Patient encouraged to continue regimen as prescribed by primary care provider, follow-up with orthopedics.  Red flags given for prompt reevaluation.  Follow Up Instructions: I discussed the assessment and treatment plan with the patient. The patient was provided an opportunity to ask questions and all were answered. The patient agreed with the plan and demonstrated an understanding of the instructions.  A copy of instructions were sent to the patient via MyChart unless otherwise noted below.  The patient was advised to call back or seek an in-person evaluation if the symptoms worsen or if the condition fails to improve as anticipated.    Kasandra Knudsen Mayers, PA-C

## 2023-08-13 ENCOUNTER — Other Ambulatory Visit: Payer: Self-pay | Admitting: Nurse Practitioner

## 2023-08-13 ENCOUNTER — Other Ambulatory Visit: Payer: Self-pay

## 2023-08-13 MED ORDER — OZEMPIC (1 MG/DOSE) 4 MG/3ML ~~LOC~~ SOPN: 1.0000 mg | PEN_INJECTOR | SUBCUTANEOUS | 0 refills | Status: DC

## 2023-08-15 ENCOUNTER — Other Ambulatory Visit (HOSPITAL_COMMUNITY): Payer: Self-pay

## 2023-08-20 ENCOUNTER — Telehealth: Payer: Self-pay

## 2023-08-20 NOTE — Telephone Encounter (Signed)
Pharmacy Patient Advocate Encounter  Received notification from [facility] Medicaid that Prior Authorization for [facility] has been APPROVED from 08/06/2023 to 08/19/2024   PA #/Case ID/Reference #: [id number]

## 2023-08-24 ENCOUNTER — Ambulatory Visit (HOSPITAL_BASED_OUTPATIENT_CLINIC_OR_DEPARTMENT_OTHER): Payer: Commercial Managed Care - PPO

## 2023-08-24 ENCOUNTER — Ambulatory Visit (HOSPITAL_BASED_OUTPATIENT_CLINIC_OR_DEPARTMENT_OTHER): Payer: Commercial Managed Care - PPO | Admitting: Orthopaedic Surgery

## 2023-08-24 DIAGNOSIS — M7502 Adhesive capsulitis of left shoulder: Secondary | ICD-10-CM

## 2023-08-24 DIAGNOSIS — M25512 Pain in left shoulder: Secondary | ICD-10-CM | POA: Diagnosis not present

## 2023-08-24 MED ORDER — LIDOCAINE HCL 1 % IJ SOLN
4.0000 mL | INTRAMUSCULAR | Status: AC | PRN
Start: 2023-08-24 — End: 2023-08-24
  Administered 2023-08-24: 4 mL

## 2023-08-24 MED ORDER — TRIAMCINOLONE ACETONIDE 40 MG/ML IJ SUSP
80.0000 mg | INTRAMUSCULAR | Status: AC | PRN
Start: 2023-08-24 — End: 2023-08-24
  Administered 2023-08-24: 80 mg via INTRA_ARTICULAR

## 2023-08-24 NOTE — Progress Notes (Signed)
Chief Complaint: Left shoulder pain        History of Present Illness:   08/24/2023: Presents today for follow-up of the left shoulder predominantly.  Overall right shoulder does feel much better but the left shoulder is still having some persistent pain in the setting of frozen shoulder    01/12/23: Patient presents today for follow-up evaluation of left adhesive capsulitis.  We did perform a cortisone injection of the left glenohumeral joint 2 weeks ago at the last visit.  She does note significant improvement with overall less pain and improved range of motion.  States that she feels 60% better at this point.  Does note continued soreness of the shoulder in the morning however this does improve later in the day.  Not currently taking any pain medications.    Shannon Oneal is a 43 y.o. female presents today for evaluation of left shoulder pain after she was referred by her PCP yesterday.  Her pain started a few weeks ago without any known injury or cause.  She has moderate amounts of pain located in the shoulder joint as well as the upper back between the neck and shoulder.  Her range of motion has been very limited and inhibits her from performing normal daily activities.  She has had trouble sleeping at night.  She has tried lidocaine patches and received a Toradol injection yesterday that she hasn't felt improvement from yet.  Denies and numbness or tingling in the shoulder or down the arm.  She is a type 2 diabetic and is taking Ozempic.  Last A1C one month ago was 5.6.  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     Surgical History:   None   PMH/PSH/Family History/Social History/Meds/Allergies:         Past Medical History:  Diagnosis Date  . Acute deep vein thrombosis (DVT) of right lower extremity (HCC) 01/18/2021  . Acute pulmonary embolism (HCC) 01/19/2021  . Asthma    . Cellulitis in diabetic foot (HCC)    . Diabetes mellitus without complication (HCC)     . DVT (deep venous thrombosis) (HCC) 02/05/2021  . DVT (deep venous thrombosis) (HCC)    . PE (pulmonary thromboembolism) (HCC) 2016             Past Surgical History:  Procedure Laterality Date  . adenoids      . CESAREAN SECTION            Social History         Socioeconomic History  . Marital status: Married      Spouse name: Not on file  . Number of children: Not on file  . Years of education: Not on file  . Highest education level: Some college, no degree  Occupational History  . Not on file  Tobacco Use  . Smoking status: Never  . Smokeless tobacco: Never  Vaping Use  . Vaping Use: Former  . Substances: CBD  Substance and Sexual Activity  . Alcohol use: Yes  . Drug use: Never  . Sexual activity: Not on file  Other Topics Concern  . Not on file  Social History Narrative    Are you right handed or left handed? Right     Are you currently employed ? No    What is your current occupation?    Do you live at home alone?    Who lives with you? Husband, child and granddaughter  What type of home do you live in: 1 story or 2 story? 1         Social Determinants of Health        Financial Resource Strain: Medium Risk (11/30/2022)    Overall Financial Resource Strain (CARDIA)    . Difficulty of Paying Living Expenses: Somewhat hard  Food Insecurity: Patient Declined (11/30/2022)    Hunger Vital Sign    . Worried About Programme researcher, broadcasting/film/video in the Last Year: Patient declined    . Ran Out of Food in the Last Year: Patient declined  Transportation Needs: Unmet Transportation Needs (11/30/2022)    PRAPARE - Transportation    . Lack of Transportation (Medical): Yes    . Lack of Transportation (Non-Medical): Yes  Physical Activity: Insufficiently Active (11/30/2022)    Exercise Vital Sign    . Days of Exercise per Week: 2 days    . Minutes of Exercise per Session: 30 min  Stress: Stress Concern Present (11/30/2022)    [employer] of Occupational Health -  Occupational Stress Questionnaire    . Feeling of Stress : To some extent  Social Connections: Socially Integrated (11/30/2022)    Social Connection and Isolation Panel [NHANES]    . Frequency of Communication with Friends and Family: More than three times a week    . Frequency of Social Gatherings with Friends and Family: Patient declined    . Attends Religious Services: 1 to 4 times per year    . Active Member of Clubs or Organizations: Yes    . Attends Banker Meetings: 1 to 4 times per year    . Marital Status: Married         Family History  Problem Relation Age of Onset  . Deep vein thrombosis Father    . Breast cancer Sister 42        Allergies       Allergies  Allergen Reactions  . Amoxapine And Related Other (See Comments)      Burns skin, hair falls out  . Amoxicillin Other (See Comments)      Burns skin and hair falls out  . Oxycodone Itching  . Percocet [Oxycodone-Acetaminophen] Itching  . Morphine Itching and Other (See Comments)      Must receive Benadryl to tolerate  . Rocephin [Ceftriaxone] Swelling, Rash and Other (See Comments)      Swelling at site where received  . Tramadol Itching and Other (See Comments)      Must receive Benadryl to tolerate            Current Outpatient Medications  Medication Sig Dispense Refill  . Accu-Chek Softclix Lancets lancets Use as instructed 100 each 12  . albuterol (VENTOLIN HFA) 108 (90 Base) MCG/ACT inhaler Inhale 2 puffs into the lungs every 6 (six) hours as needed for wheezing or shortness of breath. 8.5 g 0  . apixaban (ELIQUIS) 5 MG TABS tablet Take 1 tablet (5 mg total) by mouth 2 (two) times daily. 60 tablet 5  . blood glucose meter kit and supplies KIT Dispense based on patient and insurance preference. Use up to four times daily as directed. 1 each 0  . Erenumab-aooe (AIMOVIG) 140 MG/ML SOAJ Inject 140 mg into the skin every 28 (twenty-eight) days. 1.12 mL 5  . glucose blood (ACCU-CHEK GUIDE) test  strip Use as instructed 100 each 12  . lidocaine (LIDODERM) 5 % Place 1 patch onto the skin daily. Remove & Discard patch within  12 hours or as directed by MD 30 patch 0  . metFORMIN (GLUCOPHAGE-XR) 500 MG 24 hr tablet Take 2 tablets (1,000 mg total) by mouth 2 (two) times daily. 360 tablet 3  . ondansetron (ZOFRAN-ODT) 4 MG disintegrating tablet Take 1 tablet (4 mg total) by mouth every 8 (eight) hours as needed for nausea or vomiting. 20 tablet 5  . Semaglutide, 1 MG/DOSE, (OZEMPIC, 1 MG/DOSE,) 4 MG/3ML SOPN Inject 1 mg as directed once a week. 3 mL 2      No current facility-administered medications for this visit.      Imaging Results (Last 48 hours)  No results found.     Review of Systems:   A ROS was performed including pertinent positives and negatives as documented in the HPI.   Physical Exam :   Constitutional: NAD and appears stated age Neurological: Alert and oriented Psych: Appropriate affect and cooperative There were no vitals taken for this visit.    Comprehensive Musculoskeletal Exam:       Musculoskeletal Exam      Inspection Right Left  Skin No atrophy or winging No atrophy or winging  Palpation      Tenderness None Anterior glenohumeral joint  Range of Motion      Flexion (passive) 160 130  Flexion (active) 160 120  ER at the side 50 30  Can reach behind back to L4 Back pocket  Strength        5/5 Limited due to pain  Special Tests      Pseudoparalytic No No  Neurologic      Fires PIN, radial, median, ulnar, musculocutaneous, axillary, suprascapular, long thoracic, and spinal accessory innervated muscles. No abnormal sensibility  Vascular/Lymphatic      Radial Pulse 2+ 2+  Cervical Exam      Patient has symmetric cervical range of motion with negative Spurling's test.  Special Test: N/A      Imaging:         Assessment:   43 y.o. female with adhesive capsulitis of the left shoulder.  I did discuss that at this time I would recommend 1  additional injection of the left shoulder.  If she does not get any persistent relief from this I will plan to see her back in 2 weeks to make that time we will discuss possible MRI Plan :     -Left shoulder ultrasound-guided injection provided verbal consent obtained    Procedure Note  Patient: Shannon Oneal             Date of Birth: [birth year]           MRN: [id number]             Visit Date: 08/24/2023  Procedures: Visit Diagnoses:  1. Adhesive capsulitis of left shoulder     Large Joint Inj: L glenohumeral on 08/24/2023 10:38 AM Indications: pain Details: 22 G 1.5 in needle, ultrasound-guided anterior approach  Arthrogram: No  Medications: 4 mL lidocaine 1 %; 80 mg triamcinolone acetonide 40 MG/ML Outcome: tolerated well, no immediate complications Procedure, treatment alternatives, risks and benefits explained, specific risks discussed. Consent was given by the patient. Immediately prior to procedure a time out was called to verify the correct patient, procedure, equipment, support staff and site/side marked as required. Patient was prepped and draped in the usual sterile fashion.

## 2023-08-31 ENCOUNTER — Ambulatory Visit: Payer: Self-pay | Admitting: Nurse Practitioner

## 2023-09-06 ENCOUNTER — Ambulatory Visit (HOSPITAL_BASED_OUTPATIENT_CLINIC_OR_DEPARTMENT_OTHER): Payer: Commercial Managed Care - PPO | Admitting: Orthopaedic Surgery

## 2023-09-07 ENCOUNTER — Encounter (HOSPITAL_BASED_OUTPATIENT_CLINIC_OR_DEPARTMENT_OTHER): Payer: Self-pay | Admitting: Student

## 2023-09-07 ENCOUNTER — Ambulatory Visit (HOSPITAL_BASED_OUTPATIENT_CLINIC_OR_DEPARTMENT_OTHER): Payer: Commercial Managed Care - PPO | Admitting: Student

## 2023-09-07 ENCOUNTER — Other Ambulatory Visit (HOSPITAL_BASED_OUTPATIENT_CLINIC_OR_DEPARTMENT_OTHER): Payer: Self-pay

## 2023-09-07 DIAGNOSIS — M7502 Adhesive capsulitis of left shoulder: Secondary | ICD-10-CM | POA: Diagnosis not present

## 2023-09-07 MED ORDER — TRAMADOL HCL 50 MG PO TABS
50.0000 mg | ORAL_TABLET | Freq: Four times a day (QID) | ORAL | 0 refills | Status: AC | PRN
Start: 2023-09-07 — End: 2023-09-12
  Filled 2023-09-07: qty 20, 5d supply, fill #0

## 2023-09-07 NOTE — Progress Notes (Signed)
Chief Complaint: Left shoulder pain     History of Present Illness:   09/07/23: Patient is here today for follow-up of her left shoulder.  She does have history of adhesive capsulitis in 2024 which did resolve with cortisone injections.  She was seen in clinic 2 weeks ago for pain and stiffness consistent with frozen shoulder and received a repeat injection however this has not given her any relief and may be slightly worse.  She rates pain today at a 7/10 and describes this as sharp.  She is pain throughout the left shoulder which is worse in the posterior shoulder and does have some radiation up towards the neck.  Denies any numbness or tingling.   12/29/22: Shannon Oneal is a 43 y.o. female presents today for evaluation of left shoulder pain after she was referred by her PCP yesterday.  Her pain started a few weeks ago without any known injury or cause.  She has moderate amounts of pain located in the shoulder joint as well as the upper back between the neck and shoulder.  Her range of motion has been very limited and inhibits her from performing normal daily activities.  She has had trouble sleeping at night.  She has tried lidocaine patches and received a Toradol injection yesterday that she hasn't felt improvement from yet.  Denies and numbness or tingling in the shoulder or down the arm.  She is a type 2 diabetic and is taking Ozempic.  Last A1C one month ago was 5.6.  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  Surgical History:   None  PMH/PSH/Family History/Social History/Meds/Allergies:    Past Medical History:  Diagnosis Date   Acute deep vein thrombosis (DVT) of right lower extremity (HCC) 01/18/2021   Acute pulmonary embolism (HCC) 01/19/2021   Asthma    Cellulitis in diabetic foot (HCC)    Diabetes mellitus without complication (HCC)    DVT (deep venous thrombosis) (HCC) 02/05/2021   DVT (deep venous thrombosis) (HCC)    PE (pulmonary  thromboembolism) (HCC) 2016   Past Surgical History:  Procedure Laterality Date   adenoids     CESAREAN SECTION     Social History   Socioeconomic History   Marital status: Married    Spouse name: Not on file   Number of children: Not on file   Years of education: Not on file   Highest education level: Some college, no degree  Occupational History   Not on file  Tobacco Use   Smoking status: Never   Smokeless tobacco: Never  Vaping Use   Vaping status: Former   Substances: CBD  Substance and Sexual Activity   Alcohol use: Yes   Drug use: Never   Sexual activity: Not on file  Other Topics Concern   Not on file  Social History Narrative   Are you right handed or left handed? Right    Are you currently employed ? No   What is your current occupation?   Do you live at home alone?   Who lives with you? Husband, child and granddaughter   What type of home do you live in: 1 story or 2 story? 1       Social Drivers of Corporate investment banker Strain: Medium Risk (11/30/2022)   Overall  Financial Resource Strain (CARDIA)    Difficulty of Paying Living Expenses: Somewhat hard  Food Insecurity: Food Insecurity Present (04/25/2023)   Hunger Vital Sign    Worried About Running Out of Food in the Last Year: Sometimes true    Ran Out of Food in the Last Year: Sometimes true  Transportation Needs: No Transportation Needs (04/25/2023)   PRAPARE - Administrator, Civil Service (Medical): No    Lack of Transportation (Non-Medical): No  Physical Activity: Insufficiently Active (11/30/2022)   Exercise Vital Sign    Days of Exercise per Week: 2 days    Minutes of Exercise per Session: 30 min  Stress: Stress Concern Present (11/30/2022)   [employer] of Occupational Health - Occupational Stress Questionnaire    Feeling of Stress : To some extent  Social Connections: Not on File (04/06/2023)   Received from [facility]   Social Connections    Connectedness: 0   Family  History  Problem Relation Age of Onset   Deep vein thrombosis Father    Breast cancer Sister 55   Allergies  Allergen Reactions   Amoxapine And Related Other (See Comments)    Burns skin, hair falls out   Amoxicillin Other (See Comments)    Burns skin and hair falls out   Oxycodone Itching   Percocet [Oxycodone-Acetaminophen] Itching   Morphine Itching and Other (See Comments)    Must receive Benadryl to tolerate   Rocephin [Ceftriaxone] Swelling, Rash and Other (See Comments)    Swelling at site where received   Tramadol Itching and Other (See Comments)    Must receive Benadryl to tolerate   Current Outpatient Medications  Medication Sig Dispense Refill   traMADol (ULTRAM) 50 MG tablet Take 1 tablet (50 mg total) by mouth every 6 (six) hours as needed for up to 5 days. 20 tablet 0   Accu-Chek Softclix Lancets lancets Use as instructed 100 each 12   albuterol (VENTOLIN HFA) 108 (90 Base) MCG/ACT inhaler Inhale 2 puffs into the lungs every 6 (six) hours as needed for wheezing or shortness of breath. 8.5 g 0   apixaban (ELIQUIS) 5 MG TABS tablet Take 1 tablet (5 mg total) by mouth 2 (two) times daily. 60 tablet 5   blood glucose meter kit and supplies KIT Dispense based on patient and insurance preference. Use up to four times daily as directed. 1 each 0   Erenumab-aooe (AIMOVIG) 140 MG/ML SOAJ Inject 140 mg into the skin every 28 (twenty-eight) days. 1.12 mL 5   furosemide (LASIX) 20 MG tablet Take 1 tablet (20 mg total) by mouth daily. 6 tablet 0   glucose blood (ACCU-CHEK GUIDE) test strip Use as instructed 100 each 12   metFORMIN (GLUCOPHAGE-XR) 500 MG 24 hr tablet Take 2 tablets (1,000 mg total) by mouth 2 (two) times daily. 360 tablet 3   predniSONE (DELTASONE) 20 MG tablet Take 1 tablet (20 mg total) by mouth daily with breakfast. 5 tablet 0   Semaglutide, 1 MG/DOSE, (OZEMPIC, 1 MG/DOSE,) 4 MG/3ML SOPN Inject 1 mg as directed once a week. 3 mL 0   tiZANidine (ZANAFLEX) 4 MG  tablet Take 1 tablet (4 mg total) by mouth every 6 (six) hours as needed for muscle spasms. 30 tablet 0   No current facility-administered medications for this visit.   No results found.  Review of Systems:   A ROS was performed including pertinent positives and negatives as documented in the HPI.  Physical Exam :  Constitutional: NAD and appears stated age Neurological: Alert and oriented Psych: Appropriate affect and cooperative Last menstrual period 08/07/2023.   Comprehensive Musculoskeletal Exam:    Musculoskeletal Exam    Inspection Right Left  Skin No atrophy or winging No atrophy or winging  Palpation    Tenderness None Lateral deltoid and posterior glenohumeral joint  Range of Motion    Flexion (passive) 160 120  Flexion (active) 160 80  ER at the side 50 20  Can reach behind back to L4 Back pocket  Strength     5/5 Limited due to pain  Special Tests    Pseudoparalytic No No  Neurologic    Fires PIN, radial, median, ulnar, musculocutaneous, axillary, suprascapular, long thoracic, and spinal accessory innervated muscles. No abnormal sensibility  Vascular/Lymphatic    Radial Pulse 2+ 2+  Cervical Exam    Patient has symmetric cervical range of motion with negative Spurling's test.  No tenderness in the cervical spine or paraspinal muscles.  Special Test: N/A    Imaging:     Assessment:   43 y.o. female with persistent moderate to severe pain in the left shoulder.  She has history of adhesive capsulitis and I believe this is the most likely cause of her pain given restricted active/passive ROM.  I did discuss that some pain could be radiating from the cervical spine given pain in the posterior shoulder and scapular area however exam for this today was overall negative.  At this point I would like to obtain an MRI of the left shoulder for further assessment.  Will send tramadol in for pain which she has taken previously at bedtime with antihistamines and tolerated  well.  Plan :    -Obtain stat left shoulder MRI and return to clinic for follow-up and treatment discussion -Start tramadol 50 mg as needed for pain    I personally saw and evaluated the patient, and participated in the management and treatment plan.  Hazle Nordmann, PA-C Orthopedics

## 2023-09-07 NOTE — Addendum Note (Signed)
Addended by: Myer Haff on: 09/07/2023 01:07 PM   Modules accepted: Orders

## 2023-09-11 ENCOUNTER — Encounter (HOSPITAL_BASED_OUTPATIENT_CLINIC_OR_DEPARTMENT_OTHER): Payer: Self-pay | Admitting: Student

## 2023-09-14 ENCOUNTER — Other Ambulatory Visit: Payer: Commercial Managed Care - PPO

## 2023-09-14 ENCOUNTER — Other Ambulatory Visit: Payer: Self-pay

## 2023-09-14 ENCOUNTER — Ambulatory Visit
Admission: RE | Admit: 2023-09-14 | Discharge: 2023-09-14 | Disposition: A | Discharge status: Home / Self Care | Payer: Commercial Managed Care - PPO | Source: Ambulatory Visit | Attending: Student | Admitting: Student

## 2023-09-14 DIAGNOSIS — M7502 Adhesive capsulitis of left shoulder: Secondary | ICD-10-CM

## 2023-09-14 MED ORDER — OZEMPIC (1 MG/DOSE) 4 MG/3ML ~~LOC~~ SOPN
1.0000 mg | PEN_INJECTOR | SUBCUTANEOUS | 0 refills | Status: DC
  Filled 2023-09-14 (×3): qty 3, 28d supply, fill #0

## 2023-09-19 ENCOUNTER — Other Ambulatory Visit: Payer: Self-pay

## 2023-09-21 ENCOUNTER — Other Ambulatory Visit: Payer: Self-pay

## 2023-09-25 ENCOUNTER — Telehealth (HOSPITAL_BASED_OUTPATIENT_CLINIC_OR_DEPARTMENT_OTHER): Payer: Self-pay | Admitting: Student

## 2023-09-25 NOTE — Telephone Encounter (Signed)
 Patient need a refill on tramadol and she needs meds to be sedated for MRI

## 2023-09-26 ENCOUNTER — Other Ambulatory Visit (HOSPITAL_BASED_OUTPATIENT_CLINIC_OR_DEPARTMENT_OTHER): Payer: Self-pay | Admitting: Student

## 2023-09-26 MED ORDER — LORAZEPAM 1 MG PO TABS: 1.0000 mg | ORAL_TABLET | Freq: Once | ORAL | 0 refills | Status: AC

## 2023-09-26 MED ORDER — TRAMADOL HCL 50 MG PO TABS
50.0000 mg | ORAL_TABLET | Freq: Four times a day (QID) | ORAL | 0 refills | Status: AC | PRN
Start: 2023-09-26 — End: 2023-10-01

## 2023-09-26 NOTE — Telephone Encounter (Signed)
 Spoke to pt and sent short term refill of Tramadol for pain and one dose of Ativan for MRI anxiety/claustrophobia.

## 2023-10-11 ENCOUNTER — Other Ambulatory Visit: Payer: Self-pay | Admitting: Student

## 2023-10-11 DIAGNOSIS — M7502 Adhesive capsulitis of left shoulder: Secondary | ICD-10-CM

## 2023-10-19 ENCOUNTER — Encounter: Payer: Self-pay | Admitting: Orthopaedic Surgery

## 2023-10-21 ENCOUNTER — Other Ambulatory Visit: Payer: Self-pay | Admitting: Nurse Practitioner

## 2023-10-21 ENCOUNTER — Ambulatory Visit
Admission: RE | Admit: 2023-10-21 | Discharge: 2023-10-21 | Disposition: A | Discharge status: Home / Self Care | Source: Ambulatory Visit | Attending: Student

## 2023-10-21 DIAGNOSIS — M7502 Adhesive capsulitis of left shoulder: Secondary | ICD-10-CM

## 2023-10-31 ENCOUNTER — Encounter (HOSPITAL_BASED_OUTPATIENT_CLINIC_OR_DEPARTMENT_OTHER): Payer: Self-pay

## 2023-11-14 ENCOUNTER — Encounter (HOSPITAL_BASED_OUTPATIENT_CLINIC_OR_DEPARTMENT_OTHER): Payer: Self-pay

## 2023-11-14 ENCOUNTER — Telehealth (HOSPITAL_BASED_OUTPATIENT_CLINIC_OR_DEPARTMENT_OTHER): Payer: Self-pay

## 2023-11-14 NOTE — Telephone Encounter (Signed)
-----   Message from Amedeo Jupiter sent at 11/14/2023 12:40 PM EDT ----- She was going to schedule an MRI review with Dr B, but I don't see one yet.  Can we see if she would like to schedule this? ----- Message ----- From: Interface, Rad Results In Sent: 11/09/2023  12:29 PM EDT To: Amedeo Jupiter, PA-C

## 2023-11-14 NOTE — Telephone Encounter (Signed)
 FYI Sent pt portal message to set appt up

## 2023-11-23 ENCOUNTER — Other Ambulatory Visit (HOSPITAL_BASED_OUTPATIENT_CLINIC_OR_DEPARTMENT_OTHER): Payer: Self-pay

## 2023-11-23 ENCOUNTER — Ambulatory Visit (HOSPITAL_BASED_OUTPATIENT_CLINIC_OR_DEPARTMENT_OTHER): Admitting: Orthopaedic Surgery

## 2023-11-23 ENCOUNTER — Ambulatory Visit (HOSPITAL_BASED_OUTPATIENT_CLINIC_OR_DEPARTMENT_OTHER): Payer: Self-pay | Admitting: Orthopaedic Surgery

## 2023-11-23 DIAGNOSIS — M7502 Adhesive capsulitis of left shoulder: Secondary | ICD-10-CM

## 2023-11-23 MED ORDER — ACETAMINOPHEN 500 MG PO TABS
500.0000 mg | ORAL_TABLET | Freq: Three times a day (TID) | ORAL | 0 refills | Status: AC
  Filled 2023-11-23: qty 30, 10d supply, fill #0

## 2023-11-23 MED ORDER — ASPIRIN 325 MG PO TBEC
325.0000 mg | DELAYED_RELEASE_TABLET | Freq: Every day | ORAL | 0 refills | Status: DC
  Filled 2023-11-23: qty 14, 14d supply, fill #0

## 2023-11-23 MED ORDER — IBUPROFEN 800 MG PO TABS
800.0000 mg | ORAL_TABLET | Freq: Three times a day (TID) | ORAL | 0 refills | Status: AC
  Filled 2023-11-23: qty 30, 10d supply, fill #0

## 2023-11-23 MED ORDER — OXYCODONE HCL 5 MG PO TABS
5.0000 mg | ORAL_TABLET | ORAL | 0 refills | Status: DC | PRN
  Filled 2023-11-23: qty 10, 2d supply, fill #0

## 2023-11-23 NOTE — Progress Notes (Signed)
 Chief Complaint: Left shoulder pain        History of Present Illness:   11/23/2023: Presents today for follow-up of her left shoulder.  She is still having persistent pain with very limited overhead range of motion or reaching for anything    01/12/23: Patient presents today for follow-up evaluation of left adhesive capsulitis.  We did perform a cortisone injection of the left glenohumeral joint 2 weeks ago at the last visit.  She does note significant improvement with overall less pain and improved range of motion.  States that she feels 60% better at this point.  Does note continued soreness of the shoulder in the morning however this does improve later in the day.  Not currently taking any pain medications.    Shannon Oneal is a 43 y.o. female presents today for evaluation of left shoulder pain after she was referred by her PCP yesterday.  Her pain started a few weeks ago without any known injury or cause.  She has moderate amounts of pain located in the shoulder joint as well as the upper back between the neck and shoulder.  Her range of motion has been very limited and inhibits her from performing normal daily activities.  She has had trouble sleeping at night.  She has tried lidocaine  patches and received a Toradol  injection yesterday that she hasn't felt improvement from yet.  Denies and numbness or tingling in the shoulder or down the arm.  She is a type 2 diabetic and is taking Ozempic .  Last A1C one month ago was 5.6.  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     Surgical History:   None   PMH/PSH/Family History/Social History/Meds/Allergies:         Past Medical History:  Diagnosis Date   Acute deep vein thrombosis (DVT) of right lower extremity (HCC) 01/18/2021   Acute pulmonary embolism (HCC) 01/19/2021   Asthma     Cellulitis in diabetic foot (HCC)     Diabetes mellitus without complication (HCC)     DVT (deep venous thrombosis) (HCC) 02/05/2021    DVT (deep venous thrombosis) (HCC)     PE (pulmonary thromboembolism) (HCC) 2016             Past Surgical History:  Procedure Laterality Date   adenoids       CESAREAN SECTION            Social History         Socioeconomic History   Marital status: Married      Spouse name: Not on file   Number of children: Not on file   Years of education: Not on file   Highest education level: Some college, no degree  Occupational History   Not on file  Tobacco Use   Smoking status: Never   Smokeless tobacco: Never  Vaping Use   Vaping Use: Former   Substances: CBD  Substance and Sexual Activity   Alcohol use: Yes   Drug use: Never   Sexual activity: Not on file  Other Topics Concern   Not on file  Social History Narrative    Are you right handed or left handed? Right     Are you currently employed ? No    What is your current occupation?    Do you live at home alone?    Who lives with you? Husband, child and granddaughter    What type of home do you live  in: 1 story or 2 story? 1         Social Determinants of Health        Financial Resource Strain: Medium Risk (11/30/2022)    Overall Financial Resource Strain (CARDIA)     Difficulty of Paying Living Expenses: Somewhat hard  Food Insecurity: Patient Declined (11/30/2022)    Hunger Vital Sign     Worried About Running Out of Food in the Last Year: Patient declined     Ran Out of Food in the Last Year: Patient declined  Transportation Needs: Unmet Transportation Needs (11/30/2022)    PRAPARE - Therapist, art (Medical): Yes     Lack of Transportation (Non-Medical): Yes  Physical Activity: Insufficiently Active (11/30/2022)    Exercise Vital Sign     Days of Exercise per Week: 2 days     Minutes of Exercise per Session: 30 min  Stress: Stress Concern Present (11/30/2022)    [employer] of Occupational Health - Occupational Stress Questionnaire     Feeling of Stress : To some extent  Social  Connections: Socially Integrated (11/30/2022)    Social Connection and Isolation Panel [NHANES]     Frequency of Communication with Friends and Family: More than three times a week     Frequency of Social Gatherings with Friends and Family: Patient declined     Attends Religious Services: 1 to 4 times per year     Active Member of [employer] or Organizations: Yes     Attends Banker Meetings: 1 to 4 times per year     Marital Status: Married         Family History  Problem Relation Age of Onset   Deep vein thrombosis Father     Breast cancer Sister 44        Allergies       Allergies  Allergen Reactions   Amoxapine And Related Other (See Comments)      Burns skin, hair falls out   Amoxicillin Other (See Comments)      Burns skin and hair falls out   Oxycodone  Itching   Percocet [Oxycodone -Acetaminophen ] Itching   Morphine  Itching and Other (See Comments)      Must receive Benadryl  to tolerate   Rocephin [Ceftriaxone] Swelling, Rash and Other (See Comments)      Swelling at site where received   Tramadol  Itching and Other (See Comments)      Must receive Benadryl  to tolerate            Current Outpatient Medications  Medication Sig Dispense Refill   Accu-Chek Softclix Lancets lancets Use as instructed 100 each 12   albuterol  (VENTOLIN  HFA) 108 (90 Base) MCG/ACT inhaler Inhale 2 puffs into the lungs every 6 (six) hours as needed for wheezing or shortness of breath. 8.5 g 0   apixaban  (ELIQUIS ) 5 MG TABS tablet Take 1 tablet (5 mg total) by mouth 2 (two) times daily. 60 tablet 5   blood glucose meter kit and supplies KIT Dispense based on patient and insurance preference. Use up to four times daily as directed. 1 each 0   Erenumab -aooe (AIMOVIG ) 140 MG/ML SOAJ Inject 140 mg into the skin every 28 (twenty-eight) days. 1.12 mL 5   glucose blood (ACCU-CHEK GUIDE) test strip Use as instructed 100 each 12   lidocaine  (LIDODERM ) 5 % Place 1 patch onto the skin daily. Remove &  Discard patch within 12 hours or as directed by MD  30 patch 0   metFORMIN  (GLUCOPHAGE -XR) 500 MG 24 hr tablet Take 2 tablets (1,000 mg total) by mouth 2 (two) times daily. 360 tablet 3   ondansetron  (ZOFRAN -ODT) 4 MG disintegrating tablet Take 1 tablet (4 mg total) by mouth every 8 (eight) hours as needed for nausea or vomiting. 20 tablet 5   Semaglutide , 1 MG/DOSE, (OZEMPIC , 1 MG/DOSE,) 4 MG/3ML SOPN Inject 1 mg as directed once a week. 3 mL 2      No current facility-administered medications for this visit.      Imaging Results (Last 48 hours)  No results found.     Review of Systems:   A ROS was performed including pertinent positives and negatives as documented in the HPI.   Physical Exam :   Constitutional: NAD and appears stated age Neurological: Alert and oriented Psych: Appropriate affect and cooperative There were no vitals taken for this visit.    Comprehensive Musculoskeletal Exam:       Musculoskeletal Exam      Inspection Right Left  Skin No atrophy or winging No atrophy or winging  Palpation      Tenderness None Anterior glenohumeral joint  Range of Motion      Flexion (passive) 160 130  Flexion (active) 160 120  ER at the side 50 30  Can reach behind back to L4 Back pocket  Strength        5/5 Limited due to pain  Special Tests      Pseudoparalytic No No  Neurologic      Fires PIN, radial, median, ulnar, musculocutaneous, axillary, suprascapular, long thoracic, and spinal accessory innervated muscles. No abnormal sensibility  Vascular/Lymphatic      Radial Pulse 2+ 2+  Cervical Exam      Patient has symmetric cervical range of motion with negative Spurling's test.  Special Test: N/A      Imaging:      MRI left shoulder: There is evidence of inferior glenohumeral thickening with intrasubstance tearing of the rotator cuff but no frank bursal articular sided tear   Assessment:   43 y.o. female with adhesive capsulitis of the left shoulder.  At this  time she has now had multiple injections without relief.  If anything this did somewhat aggravate and worsen her symptoms.  At this time I did discuss that based on her MRI I am not necessarily concerned for a full-thickness or even partial-thickness rotator cuff tear to that effect I do believe she would benefit from an acromioplasty with capsular release and manipulation.  I did discuss risks and benefits as well as limitations associated with this.  -Plan for left shoulder arthroscopy with capsular release and acromioplasty   After a lengthy discussion of treatment options, including risks, benefits, alternatives, complications of surgical and nonsurgical conservative options, the patient elected surgical repair.   The patient  is aware of the material risks  and complications including, but not limited to injury to adjacent structures, neurovascular injury, infection, numbness, bleeding, implant failure, thermal burns, stiffness, persistent pain, failure to heal, disease transmission from allograft, need for further surgery, dislocation, anesthetic risks, blood clots, risks of death,and others. The probabilities of surgical success and failure discussed with patient given their particular co-morbidities.The time and nature of expected rehabilitation and recovery was discussed.The patient's questions were all answered preoperatively.  No barriers to understanding were noted. I explained the natural history of the disease process and Rx rationale.  I explained to the patient what I considered to  be reasonable expectations given their personal situation.  The final treatment plan was arrived at through a shared patient decision making process model.

## 2023-12-25 ENCOUNTER — Telehealth: Admitting: Physician Assistant

## 2023-12-25 DIAGNOSIS — J208 Acute bronchitis due to other specified organisms: Secondary | ICD-10-CM | POA: Diagnosis not present

## 2023-12-25 DIAGNOSIS — B9689 Other specified bacterial agents as the cause of diseases classified elsewhere: Secondary | ICD-10-CM

## 2023-12-25 MED ORDER — PREDNISONE 10 MG (21) PO TBPK: ORAL_TABLET | ORAL | 0 refills | Status: DC

## 2023-12-25 MED ORDER — BENZONATATE 100 MG PO CAPS: 100.0000 mg | ORAL_CAPSULE | Freq: Three times a day (TID) | ORAL | 0 refills | Status: DC | PRN

## 2023-12-25 MED ORDER — AZITHROMYCIN 250 MG PO TABS: ORAL_TABLET | ORAL | 0 refills | Status: AC

## 2023-12-25 NOTE — Progress Notes (Signed)
E-Visit for Cough  We are sorry that you are not feeling well.  Here is how we plan to help!  Based on your presentation I believe you most likely have A cough due to bacteria.  When patients have a fever and a productive cough with a change in color or increased sputum production, we are concerned about bacterial bronchitis.  If left untreated it can progress to pneumonia.  If your symptoms do not improve with your treatment plan it is important that you contact your provider.   I have prescribed Azithromyin 250 mg: two tablets now and then one tablet daily for 4 additonal days    In addition you may use A non-prescription cough medication called Mucinex DM: take 2 tablets every 12 hours. and A prescription cough medication called Tessalon Perles 100mg . You may take 1-2 capsules every 8 hours as needed for your cough.  Prednisone 10 mg daily for 6 days (see taper instructions below)  Directions for 6 day taper: Day 1: 2 tablets before breakfast, 1 after both lunch & dinner and 2 at bedtime Day 2: 1 tab before breakfast, 1 after both lunch & dinner and 2 at bedtime Day 3: 1 tab at each meal & 1 at bedtime Day 4: 1 tab at breakfast, 1 at lunch, 1 at bedtime Day 5: 1 tab at breakfast & 1 tab at bedtime Day 6: 1 tab at breakfast  From your responses in the eVisit questionnaire you describe inflammation in the upper respiratory tract which is causing a significant cough.  This is commonly called Bronchitis and has four common causes:   Allergies Viral Infections Acid Reflux Bacterial Infection Allergies, viruses and acid reflux are treated by controlling symptoms or eliminating the cause. An example might be a cough caused by taking certain blood pressure medications. You stop the cough by changing the medication. Another example might be a cough caused by acid reflux. Controlling the reflux helps control the cough.  USE OF BRONCHODILATOR ("RESCUE") INHALERS: There is a risk from using your  bronchodilator too frequently.  The risk is that over-reliance on a medication which only relaxes the muscles surrounding the breathing tubes can reduce the effectiveness of medications prescribed to reduce swelling and congestion of the tubes themselves.  Although you feel brief relief from the bronchodilator inhaler, your asthma may actually be worsening with the tubes becoming more swollen and filled with mucus.  This can delay other crucial treatments, such as oral steroid medications. If you need to use a bronchodilator inhaler daily, several times per day, you should discuss this with your provider.  There are probably better treatments that could be used to keep your asthma under control.     HOME CARE Only take medications as instructed by your medical team. Complete the entire course of an antibiotic. Drink plenty of fluids and get plenty of rest. Avoid close contacts especially the very young and the elderly Cover your mouth if you cough or cough into your sleeve. Always remember to wash your hands A steam or ultrasonic humidifier can help congestion.   GET HELP RIGHT AWAY IF: You develop worsening fever. You become short of breath You cough up blood. Your symptoms persist after you have completed your treatment plan MAKE SURE YOU  Understand these instructions. Will watch your condition. Will get help right away if you are not doing well or get worse.    Thank you for choosing an e-visit.  Your e-visit answers were reviewed by a  board certified advanced clinical practitioner to complete your personal care plan. Depending upon the condition, your plan could have included both over the counter or prescription medications.  Please review your pharmacy choice. Make sure the pharmacy is open so you can pick up prescription now. If there is a problem, you may contact your provider through [employer] and have the prescription routed to another pharmacy.  Your safety is important  to [place]. If you have drug allergies check your prescription carefully.   For the next 24 hours you can use MyChart to ask questions about today's visit, request a non-urgent call back, or ask for a work or school excuse. You will get an email in the next two days asking about your experience. I hope that your e-visit has been valuable and will speed your recovery.  I have spent 5 minutes in review of e-visit questionnaire, review and updating patient chart, medical decision making and response to patient.   Margaretann Loveless, PA-C

## 2024-01-04 ENCOUNTER — Telehealth: Payer: Self-pay | Admitting: Orthopaedic Surgery

## 2024-01-04 ENCOUNTER — Other Ambulatory Visit (HOSPITAL_BASED_OUTPATIENT_CLINIC_OR_DEPARTMENT_OTHER): Payer: Self-pay

## 2024-01-04 ENCOUNTER — Other Ambulatory Visit (HOSPITAL_BASED_OUTPATIENT_CLINIC_OR_DEPARTMENT_OTHER): Payer: Self-pay | Admitting: Orthopaedic Surgery

## 2024-01-04 MED ORDER — TRAMADOL HCL 50 MG PO TABS
50.0000 mg | ORAL_TABLET | Freq: Four times a day (QID) | ORAL | 0 refills | Status: DC | PRN
  Filled 2024-01-04: qty 20, 5d supply, fill #0

## 2024-01-04 NOTE — Telephone Encounter (Signed)
 Patient called and said can you send something in for pain. She does not want to use her post op meds til after surgery. CB#[id number]

## 2024-01-14 ENCOUNTER — Other Ambulatory Visit (HOSPITAL_BASED_OUTPATIENT_CLINIC_OR_DEPARTMENT_OTHER): Payer: Self-pay

## 2024-01-24 ENCOUNTER — Other Ambulatory Visit: Payer: Self-pay | Admitting: Nurse Practitioner

## 2024-02-26 ENCOUNTER — Telehealth: Admitting: Physician Assistant

## 2024-02-26 DIAGNOSIS — R0981 Nasal congestion: Secondary | ICD-10-CM

## 2024-02-26 NOTE — Progress Notes (Signed)
   Thank you for the details you included in the comment boxes. Those details are very helpful in determining the best course of treatment for you and help us  to provide the best care. Because you mention laborious breathing, we recommend that you schedule a Virtual Urgent Care video visit in order for the provider to better assess what is going on.  The provider will be able to give you a more accurate diagnosis and treatment plan if we can more freely discuss your symptoms and with the addition of a virtual examination.   If you change your visit to a video visit, we will bill your insurance (similar to an office visit) and you will not be charged for this e-Visit. You will be able to stay at home and speak with the first available [facility] advanced practice provider. The link to do a video visit is in the drop down Menu tab of your Welcome screen in MyChart.        I have spent 5 minutes in review of e-visit questionnaire, review and updating patient chart, medical decision making and response to patient.   Delon CHRISTELLA Dickinson, PA-C

## 2024-02-27 ENCOUNTER — Ambulatory Visit
Admission: EM | Admit: 2024-02-27 | Discharge: 2024-02-27 | Disposition: A | Discharge status: Home / Self Care | Attending: Family Medicine | Admitting: Family Medicine

## 2024-02-27 DIAGNOSIS — E119 Type 2 diabetes mellitus without complications: Secondary | ICD-10-CM | POA: Diagnosis not present

## 2024-02-27 DIAGNOSIS — J4531 Mild persistent asthma with (acute) exacerbation: Secondary | ICD-10-CM | POA: Diagnosis not present

## 2024-02-27 DIAGNOSIS — J209 Acute bronchitis, unspecified: Secondary | ICD-10-CM

## 2024-02-27 MED ORDER — PREDNISONE 20 MG PO TABS: ORAL_TABLET | ORAL | 0 refills | Status: AC

## 2024-02-27 MED ORDER — ALBUTEROL SULFATE HFA 108 (90 BASE) MCG/ACT IN AERS: 1.0000 | INHALATION_SPRAY | Freq: Four times a day (QID) | RESPIRATORY_TRACT | 0 refills | Status: AC | PRN

## 2024-02-27 MED ORDER — AZITHROMYCIN 250 MG PO TABS: ORAL_TABLET | ORAL | 0 refills | Status: DC

## 2024-02-27 NOTE — ED Provider Notes (Signed)
 [facility]  Note:  This document was prepared using Conservation officer, historic buildings and may include unintentional dictation errors.  MRN: [id number] DOB: 12/26/1980  Subjective:   Shannon Oneal is a 43 y.o. female presenting for 4 to 5-day history of acute onset dry hacking cough that elicits chest pain, sinus congestion and drainage.  Has had chest tightness as well.  No fever.  No wheezing, body pains, ear pain, throat pain.  Has a history of asthma, needs a refill on her albuterol  inhaler.  Has a history of a PE and is on Eliquis .  Has type 2 diabetes managed with Ozempic  although she has not been able to get the medication due to it being on backorder.  Last A1c was from January and was less than 6% per patient.  She also takes metformin .  No smoking of any kind including cigarettes, cigars, vaping, marijuana use.    No current facility-administered medications for this encounter.  Current Outpatient Medications:    Accu-Chek Softclix Lancets lancets, Use as instructed, Disp: 100 each, Rfl: 12   albuterol  (VENTOLIN  HFA) 108 (90 Base) MCG/ACT inhaler, Inhale 2 puffs into the lungs every 6 (six) hours as needed for wheezing or shortness of breath., Disp: 8.5 g, Rfl: 0   apixaban  (ELIQUIS ) 5 MG TABS tablet, Take 1 tablet (5 mg total) by mouth 2 (two) times daily., Disp: 60 tablet, Rfl: 5   aspirin  EC 325 MG tablet, Take 1 tablet (325 mg total) by mouth daily., Disp: 14 tablet, Rfl: 0   benzonatate  (TESSALON ) 100 MG capsule, Take 1-2 capsules (100-200 mg total) by mouth 3 (three) times daily as needed., Disp: 30 capsule, Rfl: 0   blood glucose meter kit and supplies KIT, Dispense based on patient and insurance preference. Use up to four times daily as directed., Disp: 1 each, Rfl: 0   Erenumab -aooe (AIMOVIG ) 140 MG/ML SOAJ, Inject 140 mg into the skin every 28 (twenty-eight) days., Disp: 1.12 mL, Rfl: 5   furosemide  (LASIX ) 20 MG tablet, Take 1 tablet (20 mg total) by  mouth daily., Disp: 6 tablet, Rfl: 0   glucose blood (ACCU-CHEK GUIDE) test strip, Use as instructed, Disp: 100 each, Rfl: 12   metFORMIN  (GLUCOPHAGE -XR) 500 MG 24 hr tablet, Take 2 tablets (1,000 mg total) by mouth 2 (two) times daily., Disp: 360 tablet, Rfl: 3   oxyCODONE  (ROXICODONE ) 5 MG immediate release tablet, Take 1 tablet (5 mg total) by mouth every 4 (four) hours as needed for severe pain (pain score 7-10) or breakthrough pain., Disp: 10 tablet, Rfl: 0   OZEMPIC , 1 MG/DOSE, 4 MG/3ML SOPN, INJECT 1MG   SUBCUTANEOUSLY ONCE A WEEK, Disp: 3 mL, Rfl: 0   tiZANidine  (ZANAFLEX ) 4 MG tablet, Take 1 tablet (4 mg total) by mouth every 6 (six) hours as needed for muscle spasms., Disp: 30 tablet, Rfl: 0   traMADol  (ULTRAM ) 50 MG tablet, Take 1 tablet (50 mg total) by mouth every 6 (six) hours as needed., Disp: 20 tablet, Rfl: 0   Allergies  Allergen Reactions   Amoxapine And Related Other (See Comments)    Burns skin, hair falls out   Amoxicillin Other (See Comments)    Burns skin and hair falls out   Oxycodone  Itching   Percocet [Oxycodone -Acetaminophen ] Itching   Morphine  Itching and Other (See Comments)    Must receive Benadryl  to tolerate   Rocephin [Ceftriaxone] Swelling, Rash and Other (See Comments)    Swelling at site where received  Tramadol  Itching and Other (See Comments)    Must receive Benadryl  to tolerate    Past Medical History:  Diagnosis Date   Acute deep vein thrombosis (DVT) of right lower extremity (HCC) 01/18/2021   Acute pulmonary embolism (HCC) 01/19/2021   Asthma    Cellulitis in diabetic foot (HCC)    Diabetes mellitus without complication (HCC)    DVT (deep venous thrombosis) (HCC) 02/05/2021   DVT (deep venous thrombosis) (HCC)    PE (pulmonary thromboembolism) (HCC) 2016     Past Surgical History:  Procedure Laterality Date   adenoids     CESAREAN SECTION      Family History  Problem Relation Age of Onset   Deep vein thrombosis Father    Breast  cancer Sister 15    Social History   Tobacco Use   Smoking status: Former    Types: Cigars   Smokeless tobacco: Never  Vaping Use   Vaping status: Former   Substances: CBD  Substance Use Topics   Alcohol use: Yes    Comment: rare   Drug use: Never    ROS   Objective:   Vitals: BP 125/84 (BP Location: Right Arm)   Pulse 92   Temp 98.4 F (36.9 C) (Oral)   Resp 18   LMP 02/02/2024   SpO2 98%   Physical Exam Constitutional:      General: She is not in acute distress.    Appearance: Normal appearance. She is well-developed and normal weight. She is not ill-appearing, toxic-appearing or diaphoretic.  HENT:     Head: Normocephalic and atraumatic.     Right Ear: Tympanic membrane, ear canal and external ear normal. No drainage or tenderness. No middle ear effusion. There is no impacted cerumen. Tympanic membrane is not erythematous or bulging.     Left Ear: Tympanic membrane, ear canal and external ear normal. No drainage or tenderness.  No middle ear effusion. There is no impacted cerumen. Tympanic membrane is not erythematous or bulging.     Nose: Congestion present. No rhinorrhea.     Mouth/Throat:     Mouth: Mucous membranes are moist. No oral lesions.     Pharynx: No pharyngeal swelling, oropharyngeal exudate, posterior oropharyngeal erythema or uvula swelling.     Tonsils: No tonsillar exudate or tonsillar abscesses.     Comments: Thick streaks of postnasal drainage overlying pharynx.  Eyes:     General: No scleral icterus.       Right eye: No discharge.        Left eye: No discharge.     Extraocular Movements: Extraocular movements intact.     Right eye: Normal extraocular motion.     Left eye: Normal extraocular motion.     Conjunctiva/sclera: Conjunctivae normal.  Cardiovascular:     Rate and Rhythm: Normal rate and regular rhythm.     Heart sounds: Normal heart sounds. No murmur heard.    No friction rub. No gallop.  Pulmonary:     Effort: Pulmonary  effort is normal. No respiratory distress.     Breath sounds: No stridor. Rhonchi (upper-mid) present. No wheezing or rales.  Chest:     Chest wall: No tenderness.  Musculoskeletal:     Cervical back: Normal range of motion and neck supple.  Lymphadenopathy:     Cervical: No cervical adenopathy.  Skin:    General: Skin is warm and dry.  Neurological:     General: No focal deficit present.     Mental Status:  She is alert and oriented to person, place, and time.  Psychiatric:        Mood and Affect: Mood normal.        Behavior: Behavior normal.     Assessment and Plan :   PDMP not reviewed this encounter.  1. Acute bronchitis, unspecified organism   2. Mild persistent asthma with (acute) exacerbation   3. Type 2 diabetes mellitus treated without insulin  (HCC)    Will manage for acute bronchitis with azithromycin , prednisone .  Refilled her albuterol  inhaler.  Recommend supportive care otherwise.  Counseled patient on potential for adverse effects with medications prescribed/recommended today, ER and return-to-clinic precautions discussed, patient verbalized understanding.    Christopher Savannah, [place] 02/27/24 [id number]

## 2024-02-27 NOTE — ED Triage Notes (Addendum)
 Pt c/o nasal congestion, occasional cough and my chest burns x 4-5 days-denies fever-trying multiple OTC cold/flu meds-NAD-steady gait

## 2024-04-18 ENCOUNTER — Other Ambulatory Visit: Payer: Self-pay | Admitting: Nurse Practitioner

## 2024-05-26 ENCOUNTER — Encounter: Payer: Self-pay | Admitting: Radiology

## 2024-05-30 ENCOUNTER — Telehealth (HOSPITAL_BASED_OUTPATIENT_CLINIC_OR_DEPARTMENT_OTHER): Payer: Self-pay | Admitting: Orthopaedic Surgery

## 2024-05-30 NOTE — Telephone Encounter (Signed)
 Patient states that she has been waiting on surgery date since May of 20\25 and she has called April left messages and she just wants to know what is going on because she is in a lot of pain. Best contact number for patient [phone]

## 2024-06-04 ENCOUNTER — Telehealth: Payer: Self-pay | Admitting: Nurse Practitioner

## 2024-06-04 NOTE — Telephone Encounter (Signed)
 Copied from CRM [id number]. Topic: General - Other >> Jun 04, 2024  2:32 PM Zebedee SAUNDERS wrote: Reason for CRM: Received call from [facility] per April ph: [phone] fax: [phone] sent surgery clearance on 11/23/2023 but never hear anything back. We did receive it but not response was given. Please call or fax response.

## 2024-06-04 NOTE — Telephone Encounter (Signed)
 FYI: I tried reaching patient at number listed below, number on chart. Phone rings and states call cannot be completed as dialed try later. Surgery sheet from 11/23/23 states General clearance needed for surgery. Patient on Eliquis  and Ozempic  which both have to be held before surgery. A clearance sheet was sent to GP, but it looks as if it was filed and not addressed. I called GP office today to try to get ASAP response in regards to clearance. A message was taken for someone to call me back, or fax completed correspondence to us . I do not have any record / or past voicemails from this patient noted or that I can find until 05/30/24. I will continue to try to reach patient in regards to this matter.

## 2024-06-06 NOTE — Telephone Encounter (Signed)
 Called that office to have them to resend form to be signed. KH

## 2024-06-13 ENCOUNTER — Telehealth (HOSPITAL_BASED_OUTPATIENT_CLINIC_OR_DEPARTMENT_OTHER): Payer: Self-pay | Admitting: Orthopaedic Surgery

## 2024-06-13 NOTE — Telephone Encounter (Signed)
 Patient states she is in a lot of pain and would like pain meds or please advise what should she do. Patient wants to know about her surgery please contact patient [phone]

## 2024-06-14 ENCOUNTER — Other Ambulatory Visit (HOSPITAL_BASED_OUTPATIENT_CLINIC_OR_DEPARTMENT_OTHER): Payer: Self-pay

## 2024-06-16 ENCOUNTER — Telehealth: Payer: Self-pay | Admitting: Nurse Practitioner

## 2024-06-16 NOTE — Telephone Encounter (Signed)
 Copied from CRM #[id number]. Topic: Clinical - Medical Advice >> Jun 13, 2024  4:32 PM Kevelyn M wrote: Reason for CRM: Patient calling in regards to ortho surgery clearance status. Please advise  Call back # [phone] or can response Mychart

## 2024-06-17 NOTE — Telephone Encounter (Signed)
 Done [facility]

## 2024-06-18 NOTE — Telephone Encounter (Signed)
 I spoke with the patiwent and scheduled her for surgery on 07/28/24.

## 2024-06-24 ENCOUNTER — Telehealth (HOSPITAL_BASED_OUTPATIENT_CLINIC_OR_DEPARTMENT_OTHER): Payer: Self-pay | Admitting: Orthopaedic Surgery

## 2024-06-24 NOTE — Telephone Encounter (Signed)
 I dont see where you called in tramadol ? Please advise

## 2024-06-24 NOTE — Telephone Encounter (Signed)
 Patient would like tramadol  transferred to [facility]  in [place]  [address] suit a [place] because she has moved

## 2024-06-25 ENCOUNTER — Other Ambulatory Visit (HOSPITAL_BASED_OUTPATIENT_CLINIC_OR_DEPARTMENT_OTHER): Payer: Self-pay | Admitting: Orthopaedic Surgery

## 2024-06-25 ENCOUNTER — Other Ambulatory Visit: Payer: Self-pay | Admitting: Orthopaedic Surgery

## 2024-06-25 DIAGNOSIS — M7502 Adhesive capsulitis of left shoulder: Secondary | ICD-10-CM

## 2024-06-25 MED ORDER — METHOCARBAMOL 500 MG PO TABS: 500.0000 mg | ORAL_TABLET | Freq: Four times a day (QID) | ORAL | 4 refills | Status: AC

## 2024-06-27 ENCOUNTER — Other Ambulatory Visit: Payer: Self-pay | Admitting: Nurse Practitioner

## 2024-07-04 ENCOUNTER — Telehealth (HOSPITAL_BASED_OUTPATIENT_CLINIC_OR_DEPARTMENT_OTHER): Payer: Self-pay | Admitting: Physical Therapy

## 2024-07-04 ENCOUNTER — Ambulatory Visit: Payer: Self-pay | Admitting: Nurse Practitioner

## 2024-07-04 NOTE — Telephone Encounter (Signed)
 LVM regarding referral for post-op PT. Requested call back for preferred location.  Angila Wombles C. Jannessa Ogden PT, DPT 07/04/2024 9:21 AM

## 2024-07-18 ENCOUNTER — Encounter (HOSPITAL_BASED_OUTPATIENT_CLINIC_OR_DEPARTMENT_OTHER): Payer: Self-pay | Admitting: Orthopaedic Surgery

## 2024-07-18 ENCOUNTER — Other Ambulatory Visit: Payer: Self-pay

## 2024-07-25 ENCOUNTER — Encounter (HOSPITAL_BASED_OUTPATIENT_CLINIC_OR_DEPARTMENT_OTHER)
Admission: RE | Admit: 2024-07-25 | Discharge: 2024-07-25 | Disposition: A | Discharge status: Home / Self Care | Source: Ambulatory Visit | Attending: Orthopaedic Surgery | Admitting: Orthopaedic Surgery

## 2024-07-25 DIAGNOSIS — J45909 Unspecified asthma, uncomplicated: Secondary | ICD-10-CM | POA: Diagnosis not present

## 2024-07-25 DIAGNOSIS — M7502 Adhesive capsulitis of left shoulder: Secondary | ICD-10-CM | POA: Diagnosis not present

## 2024-07-25 DIAGNOSIS — M7522 Bicipital tendinitis, left shoulder: Secondary | ICD-10-CM | POA: Diagnosis not present

## 2024-07-25 DIAGNOSIS — Z86711 Personal history of pulmonary embolism: Secondary | ICD-10-CM | POA: Diagnosis not present

## 2024-07-25 DIAGNOSIS — Z7901 Long term (current) use of anticoagulants: Secondary | ICD-10-CM | POA: Diagnosis not present

## 2024-07-25 DIAGNOSIS — E119 Type 2 diabetes mellitus without complications: Secondary | ICD-10-CM | POA: Diagnosis not present

## 2024-07-25 DIAGNOSIS — Z86718 Personal history of other venous thrombosis and embolism: Secondary | ICD-10-CM | POA: Diagnosis not present

## 2024-07-25 DIAGNOSIS — Z87891 Personal history of nicotine dependence: Secondary | ICD-10-CM | POA: Diagnosis not present

## 2024-07-25 DIAGNOSIS — Z7985 Long-term (current) use of injectable non-insulin antidiabetic drugs: Secondary | ICD-10-CM | POA: Diagnosis not present

## 2024-07-25 DIAGNOSIS — Z7984 Long term (current) use of oral hypoglycemic drugs: Secondary | ICD-10-CM | POA: Diagnosis not present

## 2024-07-25 DIAGNOSIS — M7542 Impingement syndrome of left shoulder: Secondary | ICD-10-CM | POA: Diagnosis present

## 2024-07-25 LAB — BASIC METABOLIC PANEL WITH GFR
Anion gap: 6 (ref 5–15)
BUN: 10 mg/dL (ref 6–20)
CO2: 27 mmol/L (ref 22–32)
Calcium: 8.8 mg/dL — ABNORMAL LOW (ref 8.9–10.3)
Chloride: 108 mmol/L (ref 98–111)
Creatinine, Ser: 0.85 mg/dL (ref 0.44–1.00)
GFR, Estimated: >60 mL/min
Glucose, Bld: 113 mg/dL — ABNORMAL HIGH (ref 70–99)
Potassium: 4.3 mmol/L (ref 3.5–5.1)
Sodium: 141 mmol/L (ref 135–145)

## 2024-07-28 ENCOUNTER — Ambulatory Visit (HOSPITAL_BASED_OUTPATIENT_CLINIC_OR_DEPARTMENT_OTHER)

## 2024-07-28 ENCOUNTER — Encounter (HOSPITAL_BASED_OUTPATIENT_CLINIC_OR_DEPARTMENT_OTHER): Payer: Self-pay | Admitting: Orthopaedic Surgery

## 2024-07-28 ENCOUNTER — Other Ambulatory Visit: Payer: Self-pay

## 2024-07-28 ENCOUNTER — Encounter (HOSPITAL_BASED_OUTPATIENT_CLINIC_OR_DEPARTMENT_OTHER)
Admission: RE | Disposition: A | Discharge status: Home / Self Care | Payer: Self-pay | Source: Home / Self Care | Attending: Orthopaedic Surgery

## 2024-07-28 ENCOUNTER — Ambulatory Visit (HOSPITAL_BASED_OUTPATIENT_CLINIC_OR_DEPARTMENT_OTHER)
Admission: RE | Admit: 2024-07-28 | Discharge: 2024-07-28 | Disposition: A | Discharge status: Home / Self Care | Attending: Orthopaedic Surgery | Admitting: Orthopaedic Surgery

## 2024-07-28 DIAGNOSIS — M7542 Impingement syndrome of left shoulder: Secondary | ICD-10-CM | POA: Diagnosis not present

## 2024-07-28 DIAGNOSIS — E119 Type 2 diabetes mellitus without complications: Secondary | ICD-10-CM

## 2024-07-28 DIAGNOSIS — J45909 Unspecified asthma, uncomplicated: Secondary | ICD-10-CM | POA: Diagnosis not present

## 2024-07-28 DIAGNOSIS — Z7901 Long term (current) use of anticoagulants: Secondary | ICD-10-CM | POA: Insufficient documentation

## 2024-07-28 DIAGNOSIS — M7502 Adhesive capsulitis of left shoulder: Secondary | ICD-10-CM | POA: Insufficient documentation

## 2024-07-28 DIAGNOSIS — Z7984 Long term (current) use of oral hypoglycemic drugs: Secondary | ICD-10-CM | POA: Insufficient documentation

## 2024-07-28 DIAGNOSIS — Z86718 Personal history of other venous thrombosis and embolism: Secondary | ICD-10-CM | POA: Insufficient documentation

## 2024-07-28 DIAGNOSIS — Z01818 Encounter for other preprocedural examination: Secondary | ICD-10-CM

## 2024-07-28 DIAGNOSIS — Z87891 Personal history of nicotine dependence: Secondary | ICD-10-CM

## 2024-07-28 DIAGNOSIS — M7582 Other shoulder lesions, left shoulder: Secondary | ICD-10-CM

## 2024-07-28 DIAGNOSIS — Z7985 Long-term (current) use of injectable non-insulin antidiabetic drugs: Secondary | ICD-10-CM | POA: Insufficient documentation

## 2024-07-28 DIAGNOSIS — Z86711 Personal history of pulmonary embolism: Secondary | ICD-10-CM | POA: Insufficient documentation

## 2024-07-28 DIAGNOSIS — M7522 Bicipital tendinitis, left shoulder: Secondary | ICD-10-CM | POA: Diagnosis not present

## 2024-07-28 HISTORY — PX: BICEPT TENODESIS: SHX5116

## 2024-07-28 LAB — GLUCOSE, CAPILLARY
Glucose-Capillary: 108 mg/dL — ABNORMAL HIGH (ref 70–99)
Glucose-Capillary: 93 mg/dL (ref 70–99)

## 2024-07-28 LAB — POCT PREGNANCY, URINE: Preg Test, Ur: NEGATIVE

## 2024-07-28 SURGERY — ARTHROSCOPY, SHOULDER WITH DEBRIDEMENT
Anesthesia: General | Site: Shoulder | Laterality: Left

## 2024-07-28 MED ORDER — BUPIVACAINE-EPINEPHRINE (PF) 0.5% -1:200000 IJ SOLN: INTRAMUSCULAR | Status: DC | PRN

## 2024-07-28 MED ORDER — PROPOFOL 10 MG/ML IV BOLUS
INTRAVENOUS | Status: DC | PRN
  Administered 2024-07-28: 150 mg via INTRAVENOUS

## 2024-07-28 MED ORDER — MIDAZOLAM HCL 2 MG/2ML IJ SOLN
INTRAMUSCULAR | Status: AC
  Filled 2024-07-28: qty 2

## 2024-07-28 MED ORDER — ASPIRIN 325 MG PO TBEC: 325.0000 mg | DELAYED_RELEASE_TABLET | Freq: Every day | ORAL | 0 refills | Status: AC

## 2024-07-28 MED ORDER — ACETAMINOPHEN 500 MG PO TABS: 500.0000 mg | ORAL_TABLET | Freq: Three times a day (TID) | ORAL | 0 refills | Status: AC

## 2024-07-28 MED ORDER — PROPOFOL 10 MG/ML IV BOLUS
INTRAVENOUS | Status: AC
  Filled 2024-07-28: qty 20

## 2024-07-28 MED ORDER — SODIUM CHLORIDE 0.9 % IR SOLN
Status: DC | PRN
  Administered 2024-07-28: 6000 mL

## 2024-07-28 MED ORDER — FENTANYL CITRATE (PF) 100 MCG/2ML IJ SOLN
INTRAMUSCULAR | Status: DC | PRN
  Administered 2024-07-28: 50 ug via INTRAVENOUS

## 2024-07-28 MED ORDER — FENTANYL CITRATE (PF) 100 MCG/2ML IJ SOLN
INTRAMUSCULAR | Status: AC
  Filled 2024-07-28: qty 2

## 2024-07-28 MED ORDER — PHENYLEPHRINE HCL (PRESSORS) 10 MG/ML IV SOLN
INTRAVENOUS | Status: AC
  Filled 2024-07-28: qty 1

## 2024-07-28 MED ORDER — LIDOCAINE 2% (20 MG/ML) 5 ML SYRINGE
INTRAMUSCULAR | Status: DC | PRN
  Administered 2024-07-28: 100 mg via INTRAVENOUS

## 2024-07-28 MED ORDER — ROCURONIUM BROMIDE 10 MG/ML (PF) SYRINGE
PREFILLED_SYRINGE | INTRAVENOUS | Status: AC
  Filled 2024-07-28: qty 10

## 2024-07-28 MED ORDER — LACTATED RINGERS IV SOLN: INTRAVENOUS | Status: DC

## 2024-07-28 MED ORDER — CEFAZOLIN SODIUM-DEXTROSE 2-3 GM-%(50ML) IV SOLR
INTRAVENOUS | Status: DC | PRN
  Administered 2024-07-28: 2 g via INTRAVENOUS

## 2024-07-28 MED ORDER — BUPIVACAINE LIPOSOME 1.3 % IJ SUSP
INTRAMUSCULAR | Status: DC | PRN
  Administered 2024-07-28: 10 mL via PERINEURAL

## 2024-07-28 MED ORDER — FENTANYL CITRATE (PF) 100 MCG/2ML IJ SOLN
100.0000 ug | Freq: Once | INTRAMUSCULAR | Status: AC
  Administered 2024-07-28: 100 ug via INTRAVENOUS

## 2024-07-28 MED ORDER — MIDAZOLAM HCL (PF) 2 MG/2ML IJ SOLN
2.0000 mg | Freq: Once | INTRAMUSCULAR | Status: AC
  Administered 2024-07-28: 2 mg via INTRAVENOUS

## 2024-07-28 MED ORDER — IBUPROFEN 800 MG PO TABS: 800.0000 mg | ORAL_TABLET | Freq: Three times a day (TID) | ORAL | 0 refills | Status: AC

## 2024-07-28 MED ORDER — ROCURONIUM BROMIDE 10 MG/ML (PF) SYRINGE
PREFILLED_SYRINGE | INTRAVENOUS | Status: DC | PRN
  Administered 2024-07-28: 70 mg via INTRAVENOUS

## 2024-07-28 MED ORDER — BUPIVACAINE-EPINEPHRINE (PF) 0.5% -1:200000 IJ SOLN
INTRAMUSCULAR | Status: DC | PRN
  Administered 2024-07-28: 10 mL via PERINEURAL

## 2024-07-28 MED ORDER — LIDOCAINE 2% (20 MG/ML) 5 ML SYRINGE
INTRAMUSCULAR | Status: AC
  Filled 2024-07-28: qty 5

## 2024-07-28 MED ORDER — OXYCODONE HCL 5 MG PO TABS: 5.0000 mg | ORAL_TABLET | ORAL | 0 refills | Status: AC | PRN

## 2024-07-28 MED ORDER — PHENYLEPHRINE 80 MCG/ML (10ML) SYRINGE FOR IV PUSH (FOR BLOOD PRESSURE SUPPORT)
PREFILLED_SYRINGE | INTRAVENOUS | Status: DC | PRN
  Administered 2024-07-28: 80 ug via INTRAVENOUS
  Administered 2024-07-28: 160 ug via INTRAVENOUS

## 2024-07-28 MED ORDER — PHENYLEPHRINE HCL-NACL 20-0.9 MG/250ML-% IV SOLN
INTRAVENOUS | Status: DC | PRN
  Administered 2024-07-28: 40 ug/min via INTRAVENOUS

## 2024-07-28 MED ORDER — DEXAMETHASONE SOD PHOSPHATE PF 10 MG/ML IJ SOLN
INTRAMUSCULAR | Status: DC | PRN
  Administered 2024-07-28: 5 mg via INTRAVENOUS

## 2024-07-28 MED ORDER — SUGAMMADEX SODIUM 200 MG/2ML IV SOLN
INTRAVENOUS | Status: DC | PRN
  Administered 2024-07-28: 250 mg via INTRAVENOUS

## 2024-07-28 MED ORDER — ONDANSETRON HCL 4 MG/2ML IJ SOLN
INTRAMUSCULAR | Status: DC | PRN
  Administered 2024-07-28: 4 mg via INTRAVENOUS

## 2024-07-28 SURGICAL SUPPLY — 63 items
ANCHOR QUATTRO LINK KNTLS 2.9 (Anchor) IMPLANT
BENZOIN TINCTURE PRP APPL 2/3 (GAUZE/BANDAGES/DRESSINGS) IMPLANT
BLADE EXCALIBUR 4.0X13 (MISCELLANEOUS) ×2 IMPLANT
BLADE SURG 15 STRL LF DISP TIS (BLADE) IMPLANT
BURR OVAL 8 FLU 4.0X13 (MISCELLANEOUS) ×2 IMPLANT
CANN ARTH HYDRODAM HIP 8.5X75 (CANNULA) IMPLANT
CANNULA 5.75X71 LONG (CANNULA) IMPLANT
CANNULA PASSPORT 5 (CANNULA) IMPLANT
CANNULA PASSPORT BUTTON 10-40 (CANNULA) IMPLANT
CANNULA TWIST IN 8.25X7CM (CANNULA) IMPLANT
CHLORAPREP W/TINT 26 (MISCELLANEOUS) ×4 IMPLANT
CLSR STERI-STRIP ANTIMIC 1/2X4 (GAUZE/BANDAGES/DRESSINGS) IMPLANT
COOLER ICEMAN CLASSIC (MISCELLANEOUS) ×2 IMPLANT
DRAPE IMP U-DRAPE 54X76 (DRAPES) ×2 IMPLANT
DRAPE INCISE IOBAN 66X45 STRL (DRAPES) ×2 IMPLANT
DRAPE SHOULDER BEACH CHAIR (DRAPES) ×2 IMPLANT
DRAPE U-SHAPE 47X51 STRL (DRAPES) ×4 IMPLANT
DW OUTFLOW CASSETTE/TUBE SET (MISCELLANEOUS) ×2 IMPLANT
ELECTRODE REM PT RTRN 9FT ADLT (ELECTROSURGICAL) ×2 IMPLANT
GAUZE PAD ABD 8X10 STRL (GAUZE/BANDAGES/DRESSINGS) ×2 IMPLANT
GAUZE SPONGE 4X4 12PLY STRL (GAUZE/BANDAGES/DRESSINGS) ×2 IMPLANT
GAUZE XEROFORM 1X8 LF (GAUZE/BANDAGES/DRESSINGS) ×2 IMPLANT
GLOVE BIO SURGEON STRL SZ 6 (GLOVE) ×4 IMPLANT
GLOVE BIO SURGEON STRL SZ7.5 (GLOVE) ×4 IMPLANT
GLOVE BIOGEL PI IND STRL 6.5 (GLOVE) ×2 IMPLANT
GLOVE BIOGEL PI IND STRL 8 (GLOVE) ×2 IMPLANT
GOWN STRL REUS W/ TWL LRG LVL3 (GOWN DISPOSABLE) ×4 IMPLANT
GOWN STRL REUS W/ TWL XL LVL3 (GOWN DISPOSABLE) ×2 IMPLANT
GOWN STRL REUS W/TWL XL LVL3 (GOWN DISPOSABLE) ×2 IMPLANT
KIT STABILIZATION SHOULDER (MISCELLANEOUS) ×2 IMPLANT
KIT STR SPEAR 1.8 FBRTK DISP (KITS) IMPLANT
LASSO 90 CVE QUICKPAS (DISPOSABLE) IMPLANT
LASSO CRESCENT QUICKPASS (SUTURE) IMPLANT
LOOP 2 FIBERLINK CLOSED (SUTURE) IMPLANT
MANIFOLD NEPTUNE II (INSTRUMENTS) ×2 IMPLANT
NDL HD SCORPION MEGA LOADER (NEEDLE) IMPLANT
NEEDLE SUT PASSER RTC (NEEDLE) IMPLANT
PACK ARTHROSCOPY DSU (CUSTOM PROCEDURE TRAY) ×2 IMPLANT
PACK BASIN DAY SURGERY FS (CUSTOM PROCEDURE TRAY) ×2 IMPLANT
PAD COLD SHLDR WRAP-ON (PAD) ×2 IMPLANT
PENCIL SMOKE EVACUATOR (MISCELLANEOUS) IMPLANT
RESTRAINT HEAD UNIVERSAL NS (MISCELLANEOUS) ×2 IMPLANT
SHEET MEDIUM DRAPE 40X70 STRL (DRAPES) ×2 IMPLANT
SLEEVE SCD COMPRESS KNEE MED (STOCKING) ×2 IMPLANT
SLING ARM FOAM STRAP LRG (SOFTGOODS) IMPLANT
SPONGE T-LAP 4X18 ~~LOC~~+RFID (SPONGE) ×2 IMPLANT
SUT BROADBAND TAPE 2PK 1.5 (SUTURE) IMPLANT
SUT ETHILON 3 0 PS 1 (SUTURE) ×2 IMPLANT
SUT MON AB 4-0 PS1 27 (SUTURE) IMPLANT
SUT PDS AB 1 CT 36 (SUTURE) IMPLANT
SUT TIGER TAPE 7 IN WHITE (SUTURE) IMPLANT
SUT VIC AB 0 CT1 27XBRD ANBCTR (SUTURE) IMPLANT
SUT VIC AB 0 CT2 27 (SUTURE) IMPLANT
SUT VIC AB 2-0 SH 27XBRD (SUTURE) IMPLANT
SUTURE FIBERWR #2 38 T-5 BLUE (SUTURE) IMPLANT
SUTURE TAPE 1.3 40 TPR END (SUTURE) IMPLANT
SUTURE TAPE TIGERLINK 1.3MM BL (SUTURE) IMPLANT
TAPE FIBER 2MM 7IN #2 BLUE (SUTURE) IMPLANT
TOWEL GREEN STERILE FF (TOWEL DISPOSABLE) ×4 IMPLANT
TUBE CONNECTING 20X1/4 (TUBING) ×2 IMPLANT
TUBING ARTHROSCOPY IRRIG 16FT (MISCELLANEOUS) ×2 IMPLANT
WAND ABLATOR APOLLO I90 (BUR) ×2 IMPLANT
YANKAUER SUCT BULB TIP NO VENT (SUCTIONS) IMPLANT

## 2024-07-28 NOTE — Anesthesia Procedure Notes (Signed)
 Procedure Name: Intubation Date/Time: 07/28/2024 2:38 PM  Performed by: Delayne Olam BIRCH, CRNAPre-anesthesia Checklist: Patient identified, Emergency Drugs available, Suction available and Patient being monitored Patient Re-evaluated:Patient Re-evaluated prior to induction Oxygen Delivery Method: Circle system utilized Preoxygenation: Pre-oxygenation with 100% oxygen Induction Type: IV induction Ventilation: Mask ventilation without difficulty Laryngoscope Size: Glidescope, Mac and 3 Grade View: Grade I Tube type: Oral Tube size: 7.0 mm Number of attempts: 1 Airway Equipment and Method: Stylet and Oral airway Placement Confirmation: ETT inserted through vocal cords under direct vision, positive ETCO2 and breath sounds checked- equal and bilateral Secured at: 22 cm Tube secured with: Tape Dental Injury: Teeth and Oropharynx as per pre-operative assessment

## 2024-07-28 NOTE — Op Note (Signed)
 "  Date of Surgery: 07/28/2024  INDICATIONS: Ms. Trevino is a 44 y.o.-year-old female with left shoulder impingment.  The risk and benefits of the procedure were discussed in detail and documented in the pre-operative evaluation.   PREOPERATIVE DIAGNOSES: Left shoulder, subacromial impingement and adhesive capsulitis, biceps tendonitis.  POSTOPERATIVE DIAGNOSIS: Same.  PROCEDURE: Arthroscopic limited debridement - 70177 Arthroscopic subacromial decompression - 70173 Arthroscopic biceps tenodesis - 70171  SURGEON: Elspeth LITTIE Parker MD  ASSISTANT: Conley Funk, ATC  ANESTHESIA:  general  IV FLUIDS AND URINE: See anesthesia record.  ANTIBIOTICS: Ancef   ESTIMATED BLOOD LOSS: 5 mL.  IMPLANTS:  Implant Name Type Inv. Item Serial No. Manufacturer Lot No. LRB No. Used Action  ANCHOR QUATTRO LINK KNTLS 2.9 - [id number] Anchor ANCHOR QUATTRO LINK KNTLS 2.9  [employer](ORTH,TRAU,BIO,SG) [id number] Left 1 Implanted    DRAINS: None  CULTURES: None  COMPLICATIONS: none  PROCEDURE:    OPERATIVE FINDING: Exam under anesthesia:   Examination under anesthesia revealed forward elevation of 150 degrees.  With the arm at the side, there was 65 degrees of external rotation.  There is a 1+ anterior load shift and a 1+ posterior load shift.    Arthroscopic findings demonstrated: Articular space: Rotator interval red/inflamed Chondral surfaces: Normal Biceps: Insertional redness/fraying at labral insertion Subscapularis: Intact Supraspinatus: Intact Infraspinatus: Intact    I identified the patient in the pre-operative holding area.  I marked the operative right shoulder with my initials. I reviewed the risks and benefits of the proposed surgical intervention and the patient wished to proceed.  Anesthesia was then performed with regional block.  The patient was transferred to the operative suite and placed in the beach chair position with all bony prominences padded.     SCDs were placed on  bilateral lower extremity. Appropriate antibiotics was administered within 1 hour before incision.  Anesthesia was induced.  The operative extremity was then prepped and draped in standard fashion. A time out was performed confirming the correct extremity, correct patient and correct procedure.   The arthroscope was introduced in the glenohumeral joint from a posterior portal.  An anterior portal was created.  The shoulder was examined and the above findings were noted.     With an arthroscopic shaver and a wand ablator, synovitis throughout the  shoulder was resected.  The arthroscopic shaver was used to excise torn portions of the labrum back to a stable margin. Specifically this was done for the anterior and superior labrum. The rotator interval was debrided using shaver and electrocautery.  At this time the biceps was tagged with a self passing device with a #2 nonabsorbable suture twice.  This was done through the anterior portal.  This was placed in a single 2.9 Quattro link anchor.     The rotator cuff was then approached through the subacromial space. Anterior, lateral, and posterior portals were used.  Bursectomy was performed with an arthroscopic shaver and [employer]throCare wand.  The soft tissues and 1 mm of bone on the undersurface of the acromion and clavicle were resected with the arthroscopic shaver and wand.   The shoulder was irrigated.  The arthroscopic instruments were removed.  Wounds were closed with 3-0 nylon sutures.  A sterile dressing was applied with xeroform, 4x8s, abdominal pad, and tape. An Rosalita was placed and the upper extremity was placed in a shoulder immobilizer.  The patient tolerated the procedure well and was taken to the recovery room in stable condition.  All counts were correct in the case.  The patient tolerated the procedure well and was taken to the recovery room in stable condition.    POSTOPERATIVE PLAN: She will follow the biceps tenodesis protocol. She will be  placed on aspirin  for DVT prophylaxis. She will be seen by PT postop.  Elspeth LITTIE Parker, MD 3:39 PM  "

## 2024-07-28 NOTE — Discharge Instructions (Addendum)
 "    Discharge Instructions    Attending Surgeon: Elspeth Parker, MD Office Phone Number: [phone]   Diagnosis and Procedures:    Surgeries Performed: Left shoulder debridement, biceps tenodesis  Discharge Plan:    Diet: Resume usual diet. Begin with light or bland foods.  Drink plenty of fluids.  Activity:  Keep sling and dressing in place until your follow up visit in Physical Therapy You are advised to go home directly from the hospital or surgical center. Restrict your activities.  GENERAL INSTRUCTIONS: 1.  Keep your surgical site elevated above your heart for at least 5-7 days or longer to prevent swelling. This will improve your comfort and your overall recovery following surgery.     2. Please call Dr. Danetta office at [phone] with questions Monday-Friday during business hours. If no one answers, please leave a message and someone should get back to the patient within 24 hours. For emergencies please call 911 or proceed to the emergency room.   3. Patient to notify surgical team if experiences any of the following: Bowel/Bladder dysfunction, uncontrolled pain, nerve/muscle weakness, incision with increased drainage or redness, nausea/vomiting and Fever greater than 101.0 F.  Be alert for signs of infection including redness, streaking, odor, fever or chills. Be alert for excessive pain or bleeding and notify your surgeon immediately.  WOUND INSTRUCTIONS:   Leave your dressing/cast/splint in place until your post operative visit.  Keep it clean and dry.  Always keep the incision clean and dry until the staples/sutures are removed. If there is no drainage from the incision you should keep it open to air. If there is drainage from the incision you must keep it covered at all times until the drainage stops  Do not soak in a bath tub, hot tub, pool, lake or other body of water until 21 days after your surgery and your incision is completely dry and healed.  If you  have removable sutures (or staples) they must be removed 10-14 days (unless otherwise instructed) from the day of your surgery.     1)  Elevate the extremity as much as possible.  2)  Keep the dressing clean and dry.  3)  Please call us  if the dressing becomes wet or dirty.  4)  If you are experiencing worsening pain or worsening swelling, please call.     MEDICATIONS: Resume all previous home medications at the previous prescribed dose and frequency unless otherwise noted Start taking the  pain medications on an as-needed basis as prescribed  Please taper down pain medication over the next week following surgery.  Ideally you should not require a refill of any narcotic pain medication.  Take pain medication with food to minimize nausea. In addition to the prescribed pain medication, you may take over-the-counter pain relievers such as Tylenol .  Do NOT take additional tylenol  if your pain medication already has tylenol  in it.  Aspirin  325mg  daily per bottle instructions. Narcotic Policy: Per [facility] clinic policy, our goal is ensure optimal postoperative pain control with a multimodal pain management strategy. For all [facility] patients, our goal is to wean post-operative narcotic medications by 6 weeks post-operatively, and many times sooner. If this is not possible due to utilization of pain medication prior to surgery, your [facility] doctor will support your acute post-operative pain control for the first 6 weeks postoperatively, with a plan to transition you back to your primary pain team following that. Maralee will work to ensure a therapist, occupational.  FOLLOWUP INSTRUCTIONS: 1. Follow up at the Physical Therapy Clinic 3-4 days following surgery. This appointment should be scheduled unless other arrangements have been made.The Physical Therapy scheduling number is [phone] if an appointment has not already been arranged.  2. Contact Dr. Danetta office during office hours at  [phone] or the practice after hours line at [phone] for non-emergencies. For medical emergencies call 911.   Discharge Location: Home   Post Anesthesia Home Care Instructions  Activity: Get plenty of rest for the remainder of the day. A responsible individual must stay with you for 24 hours following the procedure.  For the next 24 hours, DO NOT: -Drive a car -Advertising copywriter -Drink alcoholic beverages -Take any medication unless instructed by your physician -Make any legal decisions or sign important papers.  Meals: Start with liquid foods such as gelatin or soup. Progress to regular foods as tolerated. Avoid greasy, spicy, heavy foods. If nausea and/or vomiting occur, drink only clear liquids until the nausea and/or vomiting subsides. Call your physician if vomiting continues.  Special Instructions/Symptoms: Your throat may feel dry or sore from the anesthesia or the breathing tube placed in your throat during surgery. If this causes discomfort, gargle with warm salt water. The discomfort should disappear within 24 hours.  Regional Anesthesia Blocks  1. You may not be able to move or feel the blocked extremity after a regional anesthetic block. This may last may last from 3-48 hours after placement, but it will go away. The length of time depends on the medication injected and your individual response to the medication. As the nerves start to wake up, you may experience tingling as the movement and feeling returns to your extremity. If the numbness and inability to move your extremity has not gone away after 48 hours, please call your surgeon.   2. The extremity that is blocked will need to be protected until the numbness is gone and the strength has returned. Because you cannot feel it, you will need to take extra care to avoid injury. Because it may be weak, you may have difficulty moving it or using it. You may not know what position it is in without looking at it  while the block is in effect.  3. For blocks in the legs and feet, returning to weight bearing and walking needs to be done carefully. You will need to wait until the numbness is entirely gone and the strength has returned. You should be able to move your leg and foot normally before you try and bear weight or walk. You will need someone to be with you when you first try to ensure you do not fall and possibly risk injury.  4. Bruising and tenderness at the needle site are common side effects and will resolve in a few days.  5. Persistent numbness or new problems with movement should be communicated to the surgeon or the [facility] [phone] [facility] [phone]).  Information for Discharge Teaching: EXPAREL  (bupivacaine  liposome injectable suspension)   Pain relief is important to your recovery. The goal is to control your pain so you can move easier and return to your normal activities as soon as possible after your procedure. Your physician may use several types of medicines to manage pain, swelling, and more.  Your surgeon or anesthesiologist gave you EXPAREL (bupivacaine ) to help control your pain after surgery.  EXPAREL  is a local anesthetic designed to release slowly over an extended period of time to provide pain  relief by numbing the tissue around the surgical site. EXPAREL  is designed to release pain medication over time and can control pain for up to 72 hours. Depending on how you respond to EXPAREL , you may require less pain medication during your recovery. EXPAREL  can help reduce or eliminate the need for opioids during the first few days after surgery when pain relief is needed the most. EXPAREL  is not an opioid and is not addictive. It does not cause sleepiness or sedation.   Important! A teal colored band has been placed on your arm with the date, time and amount of EXPAREL  you have received. Please leave this armband in place for the full 96  hours following administration, and then you may remove the band. If you return to the hospital for any reason within 96 hours following the administration of EXPAREL , the armband provides important information that your health care providers to know, and alerts them that you have received this anesthetic.    Possible side effects of EXPAREL : Temporary loss of sensation or ability to move in the area where medication was injected. Nausea, vomiting, constipation Rarely, numbness and tingling in your mouth or lips, lightheadedness, or anxiety may occur. Call your doctor right away if you think you may be experiencing any of these sensations, or if you have other questions regarding possible side effects.  Follow all other discharge instructions given to you by your surgeon or nurse. Eat a healthy diet and drink plenty of water or other fluids.    "

## 2024-07-28 NOTE — Anesthesia Procedure Notes (Signed)
 Anesthesia Regional Block: Interscalene brachial plexus block   Pre-Anesthetic Checklist: , timeout performed,  Correct Patient, Correct Site, Correct Laterality,  Correct Procedure, Correct Position, site marked,  Risks and benefits discussed,  At surgeon's request and post-op pain management  Laterality: Upper and Left  Prep: chloraprep       Needles:  Injection technique: Single-shot  Needle Type: Echogenic Stimulator Needle     Needle Length: 9cm  Needle Gauge: 20     Additional Needles:   Procedures:,,,, ultrasound used (permanent image in chart),, #20gu IV placed    Narrative:  Start time: 07/28/2024 1:55 PM End time: 07/28/2024 1:55 PM Injection made incrementally with aspirations every 5 mL.  Performed by: Personally

## 2024-07-28 NOTE — Progress Notes (Signed)
 Assisted Dr. CANDIE Mcbride with left, interscalene , ultrasound guided block. Side rails up, monitors on throughout procedure. See vital signs in flow sheet. Tolerated Procedure well.

## 2024-07-28 NOTE — Transfer of Care (Signed)
 Immediate Anesthesia Transfer of Care Note  Patient: Shannon Oneal  Procedure(s) Performed: Procedures (LRB): ARTHROSCOPY, SHOULDER WITH DEBRIDEMENT (Left) TENODESIS, BICEPS  Patient Location: PACU  Anesthesia Type: General  Level of Consciousness: awake, oriented, sedated and patient cooperative  Airway & Oxygen Therapy: Patient Spontanous Breathing and Patient connected to face mask oxygen  Post-op Assessment: Report given to PACU RN and Post -op Vital signs reviewed and stable  Post vital signs: Reviewed and stable  Complications: No apparent anesthesia complications Last Vitals:  Vitals Value Taken Time  BP 135/98 07/28/24 15:53  Temp 36.3 C 07/28/24 15:53  Pulse 101 07/28/24 15:55  Resp 4 07/28/24 15:55  SpO2 100 % 07/28/24 15:55  Vitals shown include unfiled device data.  Last Pain:  Vitals:   07/28/24 1114  TempSrc: Temporal  PainSc: 7       Patients Stated Pain Goal: 3 (07/28/24 1114)  Complications: No notable events documented.

## 2024-07-28 NOTE — Anesthesia Preprocedure Evaluation (Addendum)
"                                    Anesthesia Evaluation  Patient identified by MRN, date of birth, ID band Patient awake    Reviewed: Allergy & Precautions, NPO status , Patient's Chart, lab work & pertinent test results  History of Anesthesia Complications Negative for: history of anesthetic complications  Airway Mallampati: III  TM Distance: >3 FB Neck ROM: Full    Dental  (+) Teeth Intact, Dental Advisory Given   Pulmonary asthma , former smoker, PE   breath sounds clear to auscultation       Cardiovascular + DVT (on Eliquis  (last dose 07/18/24))   Rhythm:Regular Rate:Normal   EKG (2026): NSR  TTE (2022): 1. Left ventricular ejection fraction, by estimation, is 55 to 60%. Left  ventricular ejection fraction by 3D volume is 58 %. The left ventricle has  normal function. The left ventricle has no regional wall motion  abnormalities. There is mild left  ventricular hypertrophy. Left ventricular diastolic parameters were  normal.   2. Right ventricular systolic function is normal. The right ventricular  size is normal. Tricuspid regurgitation signal is inadequate for assessing  PA pressure.   3. The mitral valve is normal in structure. Trivial mitral valve  regurgitation. No evidence of mitral stenosis.   4. The aortic valve is normal in structure. Aortic valve regurgitation is  not visualized. No aortic stenosis is present.   5. The inferior vena cava is normal in size with greater than 50%  respiratory variability, suggesting right atrial pressure of 3 mmHg.     Neuro/Psych    GI/Hepatic   Endo/Other  diabetes, Type 2    GLP1-agonist (last dose 07/10/24)    Renal/GU Renal disease     Musculoskeletal   Abdominal   Peds  Hematology   Anesthesia Other Findings   Reproductive/Obstetrics                              Anesthesia Physical Anesthesia Plan  ASA: 3  Anesthesia Plan: General   Post-op Pain  Management: Regional block*   Induction: Intravenous  PONV Risk Score and Plan: 3 and Ondansetron , Dexamethasone , Treatment may vary due to age or medical condition and Midazolam   Airway Management Planned: Oral ETT and Video Laryngoscope Planned  Additional Equipment: None  Intra-op Plan:   Post-operative Plan: Extubation in OR  Informed Consent: I have reviewed the patients History and Physical, chart, labs and discussed the procedure including the risks, benefits and alternatives for the proposed anesthesia with the patient or authorized representative who has indicated his/her understanding and acceptance.     Dental advisory given  Plan Discussed with: CRNA and Surgeon  Anesthesia Plan Comments:          Anesthesia Quick Evaluation  "

## 2024-07-28 NOTE — Interval H&P Note (Signed)
 History and Physical Interval Note:  07/28/2024 11:59 AM  Shannon Oneal  has presented today for surgery, with the diagnosis of LEFT SHOULDER ROTATOR CUFF IMPINGEMENT.  The various methods of treatment have been discussed with the patient and family. After consideration of risks, benefits and other options for treatment, the patient has consented to  Procedures with comments: ARTHROSCOPY, SHOULDER WITH DEBRIDEMENT (Left) - LEFT SHOULDER ARTHROSCOPY WITH EXTENSIVE DEBRIDEMENT / ACROMIOPLASTY AND COLLAGEN PATCH AUGMENTATION as a surgical intervention.  The patient's history has been reviewed, patient examined, no change in status, stable for surgery.  I have reviewed the patient's chart and labs.  Questions were answered to the patient's satisfaction.     Rodrecus Belsky

## 2024-07-28 NOTE — Brief Op Note (Signed)
" ° °  Brief Op Note  Date of Surgery: 07/28/2024  Preoperative Diagnosis: LEFT SHOULDER ROTATOR CUFF IMPINGEMENT  Postoperative Diagnosis: same  Procedure: Procedures: ARTHROSCOPY, SHOULDER WITH DEBRIDEMENT TENODESIS, BICEPS  Implants: Implant Name Type Inv. Item Serial No. Manufacturer Lot No. LRB No. Used Action  ANCHOR QUATTRO LINK KNTLS 2.9 - [id number] Anchor ANCHOR LEIGHTON HIGASHI KNTLS 2.9  [employer](ORTH,TRAU,BIO,SG) [id number] Left 1 Implanted    Surgeons: Surgeon(s): Genelle Standing, MD  Anesthesia: General    Estimated Blood Loss: See anesthesia record  Complications: None  Condition to PACU: Stable  Standing LITTIE Genelle, MD 07/28/2024 3:39 PM  "

## 2024-07-28 NOTE — H&P (Signed)
 "        Chief Complaint: Left shoulder pain        History of Present Illness:    11/23/2023: Presents today for follow-up of her left shoulder.  She is still having persistent pain with very limited overhead range of motion or reaching for anything     01/12/23: Patient presents today for follow-up evaluation of left adhesive capsulitis.  We did perform a cortisone injection of the left glenohumeral joint 2 weeks ago at the last visit.  She does note significant improvement with overall less pain and improved range of motion.  States that she feels 60% better at this point.  Does note continued soreness of the shoulder in the morning however this does improve later in the day.  Not currently taking any pain medications.     Shannon Oneal is a 44 y.o. female presents today for evaluation of left shoulder pain after she was referred by her PCP yesterday.  Her pain started a few weeks ago without any known injury or cause.  She has moderate amounts of pain located in the shoulder joint as well as the upper back between the neck and shoulder.  Her range of motion has been very limited and inhibits her from performing normal daily activities.  She has had trouble sleeping at night.  She has tried lidocaine  patches and received a Toradol  injection yesterday that she hasn't felt improvement from yet.  Denies and numbness or tingling in the shoulder or down the arm.  She is a type 2 diabetic and is taking Ozempic .  Last A1C one month ago was 5.6.  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     Surgical History:   None   PMH/PSH/Family History/Social History/Meds/Allergies:            Past Medical History:  Diagnosis Date   Acute deep vein thrombosis (DVT) of right lower extremity (HCC) 01/18/2021   Acute pulmonary embolism (HCC) 01/19/2021   Asthma     Cellulitis in diabetic foot (HCC)     Diabetes mellitus without complication (HCC)     DVT (deep venous thrombosis) (HCC) 02/05/2021    DVT (deep venous thrombosis) (HCC)     PE (pulmonary thromboembolism) (HCC) 2016                  Past Surgical History:  Procedure Laterality Date   adenoids       CESAREAN SECTION             Social History             Socioeconomic History   Marital status: Married      Spouse name: Not on file   Number of children: Not on file   Years of education: Not on file   Highest education level: Some college, no degree  Occupational History   Not on file  Tobacco Use   Smoking status: Never   Smokeless tobacco: Never  Vaping Use   Vaping Use: Former   Substances: CBD  Substance and Sexual Activity   Alcohol use: Yes   Drug use: Never   Sexual activity: Not on file  Other Topics Concern   Not on file  Social History Narrative    Are you right handed or left handed? Right     Are you currently employed ? No    What is your current occupation?    Do you live at home alone?  Who lives with you? Husband, child and granddaughter    What type of home do you live in: 1 story or 2 story? 1         Social Determinants of Health           Financial Resource Strain: Medium Risk (11/30/2022)    Overall Financial Resource Strain (CARDIA)     Difficulty of Paying Living Expenses: Somewhat hard  Food Insecurity: Patient Declined (11/30/2022)    Hunger Vital Sign     Worried About Running Out of Food in the Last Year: Patient declined     Ran Out of Food in the Last Year: Patient declined  Transportation Needs: Unmet Transportation Needs (11/30/2022)    PRAPARE - Therapist, Art (Medical): Yes     Lack of Transportation (Non-Medical): Yes  Physical Activity: Insufficiently Active (11/30/2022)    Exercise Vital Sign     Days of Exercise per Week: 2 days     Minutes of Exercise per Session: 30 min  Stress: Stress Concern Present (11/30/2022)    [employer] of Occupational Health - Occupational Stress Questionnaire     Feeling of Stress : To some extent   Social Connections: Socially Integrated (11/30/2022)    Social Connection and Isolation Panel [NHANES]     Frequency of Communication with Friends and Family: More than three times a week     Frequency of Social Gatherings with Friends and Family: Patient declined     Attends Religious Services: 1 to 4 times per year     Active Member of [employer] or Organizations: Yes     Attends Banker Meetings: 1 to 4 times per year     Marital Status: Married             Family History  Problem Relation Age of Onset   Deep vein thrombosis Father     Breast cancer Sister 41         Allergies           Allergies  Allergen Reactions   Amoxapine And Related Other (See Comments)      Burns skin, hair falls out   Amoxicillin Other (See Comments)      Burns skin and hair falls out   Oxycodone  Itching   Percocet [Oxycodone -Acetaminophen ] Itching   Morphine  Itching and Other (See Comments)      Must receive Benadryl  to tolerate   Rocephin [Ceftriaxone] Swelling, Rash and Other (See Comments)      Swelling at site where received   Tramadol  Itching and Other (See Comments)      Must receive Benadryl  to tolerate                 Current Outpatient Medications  Medication Sig Dispense Refill   Accu-Chek Softclix Lancets lancets Use as instructed 100 each 12   albuterol  (VENTOLIN  HFA) 108 (90 Base) MCG/ACT inhaler Inhale 2 puffs into the lungs every 6 (six) hours as needed for wheezing or shortness of breath. 8.5 g 0   apixaban  (ELIQUIS ) 5 MG TABS tablet Take 1 tablet (5 mg total) by mouth 2 (two) times daily. 60 tablet 5   blood glucose meter kit and supplies KIT Dispense based on patient and insurance preference. Use up to four times daily as directed. 1 each 0   Erenumab -aooe (AIMOVIG ) 140 MG/ML SOAJ Inject 140 mg into the skin every 28 (twenty-eight) days. 1.12 mL 5   glucose blood (ACCU-CHEK  GUIDE) test strip Use as instructed 100 each 12   lidocaine  (LIDODERM ) 5 % Place 1 patch onto  the skin daily. Remove & Discard patch within 12 hours or as directed by MD 30 patch 0   metFORMIN  (GLUCOPHAGE -XR) 500 MG 24 hr tablet Take 2 tablets (1,000 mg total) by mouth 2 (two) times daily. 360 tablet 3   ondansetron  (ZOFRAN -ODT) 4 MG disintegrating tablet Take 1 tablet (4 mg total) by mouth every 8 (eight) hours as needed for nausea or vomiting. 20 tablet 5   Semaglutide , 1 MG/DOSE, (OZEMPIC , 1 MG/DOSE,) 4 MG/3ML SOPN Inject 1 mg as directed once a week. 3 mL 2      No current facility-administered medications for this visit.      Imaging Results (Last 48 hours)  No results found.      Review of Systems:   A ROS was performed including pertinent positives and negatives as documented in the HPI.   Physical Exam :   Constitutional: NAD and appears stated age Neurological: Alert and oriented Psych: Appropriate affect and cooperative There were no vitals taken for this visit.    Comprehensive Musculoskeletal Exam:       Musculoskeletal Exam      Inspection Right Left  Skin No atrophy or winging No atrophy or winging  Palpation      Tenderness None Anterior glenohumeral joint  Range of Motion      Flexion (passive) 160 130  Flexion (active) 160 120  ER at the side 50 30  Can reach behind back to L4 Back pocket  Strength        5/5 Limited due to pain  Special Tests      Pseudoparalytic No No  Neurologic      Fires PIN, radial, median, ulnar, musculocutaneous, axillary, suprascapular, long thoracic, and spinal accessory innervated muscles. No abnormal sensibility  Vascular/Lymphatic      Radial Pulse 2+ 2+  Cervical Exam      Patient has symmetric cervical range of motion with negative Spurling's test.  Special Test: N/A      Imaging:       MRI left shoulder: There is evidence of inferior glenohumeral thickening with intrasubstance tearing of the rotator cuff but no frank bursal articular sided tear   Assessment:   44 y.o. female with adhesive capsulitis of  the left shoulder.  At this time she has now had multiple injections without relief.  If anything this did somewhat aggravate and worsen her symptoms.  At this time I did discuss that based on her MRI I am not necessarily concerned for a full-thickness or even partial-thickness rotator cuff tear to that effect I do believe she would benefit from an acromioplasty with capsular release and manipulation.  I did discuss risks and benefits as well as limitations associated with this.   -Plan for left shoulder arthroscopy with capsular release and acromioplasty     After a lengthy discussion of treatment options, including risks, benefits, alternatives, complications of surgical and nonsurgical conservative options, the patient elected surgical repair.    The patient  is aware of the material risks  and complications including, but not limited to injury to adjacent structures, neurovascular injury, infection, numbness, bleeding, implant failure, thermal burns, stiffness, persistent pain, failure to heal, disease transmission from allograft, need for further surgery, dislocation, anesthetic risks, blood clots, risks of death,and others. The probabilities of surgical success and failure discussed with patient given their particular co-morbidities.The time and nature of  expected rehabilitation and recovery was discussed.The patient's questions were all answered preoperatively.  No barriers to understanding were noted. I explained the natural history of the disease process and Rx rationale.  I explained to the patient what I considered to be reasonable expectations given their personal situation.  The final treatment plan was arrived at through a shared patient decision making process model.              Electronically signed by Genelle Standing, MD at 11/23/2023  2:19 PM "

## 2024-07-29 ENCOUNTER — Encounter (HOSPITAL_BASED_OUTPATIENT_CLINIC_OR_DEPARTMENT_OTHER): Payer: Self-pay | Admitting: Orthopaedic Surgery

## 2024-07-29 NOTE — Anesthesia Postprocedure Evaluation (Signed)
"   Anesthesia Post Note  Patient: Jeanice Murdaugh  Procedure(s) Performed: ARTHROSCOPY, SHOULDER WITH DEBRIDEMENT (Left: Shoulder) TENODESIS, BICEPS (Shoulder)     Patient location during evaluation: PACU Anesthesia Type: General Level of consciousness: awake Pain management: pain level controlled Vital Signs Assessment: post-procedure vital signs reviewed and stable Respiratory status: spontaneous breathing Cardiovascular status: stable Postop Assessment: no apparent nausea or vomiting and adequate PO intake Anesthetic complications: no   No notable events documented.  Last Vitals:  Vitals:   07/28/24 1615 07/28/24 1634  BP: (!) 135/96 (!) 135/91  Pulse: (!) 106 (!) 108  Resp: 17 16  Temp:  36.5 C  SpO2: 96% 93%    Last Pain:  Vitals:   07/28/24 1634  TempSrc: Temporal  PainSc: 0-No pain                 Vincenzina Jagoda T Colhoun      "

## 2024-07-31 ENCOUNTER — Telehealth: Payer: Self-pay

## 2024-07-31 NOTE — Telephone Encounter (Signed)
 Patient called stating that her dressing is starting to curl up around the edges and would like to know what she needs to do to keep the wound covered.  Had left shoulder surgery on 07/29/2023.  CB# [phone]. Please advise.  Thank you.

## 2024-08-08 ENCOUNTER — Ambulatory Visit (HOSPITAL_BASED_OUTPATIENT_CLINIC_OR_DEPARTMENT_OTHER): Admitting: Orthopaedic Surgery

## 2024-08-08 ENCOUNTER — Other Ambulatory Visit (HOSPITAL_BASED_OUTPATIENT_CLINIC_OR_DEPARTMENT_OTHER): Payer: Self-pay

## 2024-08-08 DIAGNOSIS — M7502 Adhesive capsulitis of left shoulder: Secondary | ICD-10-CM

## 2024-08-08 DIAGNOSIS — M7522 Bicipital tendinitis, left shoulder: Secondary | ICD-10-CM

## 2024-08-08 MED ORDER — MELOXICAM 15 MG PO TABS
15.0000 mg | ORAL_TABLET | Freq: Every day | ORAL | 0 refills | Status: AC
  Filled 2024-08-08: qty 30, 30d supply, fill #0

## 2024-08-08 MED ORDER — OXYCODONE HCL 5 MG PO TABS
5.0000 mg | ORAL_TABLET | ORAL | 0 refills | Status: AC | PRN
  Filled 2024-08-08: qty 15, 3d supply, fill #0

## 2024-08-08 NOTE — Progress Notes (Signed)
 "                                Post Operative Evaluation    Procedure/Date of Surgery: Left shoulder biceps tenodesis collagen patch augmentation 1/5  Interval History:   Presents 2 weeks status post above procedure.  Overall doing well.  She is having a difficult time with passive range of motion.   PMH/PSH/Family History/Social History/Meds/Allergies:    Past Medical History:  Diagnosis Date   Acute deep vein thrombosis (DVT) of right lower extremity (HCC) 01/18/2021   Acute pulmonary embolism (HCC) 01/19/2021   Asthma    Cellulitis in diabetic foot (HCC)    Diabetes mellitus without complication (HCC)    DVT (deep venous thrombosis) (HCC) 02/05/2021   PE (pulmonary thromboembolism) (HCC) 2016   Past Surgical History:  Procedure Laterality Date   adenoids     BICEPT TENODESIS  07/28/2024   Procedure: TENODESIS, BICEPS;  Surgeon: Genelle Standing, MD;  Location: [facility];  Service: Orthopedics;;   CESAREAN SECTION     Social History   Socioeconomic History   Marital status: Married    Spouse name: Not on file   Number of children: Not on file   Years of education: Not on file   Highest education level: Some college, no degree  Occupational History   Not on file  Tobacco Use   Smoking status: Former    Types: Cigars   Smokeless tobacco: Never  Vaping Use   Vaping status: Former   Substances: CBD  Substance and Sexual Activity   Alcohol use: Yes    Comment: rare   Drug use: Never   Sexual activity: Not on file  Other Topics Concern   Not on file  Social History Narrative   Are you right handed or left handed? Right    Are you currently employed ? No   What is your current occupation?   Do you live at home alone?   Who lives with you? Husband, child and granddaughter   What type of home do you live in: 1 story or 2 story? 1       Social Drivers of Health   Tobacco Use: Medium Risk (07/28/2024)   Patient History    Smoking Tobacco Use:  Former    Smokeless Tobacco Use: Never    Passive Exposure: Not on file  Financial Resource Strain: Medium Risk (11/30/2022)   Overall Financial Resource Strain (CARDIA)    Difficulty of Paying Living Expenses: Somewhat hard  Food Insecurity: Food Insecurity Present (04/25/2023)   Hunger Vital Sign    Worried About Running Out of Food in the Last Year: Sometimes true    Ran Out of Food in the Last Year: Sometimes true  Transportation Needs: No Transportation Needs (04/25/2023)   PRAPARE - Administrator, Civil Service (Medical): No    Lack of Transportation (Non-Medical): No  Physical Activity: Insufficiently Active (11/30/2022)   Exercise Vital Sign    Days of Exercise per Week: 2 days    Minutes of Exercise per Session: 30 min  Stress: Stress Concern Present (11/30/2022)   [employer] of Occupational [employer] - Occupational Stress Questionnaire    Feeling of Stress : To some extent  Social Connections: Not on File (04/06/2023)   Received from [facility]   Social Connections    Connectedness: 0  Depression (PHQ2-9): Low Risk (04/25/2023)   Depression (PHQ2-9)  PHQ-2 Score: 0  Alcohol Screen: Low Risk (11/30/2022)   Alcohol Screen    Last Alcohol Screening Score (AUDIT): 1  Housing: High Risk (03/21/2023)   Housing    Last Housing Risk Score: 2  Utilities: Not on file  Health Literacy: Not on file   Family History  Problem Relation Age of Onset   Deep vein thrombosis Father    Breast cancer Sister 73   Allergies[1] Current Outpatient Medications  Medication Sig Dispense Refill   meloxicam  (MOBIC ) 15 MG tablet Take 1 tablet (15 mg total) by mouth daily. 30 tablet 0   oxyCODONE  (ROXICODONE ) 5 MG immediate release tablet Take 1 tablet (5 mg total) by mouth every 4 (four) hours as needed for severe pain (pain score 7-10) or breakthrough pain. 15 tablet 0   Accu-Chek Softclix Lancets lancets Use as instructed 100 each 12   albuterol  (VENTOLIN  HFA) 108 (90 Base) MCG/ACT  inhaler Inhale 1-2 puffs into the lungs every 6 (six) hours as needed for wheezing or shortness of breath. 18 g 0   apixaban  (ELIQUIS ) 5 MG TABS tablet Take 1 tablet (5 mg total) by mouth 2 (two) times daily. 60 tablet 5   aspirin  EC 325 MG tablet Take 1 tablet (325 mg total) by mouth daily. 14 tablet 0   blood glucose meter kit and supplies KIT Dispense based on patient and insurance preference. Use up to four times daily as directed. 1 each 0   furosemide  (LASIX ) 20 MG tablet Take 1 tablet (20 mg total) by mouth daily. (Patient not taking: Reported on 07/18/2024) 6 tablet 0   glucose blood (ACCU-CHEK GUIDE) test strip Use as instructed 100 each 12   metFORMIN  (GLUCOPHAGE -XR) 500 MG 24 hr tablet Take 2 tablets (1,000 mg total) by mouth 2 (two) times daily. (Patient not taking: Reported on 07/18/2024) 360 tablet 3   methocarbamol  (ROBAXIN ) 500 MG tablet Take 1 tablet (500 mg total) by mouth 4 (four) times daily. (Patient not taking: Reported on 07/18/2024) 120 tablet 4   oxyCODONE  (ROXICODONE ) 5 MG immediate release tablet Take 1 tablet (5 mg total) by mouth every 4 (four) hours as needed for severe pain (pain score 7-10) or breakthrough pain. 10 tablet 0   OZEMPIC , 1 MG/DOSE, 4 MG/3ML SOPN INJECT 1 MG SUBCUTANEOUSLY ONCE A WEEK 3 mL 0   predniSONE  (DELTASONE ) 20 MG tablet Take 2 tablets daily with breakfast. (Patient not taking: Reported on 07/18/2024) 10 tablet 0   tiZANidine  (ZANAFLEX ) 4 MG tablet Take 1 tablet (4 mg total) by mouth every 6 (six) hours as needed for muscle spasms. (Patient not taking: Reported on 07/18/2024) 30 tablet 0   traMADol  (ULTRAM ) 50 MG tablet Take 1 tablet (50 mg total) by mouth every 6 (six) hours as needed. (Patient not taking: Reported on 07/18/2024) 20 tablet 0   No current facility-administered medications for this visit.   No results found.  Review of Systems:   A ROS was performed including pertinent positives and negatives as documented in the  HPI.   Musculoskeletal Exam:    There were no vitals taken for this visit.  Left shoulder incisions are well-appearing without erythema or drainage.  The spine position she can forward elevates approximately 60 degrees of external Tatian at side to 10 degrees.  Distal neurosensory exam intact  Imaging:      I personally reviewed and interpreted the radiographs.   Assessment:   2 weeks status post left shoulder biceps tenodesis and collagen patch augmentation.  This time she is experiencing some capsulitis and stiffness.  I would and have advised her on some fairly consistent passive range of motion exercises which she will pursue we will plan to see her back in 4 weeks for reassessment  Plan :    - Turn to clinic 4 weeks for reassessment      I personally saw and evaluated the patient, and participated in the management and treatment plan.  Elspeth Parker, MD Attending Physician, Orthopedic Surgery  This document was dictated using Dragon voice recognition software. A reasonable attempt at proof reading has been made to minimize errors.    [1]  Allergies Allergen Reactions   Amoxapine And Related Other (See Comments)    Burns skin, hair falls out   Amoxicillin Other (See Comments)    Burns skin and hair falls out   Oxycodone  Itching   Percocet [Oxycodone -Acetaminophen ] Itching   Morphine  Itching and Other (See Comments)    Must receive Benadryl  to tolerate   Rocephin [Ceftriaxone] Swelling, Rash and Other (See Comments)    Swelling at site where received   Tramadol  Itching and Other (See Comments)    Must receive Benadryl  to tolerate   "

## 2024-08-11 ENCOUNTER — Encounter (HOSPITAL_BASED_OUTPATIENT_CLINIC_OR_DEPARTMENT_OTHER): Payer: Self-pay | Admitting: Physical Therapy

## 2024-08-11 ENCOUNTER — Telehealth: Payer: Self-pay

## 2024-08-11 NOTE — Telephone Encounter (Signed)
 Angie with [facility] at [facility] called (triage vm) to let us  know they have tried contacting the patient about PT appts with them, and have been unsuccessful in reaching her. Do you have a different phone number for the patient, and can try calling her?  It looks like one of the therapists sent a MyChart message to the patient today regarding this, also. Angie's [phone], if you need to speak with her.

## 2024-08-13 ENCOUNTER — Ambulatory Visit: Admitting: Physical Therapy

## 2024-08-13 NOTE — Telephone Encounter (Signed)
 Spoke with Angie at [employer] informing her patient knows about appointment today

## 2024-08-13 NOTE — Therapy (Incomplete)
 " OUTPATIENT PHYSICAL THERAPY SHOULDER EVALUATION   Patient Name: Shannon Oneal MRN: [id number] DOB:15-Oct-1980, 44 y.o., female Today's Date: 08/13/2024  END OF SESSION:   Past Medical History:  Diagnosis Date   Acute deep vein thrombosis (DVT) of right lower extremity (HCC) 01/18/2021   Acute pulmonary embolism (HCC) 01/19/2021   Asthma    Cellulitis in diabetic foot (HCC)    Diabetes mellitus without complication (HCC)    DVT (deep venous thrombosis) (HCC) 02/05/2021   PE (pulmonary thromboembolism) (HCC) 2016   Past Surgical History:  Procedure Laterality Date   adenoids     BICEPT TENODESIS  07/28/2024   Procedure: TENODESIS, BICEPS;  Surgeon: Genelle Standing, MD;  Location: [facility];  Service: Orthopedics;;   CESAREAN SECTION     Patient Active Problem List   Diagnosis Date Noted   Biceps tendinitis of left upper extremity 07/28/2024   Rotator cuff tendonitis, left 07/28/2024   Abnormal uterine bleeding 03/02/2023   Right calf pain 01/17/2023   Chronic pain of lower extremity, bilateral 01/17/2023   Current smoker 01/17/2023   Acute pain of left shoulder 12/28/2022   Acute pain of right shoulder 12/28/2022   Routine adult health maintenance 03/01/2022   Drug-induced constipation 05/24/2021   Diabetic neuropathy (HCC) 05/24/2021   Acute DVT (deep venous thrombosis) (HCC) 05/10/2021   Bilateral pulmonary embolism (HCC) 02/04/2021   Deep vein thrombosis (DVT) of right lower extremity (HCC) 02/04/2021   Right leg pain 02/04/2021   Type 2 diabetes mellitus with hyperglycemia (HCC) 01/18/2021    PCP: Bascom Borer, NP  REFERRING PROVIDER: Genelle, MD  REFERRING DIAG: Left shoulder scope with debridement and biceps tenodesis  THERAPY DIAG:  No diagnosis found.  Rationale for Evaluation and Treatment: Rehabilitation  ONSET DATE: 07/28/24  SUBJECTIVE:                                                                                                                                                                                       SUBJECTIVE STATEMENT: *** Hand dominance: {MISC; OT HAND [phone]}  PERTINENT HISTORY: ***  PAIN:  Are you having pain? Yes: NPRS scale: *** Pain location: *** Pain description: *** Aggravating factors: *** Relieving factors: ***  PRECAUTIONS: {Therapy precautions:24002}  RED FLAGS: {PT Red Flags:29287}   WEIGHT BEARING RESTRICTIONS: No  FALLS:  Has patient fallen in last 6 months? {fallsyesno:27318}  LIVING ENVIRONMENT: Lives with: lives with their family Lives in: House/apartment Stairs: {opstairs:27293} Has following equipment at home: {Assistive devices:23999}  OCCUPATION: ***  PLOF: {PLOF:24004}  PATIENT GOALS:***  NEXT MD VISIT:   OBJECTIVE:  Note: Objective measures were completed at Evaluation unless otherwise noted.  DIAGNOSTIC FINDINGS:  ***  PATIENT SURVEYS:  {rehab surveys:24030:a}  COGNITION: Overall cognitive status: {cognition:24006}     SENSATION: {sensation:27233}  POSTURE: ***  UPPER EXTREMITY ROM:   Active ROM AROM Left eval PROM Left eval  Shoulder flexion    Shoulder extension    Shoulder abduction    Shoulder adduction    Shoulder internal rotation    Shoulder external rotation    Elbow flexion    Elbow extension    Wrist flexion    Wrist extension    Wrist ulnar deviation    Wrist radial deviation    Wrist pronation    Wrist supination    (Blank rows = not tested)  UPPER EXTREMITY MMT:  MMT Right eval Left eval  Shoulder flexion    Shoulder extension    Shoulder abduction    Shoulder adduction    Shoulder internal rotation    Shoulder external rotation    Middle trapezius    Lower trapezius    Elbow flexion    Elbow extension    Wrist flexion    Wrist extension    Wrist ulnar deviation    Wrist radial deviation    Wrist pronation    Wrist supination    Grip strength (lbs)    (Blank rows = not  tested)   JOINT MOBILITY TESTING:  ***  PALPATION:  ***                                                                                                                             TREATMENT DATE: ***   PATIENT EDUCATION: Education details: *** Person educated: {Person educated:25204} Education method: {Education Method:25205} Education comprehension: {Education Comprehension:25206}  HOME EXERCISE PROGRAM: ***  ASSESSMENT:  CLINICAL IMPRESSION: Patient is a 44 y.o. female who was seen today for physical therapy evaluation and treatment for ***.   OBJECTIVE IMPAIRMENTS: cardiopulmonary status limiting activity, decreased activity tolerance, decreased coordination, decreased endurance, decreased ROM, decreased strength, increased edema, increased fascial restrictions, increased muscle spasms, impaired flexibility, impaired UE functional use, improper body mechanics, postural dysfunction, and pain.   REHAB POTENTIAL: Good  CLINICAL DECISION MAKING: Stable/uncomplicated  EVALUATION COMPLEXITY: Low   GOALS: Goals reviewed with patient? Yes  SHORT TERM GOALS: Target date: 08/31/24  Independent with initial HEP Baseline: Goal status: INITIAL   LONG TERM GOALS: Target date: 11/11/24  Independent with advanced HEP Baseline:  Goal status: INITIAL  2.  Understand posture and body mechanics Baseline:  Goal status: INITIAL  3.  *** Baseline:  Goal status: INITIAL  4.  *** Baseline:  Goal status: INITIAL  5.  *** Baseline:  Goal status: INITIAL  6.  *** Baseline:  Goal status: INITIAL  PLAN:  PT FREQUENCY: {rehab frequency:25116}  PT DURATION: 12 weeks  PLANNED INTERVENTIONS: 97164- PT Re-evaluation, 97110-Therapeutic exercises, 97530- Therapeutic activity, [id number]- Neuromuscular re-education, 97535- Self Care, 02859- Manual therapy, G0283- Electrical stimulation (unattended), 97016- Vasopneumatic device, [id number]- Ultrasound, Patient/Family education, Taping,  Joint mobilization, Cryotherapy, and Moist heat  PLAN FOR NEXT SESSION: ***   OBADIAH OZELL ORN, PT 08/13/2024, 12:40 PM  "

## 2024-08-18 ENCOUNTER — Ambulatory Visit: Admitting: Physical Therapy

## 2024-08-20 ENCOUNTER — Ambulatory Visit: Admitting: Physical Therapy

## 2024-08-20 NOTE — Therapy (Incomplete)
 " OUTPATIENT PHYSICAL THERAPY SHOULDER EVALUATION   Patient Name: Shannon Oneal MRN: [id number] DOB:March 05, 1981, 44 y.o., female Today's Date: 08/20/2024  END OF SESSION:   Past Medical History:  Diagnosis Date   Acute deep vein thrombosis (DVT) of right lower extremity (HCC) 01/18/2021   Acute pulmonary embolism (HCC) 01/19/2021   Asthma    Cellulitis in diabetic foot (HCC)    Diabetes mellitus without complication (HCC)    DVT (deep venous thrombosis) (HCC) 02/05/2021   PE (pulmonary thromboembolism) (HCC) 2016   Past Surgical History:  Procedure Laterality Date   adenoids     BICEPT TENODESIS  07/28/2024   Procedure: TENODESIS, BICEPS;  Surgeon: Genelle Standing, MD;  Location: [facility];  Service: Orthopedics;;   CESAREAN SECTION     Patient Active Problem List   Diagnosis Date Noted   Biceps tendinitis of left upper extremity 07/28/2024   Rotator cuff tendonitis, left 07/28/2024   Abnormal uterine bleeding 03/02/2023   Right calf pain 01/17/2023   Chronic pain of lower extremity, bilateral 01/17/2023   Current smoker 01/17/2023   Acute pain of left shoulder 12/28/2022   Acute pain of right shoulder 12/28/2022   Routine adult health maintenance 03/01/2022   Drug-induced constipation 05/24/2021   Diabetic neuropathy (HCC) 05/24/2021   Acute DVT (deep venous thrombosis) (HCC) 05/10/2021   Bilateral pulmonary embolism (HCC) 02/04/2021   Deep vein thrombosis (DVT) of right lower extremity (HCC) 02/04/2021   Right leg pain 02/04/2021   Type 2 diabetes mellitus with hyperglycemia (HCC) 01/18/2021    PCP: T. Oley, NP  REFERRING PROVIDER: Genelle, MD  REFERRING DIAG: s/p left shoulder arthroscopy with SAD and biceps tenodesis  THERAPY DIAG:  No diagnosis found.  Rationale for Evaluation and Treatment: Rehabilitation  ONSET DATE: 07/28/24  SUBJECTIVE:                                                                                                                                                                                       SUBJECTIVE STATEMENT: *** Hand dominance: {MISC; OT HAND [phone]}  PERTINENT HISTORY: PE, DVT, DM, asthma, cellulitis  PAIN:  Are you having pain? Yes: NPRS scale: *** Pain location: *** Pain description: *** Aggravating factors: *** Relieving factors: ***  PRECAUTIONS: {Therapy precautions:24002}  RED FLAGS: {PT Red Flags:29287}   WEIGHT BEARING RESTRICTIONS: No  FALLS:  Has patient fallen in last 6 months? {fallsyesno:27318}  LIVING ENVIRONMENT: Lives with: lives with their family Lives in: House/apartment Stairs: {opstairs:27293} Has following equipment at home: {Assistive devices:23999}  OCCUPATION: ***  PLOF: {PLOF:24004}  PATIENT GOALS:***  NEXT MD VISIT:   OBJECTIVE:  Note: Objective measures were completed  at Evaluation unless otherwise noted.  DIAGNOSTIC FINDINGS:  ***  PATIENT SURVEYS:  {rehab surveys:24030:a}  COGNITION: Overall cognitive status: {cognition:24006}     SENSATION: {sensation:27233}  POSTURE: ***  UPPER EXTREMITY ROM:   Active ROM Right eval Left eval  Shoulder flexion    Shoulder extension    Shoulder abduction    Shoulder adduction    Shoulder internal rotation    Shoulder external rotation    Elbow flexion    Elbow extension    Wrist flexion    Wrist extension    Wrist ulnar deviation    Wrist radial deviation    Wrist pronation    Wrist supination    (Blank rows = not tested)  UPPER EXTREMITY MMT:  MMT Right eval Left eval  Shoulder flexion    Shoulder extension    Shoulder abduction    Shoulder adduction    Shoulder internal rotation    Shoulder external rotation    Middle trapezius    Lower trapezius    Elbow flexion    Elbow extension    Wrist flexion    Wrist extension    Wrist ulnar deviation    Wrist radial deviation    Wrist pronation    Wrist supination    Grip strength (lbs)    (Blank rows  = not tested)  SHOULDER SPECIAL TESTS: Impingement tests: {shoulder impingement test:25231:a} Rotator cuff assessment: {rotator cuff assessment:25234} Biceps assessment: {biceps assessment:25235}  JOINT MOBILITY TESTING:  ***  PALPATION:  ***                                                                                                                             TREATMENT DATE: ***   PATIENT EDUCATION: Education details: *** Person educated: {Person educated:25204} Education method: {Education Method:25205} Education comprehension: {Education Comprehension:25206}  HOME EXERCISE PROGRAM: ***  ASSESSMENT:  CLINICAL IMPRESSION: Patient is a 44 y.o. female who was seen today for physical therapy evaluation and treatment for ***.   OBJECTIVE IMPAIRMENTS: decreased activity tolerance, decreased endurance, decreased ROM, decreased strength, increased edema, increased fascial restrictions, increased muscle spasms, impaired flexibility, impaired UE functional use, improper body mechanics, postural dysfunction, and pain.   REHAB POTENTIAL: Good  CLINICAL DECISION MAKING: Evolving/moderate complexity  EVALUATION COMPLEXITY: {Evaluation complexity:25115}   GOALS: Goals reviewed with patient? Yes  SHORT TERM GOALS: Target date: 09/13/24  Independent with initial HEP Baseline: Goal status: INITIAL  LONG TERM GOALS: Target date: 11/18/24  Independent with advanced HEP Baseline:  Goal status: INITIAL  2.  Understand posture and body mechanics Baseline:  Goal status: INITIAL  3.  *** Baseline:  Goal status: INITIAL  4.  *** Baseline:  Goal status: INITIAL  5.  *** Baseline:  Goal status: INITIAL  6.  *** Baseline:  Goal status: INITIAL  PLAN:  PT FREQUENCY: {rehab frequency:25116}  PT DURATION: 12 weeks  PLANNED INTERVENTIONS: {rehab planned interventions:25118::97110-Therapeutic exercises,97530- Therapeutic [phone]- Neuromuscular  re-education,97535- Self Rjmz,02859- Manual  therapy,Patient/Family education}  PLAN FOR NEXT SESSION: PIERRETTE OBADIAH OZELL LELON, PT 08/20/2024, 9:15 AM  "

## 2024-08-22 ENCOUNTER — Encounter: Payer: Self-pay | Admitting: Nurse Practitioner

## 2024-08-22 ENCOUNTER — Ambulatory Visit (INDEPENDENT_AMBULATORY_CARE_PROVIDER_SITE_OTHER): Admitting: Nurse Practitioner

## 2024-08-22 ENCOUNTER — Other Ambulatory Visit (HOSPITAL_BASED_OUTPATIENT_CLINIC_OR_DEPARTMENT_OTHER): Payer: Self-pay | Admitting: Orthopaedic Surgery

## 2024-08-22 VITALS — BP 136/89 | HR 82 | Temp 97.4°F | Wt 227.0 lb

## 2024-08-22 DIAGNOSIS — F419 Anxiety disorder, unspecified: Secondary | ICD-10-CM | POA: Diagnosis not present

## 2024-08-22 DIAGNOSIS — Z13220 Encounter for screening for lipoid disorders: Secondary | ICD-10-CM | POA: Diagnosis not present

## 2024-08-22 DIAGNOSIS — E1165 Type 2 diabetes mellitus with hyperglycemia: Secondary | ICD-10-CM

## 2024-08-22 LAB — POCT GLYCOSYLATED HEMOGLOBIN (HGB A1C): Hemoglobin A1C: 6.4 % — AB (ref 4.0–5.6)

## 2024-08-22 MED ORDER — OZEMPIC (1 MG/DOSE) 4 MG/3ML ~~LOC~~ SOPN: 1.0000 mg | PEN_INJECTOR | SUBCUTANEOUS | 0 refills | Status: AC

## 2024-08-22 MED ORDER — HYDROXYZINE PAMOATE 25 MG PO CAPS: 25.0000 mg | ORAL_CAPSULE | Freq: Every evening | ORAL | 0 refills | Status: AC | PRN

## 2024-08-22 MED ORDER — TRAMADOL HCL 50 MG PO TABS: 50.0000 mg | ORAL_TABLET | Freq: Two times a day (BID) | ORAL | 0 refills | Status: AC | PRN

## 2024-08-22 MED ORDER — METHYLPREDNISOLONE 4 MG PO TBPK: ORAL_TABLET | ORAL | 0 refills | Status: AC

## 2024-08-22 NOTE — Progress Notes (Signed)
 "  Subjective   Patient ID: Shannon Oneal, female    DOB: 1981-06-17, 44 y.o.   MRN: [id number]  Chief Complaint  Patient presents with   Diabetes    Had surgery on arm Jan. 5th and still in a lot of pain.     Referring provider: Oley Bascom RAMAN, NP  Shannon Oneal is a 44 y.o. female with Past Medical History: 01/18/2021: Acute deep vein thrombosis (DVT) of right lower extremity  (HCC) 01/19/2021: Acute pulmonary embolism (HCC) No date: Asthma No date: Cellulitis in diabetic foot (HCC) No date: Diabetes mellitus without complication (HCC) 02/05/2021: DVT (deep venous thrombosis) (HCC) 2016: PE (pulmonary thromboembolism) (HCC)   HPI  44 year old female with history of DVT, PE, diabetes, asthma.     Patient presents today for diabetic follow-up.  Patient is compliant with meds. Her A1c is 6.4 today which is down from last visit.   Denies f/c/s, n/v/d, hemoptysis, PND, leg swelling. Denies chest pain or edema.  She she is currently following with neurology for migraines and polyneuropathy.  Patient did recently have left shoulder surgery and is awaiting physical therapy for her shoulder.  She does still have a lot of pain.  We will order short course of Ultram  for her today.  Patient complains of anxiety and trouble sleeping.  We will trial hydroxyzine  at bedtime.  We did give patient a bag of food from the food pantry today.  Denies f/c/s, n/v/d, hemoptysis, PND, leg swelling Denies chest pain or edema     Allergies[1]  Immunization History  Administered Date(s) Administered   Influenza,inj,Quad PF,6+ Mos 05/12/2021   PFIZER Comirnaty(Gray Top)Covid-19 Tri-Sucrose Vaccine 11/05/2019, 11/26/2019, 11/02/2020   PNEUMOCOCCAL CONJUGATE-20 05/24/2021   Tdap 11/18/2012    Tobacco History: Tobacco Use History[2] Counseling given: Not Answered   Outpatient Encounter Medications as of 08/22/2024  Medication Sig   Accu-Chek Softclix Lancets lancets Use as instructed   apixaban   (ELIQUIS ) 5 MG TABS tablet Take 1 tablet (5 mg total) by mouth 2 (two) times daily.   blood glucose meter kit and supplies KIT Dispense based on patient and insurance preference. Use up to four times daily as directed.   glucose blood (ACCU-CHEK GUIDE) test strip Use as instructed   hydrOXYzine  (VISTARIL ) 25 MG capsule Take 1 capsule (25 mg total) by mouth at bedtime as needed.   meloxicam  (MOBIC ) 15 MG tablet Take 1 tablet (15 mg total) by mouth daily.   metFORMIN  (GLUCOPHAGE -XR) 500 MG 24 hr tablet Take 2 tablets (1,000 mg total) by mouth 2 (two) times daily.   methylPREDNISolone  (MEDROL  DOSEPAK) 4 MG TBPK tablet Take per packet instructions   [DISCONTINUED] OZEMPIC , 1 MG/DOSE, 4 MG/3ML SOPN INJECT 1 MG SUBCUTANEOUSLY ONCE A WEEK   albuterol  (VENTOLIN  HFA) 108 (90 Base) MCG/ACT inhaler Inhale 1-2 puffs into the lungs every 6 (six) hours as needed for wheezing or shortness of breath. (Patient not taking: Reported on 08/22/2024)   aspirin  EC 325 MG tablet Take 1 tablet (325 mg total) by mouth daily. (Patient not taking: Reported on 08/22/2024)   furosemide  (LASIX ) 20 MG tablet Take 1 tablet (20 mg total) by mouth daily. (Patient not taking: Reported on 08/22/2024)   methocarbamol  (ROBAXIN ) 500 MG tablet Take 1 tablet (500 mg total) by mouth 4 (four) times daily. (Patient not taking: Reported on 08/22/2024)   oxyCODONE  (ROXICODONE ) 5 MG immediate release tablet Take 1 tablet (5 mg total) by mouth every 4 (four) hours as needed for severe pain (pain score 7-10)  or breakthrough pain. (Patient not taking: Reported on 08/22/2024)   oxyCODONE  (ROXICODONE ) 5 MG immediate release tablet Take 1 tablet (5 mg total) by mouth every 4 (four) hours as needed for severe pain (pain score 7-10) or breakthrough pain. (Patient not taking: Reported on 08/22/2024)   predniSONE  (DELTASONE ) 20 MG tablet Take 2 tablets daily with breakfast. (Patient not taking: Reported on 08/22/2024)   Semaglutide , 1 MG/DOSE, (OZEMPIC , 1 MG/DOSE,)  4 MG/3ML SOPN Inject 1 mg into the skin once a week.   tiZANidine  (ZANAFLEX ) 4 MG tablet Take 1 tablet (4 mg total) by mouth every 6 (six) hours as needed for muscle spasms. (Patient not taking: Reported on 08/22/2024)   traMADol  (ULTRAM ) 50 MG tablet Take 1 tablet (50 mg total) by mouth every 12 (twelve) hours as needed.   [DISCONTINUED] diphenhydrAMINE  (BENADRYL ) 25 mg capsule Take 1 capsule (25 mg total) by mouth every 6 (six) hours as needed for itching.   [DISCONTINUED] metoCLOPramide  (REGLAN ) 10 MG tablet Take 1 tablet (10 mg total) by mouth every 6 (six) hours as needed (Nausea or headache). (Patient not taking: Reported on 10/12/2017)   [DISCONTINUED] promethazine  (PHENERGAN ) 25 MG tablet Take 1 tablet (25 mg total) by mouth every 6 (six) hours as needed for nausea or vomiting (headache). (Patient not taking: Reported on 01/18/2021)   [DISCONTINUED] traMADol  (ULTRAM ) 50 MG tablet Take 1 tablet (50 mg total) by mouth every 6 (six) hours as needed. (Patient not taking: Reported on 08/22/2024)   No facility-administered encounter medications on file as of 08/22/2024.    Review of Systems  Review of Systems  Constitutional: Negative.   HENT: Negative.    Cardiovascular: Negative.   Gastrointestinal: Negative.   Allergic/Immunologic: Negative.   Neurological: Negative.   Psychiatric/Behavioral: Negative.       Objective:   BP 136/89   Pulse 82   Temp (!) 97.4 F (36.3 C) (Temporal)   Wt 227 lb (103 kg)   SpO2 99%   BMI 36.64 kg/m   Wt Readings from Last 5 Encounters:  08/22/24 227 lb (103 kg)  07/28/24 226 lb 3.1 oz (102.6 kg)  06/28/23 211 lb 8 oz (95.9 kg)  04/25/23 200 lb (90.7 kg)  03/02/23 202 lb 9.6 oz (91.9 kg)     Physical Exam Vitals and nursing note reviewed.  Constitutional:      General: She is not in acute distress.    Appearance: She is well-developed.  Cardiovascular:     Rate and Rhythm: Normal rate and regular rhythm.  Pulmonary:     Effort: Pulmonary  effort is normal.     Breath sounds: Normal breath sounds.  Neurological:     Mental Status: She is alert and oriented to person, place, and time.       Assessment & Plan:   Type 2 diabetes mellitus with hyperglycemia, without long-term current use of insulin  (HCC) -     POCT glycosylated hemoglobin (Hb A1C) -     Microalbumin / creatinine urine ratio -     Ozempic  (1 MG/DOSE); Inject 1 mg into the skin once a week.  Dispense: 3 mL; Refill: 0 -     CBC -     Comprehensive metabolic panel with GFR  Anxiety -     hydrOXYzine  Pamoate; Take 1 capsule (25 mg total) by mouth at bedtime as needed.  Dispense: 30 capsule; Refill: 0  Lipid screening -     Lipid panel  Other orders -  traMADol  HCl; Take 1 tablet (50 mg total) by mouth every 12 (twelve) hours as needed.  Dispense: 20 tablet; Refill: 0     Return in about 3 months (around 11/20/2024).     Bascom GORMAN Borer, NP 08/22/2024     [1]  Allergies Allergen Reactions   Amoxapine And Related Other (See Comments)    Burns skin, hair falls out   Amoxicillin Other (See Comments)    Burns skin and hair falls out   Oxycodone  Itching   Percocet [Oxycodone -Acetaminophen ] Itching   Morphine  Itching and Other (See Comments)    Must receive Benadryl  to tolerate   Rocephin [Ceftriaxone] Swelling, Rash and Other (See Comments)    Swelling at site where received   Tramadol  Itching and Other (See Comments)    Must receive Benadryl  to tolerate  [2]  Social History Tobacco Use  Smoking Status Former   Types: Cigars  Smokeless Tobacco Never   "

## 2024-08-23 LAB — LIPID PANEL
Chol/HDL Ratio: 3.8 ratio (ref 0.0–4.4)
Cholesterol, Total: 114 mg/dL (ref 100–199)
HDL: 30 mg/dL — ABNORMAL LOW
LDL Chol Calc (NIH): 74 mg/dL (ref 0–99)
Triglycerides: 37 mg/dL (ref 0–149)
VLDL Cholesterol Cal: 10 mg/dL (ref 5–40)

## 2024-08-23 LAB — COMPREHENSIVE METABOLIC PANEL WITH GFR
ALT: 16 [IU]/L (ref 0–32)
AST: 19 [IU]/L (ref 0–40)
Albumin: 3.5 g/dL — ABNORMAL LOW (ref 3.9–4.9)
Alkaline Phosphatase: 58 [IU]/L (ref 41–116)
BUN/Creatinine Ratio: 12 (ref 9–23)
BUN: 10 mg/dL (ref 6–24)
Bilirubin Total: 0.4 mg/dL (ref 0.0–1.2)
CO2: 22 mmol/L (ref 20–29)
Calcium: 8.7 mg/dL (ref 8.7–10.2)
Chloride: 108 mmol/L — ABNORMAL HIGH (ref 96–106)
Creatinine, Ser: 0.81 mg/dL (ref 0.57–1.00)
Globulin, Total: 3.2 g/dL (ref 1.5–4.5)
Glucose: 100 mg/dL — ABNORMAL HIGH (ref 70–99)
Potassium: 4.5 mmol/L (ref 3.5–5.2)
Sodium: 140 mmol/L (ref 134–144)
Total Protein: 6.7 g/dL (ref 6.0–8.5)
eGFR: 92 mL/min/{1.73_m2}

## 2024-08-23 LAB — CBC
Hematocrit: 37.9 % (ref 34.0–46.6)
Hemoglobin: 12.2 g/dL (ref 11.1–15.9)
MCH: 23.2 pg — ABNORMAL LOW (ref 26.6–33.0)
MCHC: 32.2 g/dL (ref 31.5–35.7)
MCV: 72 fL — ABNORMAL LOW (ref 79–97)
Platelets: 302 10*3/uL (ref 150–450)
RBC: 5.25 x10E6/uL (ref 3.77–5.28)
RDW: 13.7 % (ref 11.7–15.4)
WBC: 6.7 10*3/uL (ref 3.4–10.8)

## 2024-08-23 LAB — MICROALBUMIN / CREATININE URINE RATIO
Creatinine, Urine: 225.7 mg/dL
Microalb/Creat Ratio: 1 mg/g{creat} (ref 0–29)
Microalbumin, Urine: 3.1 ug/mL

## 2024-08-25 ENCOUNTER — Ambulatory Visit: Admitting: Physical Therapy

## 2024-08-27 ENCOUNTER — Ambulatory Visit: Payer: Self-pay | Admitting: Nurse Practitioner

## 2024-08-27 ENCOUNTER — Ambulatory Visit: Admitting: Physical Therapy

## 2024-08-27 ENCOUNTER — Encounter: Payer: Self-pay | Admitting: Physical Therapy

## 2024-08-27 DIAGNOSIS — M25612 Stiffness of left shoulder, not elsewhere classified: Secondary | ICD-10-CM

## 2024-08-27 DIAGNOSIS — M25512 Pain in left shoulder: Secondary | ICD-10-CM

## 2024-08-27 NOTE — Therapy (Signed)
 " OUTPATIENT PHYSICAL THERAPY SHOULDER EVALUATION   Patient Name: Shannon Oneal MRN: [id number] DOB:1981/04/30, 44 y.o., female Today's Date: 08/27/2024  END OF SESSION:  PT End of Session - 08/27/24 1022     Visit Number 1    Number of Visits 27    Date for Recertification  11/24/24    Authorization Type [employer]    Activity Tolerance Patient tolerated treatment well;Patient limited by pain    Behavior During Therapy [facility] for tasks assessed/performed          Past Medical History:  Diagnosis Date   Acute deep vein thrombosis (DVT) of right lower extremity (HCC) 01/18/2021   Acute pulmonary embolism (HCC) 01/19/2021   Asthma    Cellulitis in diabetic foot (HCC)    Diabetes mellitus without complication (HCC)    DVT (deep venous thrombosis) (HCC) 02/05/2021   PE (pulmonary thromboembolism) (HCC) 2016   Past Surgical History:  Procedure Laterality Date   adenoids     BICEPT TENODESIS  07/28/2024   Procedure: TENODESIS, BICEPS;  Surgeon: Genelle Standing, MD;  Location: [facility];  Service: Orthopedics;;   CESAREAN SECTION     Patient Active Problem List   Diagnosis Date Noted   Biceps tendinitis of left upper extremity 07/28/2024   Rotator cuff tendonitis, left 07/28/2024   Abnormal uterine bleeding 03/02/2023   Right calf pain 01/17/2023   Chronic pain of lower extremity, bilateral 01/17/2023   Current smoker 01/17/2023   Acute pain of left shoulder 12/28/2022   Acute pain of right shoulder 12/28/2022   Routine adult health maintenance 03/01/2022   Drug-induced constipation 05/24/2021   Diabetic neuropathy (HCC) 05/24/2021   Acute DVT (deep venous thrombosis) (HCC) 05/10/2021   Bilateral pulmonary embolism (HCC) 02/04/2021   Deep vein thrombosis (DVT) of right lower extremity (HCC) 02/04/2021   Right leg pain 02/04/2021   Type 2 diabetes mellitus with hyperglycemia (HCC) 01/18/2021    PCP: Oley, NP  REFERRING PROVIDER: Genelle, MD  REFERRING DIAG:  s/p left shoulder biceps tenodesis  THERAPY DIAG:  Acute pain of left shoulder  Stiffness of left shoulder, not elsewhere classified  Rationale for Evaluation and Treatment: Rehabilitation  ONSET DATE: 07/28/24  SUBJECTIVE:                                                                                                                                                                                      SUBJECTIVE STATEMENT: Patient reports that she had 2 years of shoulder issues, underwent a left biceps tenodesis 07/28/24.  Patient reports that she is frustrated with her motions, reports still very painful and limited motions, unable to do her hair  and has difficulty getting dressed.  Started prednisone  dose pak. Hand dominance: Right  PERTINENT HISTORY: DVT, pulmonary embolism, DM, PE  PAIN:  Are you having pain? Yes: NPRS scale: 7/10 at rest Pain location: left shoulder anterior Pain description: dull and sharp Aggravating factors: any motions away from the body, lifting 10/10 Relieving factors: ice, pain meds, rest at best a 4/10  PRECAUTIONS: None  RED FLAGS: None   WEIGHT BEARING RESTRICTIONS: No  FALLS:  Has patient fallen in last 6 months? 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  DIAGNOSTIC FINDINGS:  See chart  COGNITION: Overall cognitive status: Within functional limits for tasks assessed     SENSATION: WFL  POSTURE: Guarded, left arm next to body, shoulder elevated  UPPER EXTREMITY ROM:   All motions actively and passively caused pain an 8-9/10  Active ROM  AROM left Eval 08/27/24 PROM Left Eval 08/27/24  Shoulder flexion 90 100P!  Shoulder extension    Shoulder abduction 60 70  Shoulder adduction    Shoulder internal rotation 30 35  Shoulder external rotation 40 45  Elbow flexion    Elbow extension    Wrist flexion    Wrist extension    Wrist ulnar deviation    Wrist radial deviation    Wrist pronation    Wrist supination    (Blank rows = not tested)  UPPER EXTREMITY MMT: not tested due to pain  MMT Right eval Left eval  Shoulder flexion    Shoulder extension    Shoulder abduction    Shoulder adduction    Shoulder internal rotation    Shoulder external rotation    Middle trapezius    Lower trapezius    Elbow flexion    Elbow extension    Wrist flexion    Wrist extension    Wrist ulnar deviation    Wrist radial deviation    Wrist pronation    Wrist supination    Grip strength (lbs)    (Blank rows = not tested)   PALPATION:  Very resistant to  touch c/o pain and very tender, over uses the upper trap, it is tight                                                                                                                             TREATMENT DATE:  08/27/24 Evaluation, HEP   PATIENT EDUCATION: Education details: POC/HEP Person educated: Patient Education method: Programmer, Multimedia, Facilities Manager, Actor cues, Verbal cues, and Handouts Education comprehension: verbalized understanding, returned demonstration, verbal cues required, and tactile cues required  HOME EXERCISE PROGRAM: Access Code:  [email or web address] URL: [email or web address] Date: 08/27/2024 Prepared by: Ozell Mainland  Exercises - Seated Shoulder Flexion Towel Slide at Table Top  - 3 x daily - 7 x weekly - 1 sets - 10 reps - 10 hold - Seated Elbow Flexion Shoulder Internal Rotation AAROM at Table with Towel  - 3 x daily - 7 x weekly - 1 sets - 10 reps - 3 hold - Seated Elbow Extension and Shoulder External Rotation AAROM at Table with Towel   - 3 x daily - 7 x weekly - 1 sets - 10 reps - 3 hold - Seated Shoulder External Rotation PROM on Table  - 3 x daily - 7 x weekly - 1 sets - 10 reps - 10 hold - Supine Shoulder Flexion AAROM with Hands Clasped  - 3 x daily - 7 x weekly - 1 sets - 10 reps - 3 hold - Supine Active Assistive Single Arm Shoulder Protraction  - 3 x daily - 7 x weekly - 1 sets - 10 reps - 3 hold  ASSESSMENT:  CLINICAL IMPRESSION: Patient is a 44 y.o. female who was seen today for physical therapy evaluation and treatment for s/p left shoulder surgery with biceps tenodesis. She has had to miss a few appointments due to a scheduling mix up with her phone number and then the weather.  She reports that she has a lot of pain, very high rating of pain a 7/10 at rest and 10/10 with movements away from the body, she compensates greatly with the upper trap for motions.  She was very guarded and afraid of pain, we discussed that the pain is normal and she needs to get moving, I did some PROM and at the end she allowed much more ROM than measured above, again I feel that she is scared and stops with the pain.  OBJECTIVE IMPAIRMENTS: cardiopulmonary status limiting activity, decreased activity tolerance, decreased ROM, decreased strength, increased edema, increased fascial restrictions, increased muscle spasms, impaired flexibility, impaired UE functional use, improper body mechanics, postural dysfunction, and pain.    REHAB POTENTIAL: Good  CLINICAL DECISION MAKING: Evolving/moderate complexity  EVALUATION COMPLEXITY: Moderate   GOALS: Goals reviewed with patient? Yes  SHORT TERM GOALS: Target date: 09/20/24  Independent with initial HEP Baseline: Goal status: INITIAL  LONG TERM GOALS: Target date: 11/24/24  Independent with advanced HEP Baseline:  Goal status: INITIAL  2.  Decrease pain 50% Baseline: pain at rest a 7/10, with motions 10/10 Goal status: INITIAL  3.  Improve AROM of the left shoulder flexion to 140  degrees Baseline: 90 degrees Goal status: INITIAL  4.  Be able to do her hair and get dressed without help Baseline: reports husband helps with hair and dressing Goal status: INITIAL  5.  Be able to lift her grand kids Baseline: unable Goal status: INITIAL  PLAN:  PT FREQUENCY: 2x/week  PT DURATION: 12 weeks  PLANNED INTERVENTIONS: 97164- PT Re-evaluation, 97110-Therapeutic exercises, 97530- Therapeutic activity, 97112- Neuromuscular re-education, 97535- Self Care, [id number]- Manual therapy, [id number]- Electrical stimulation (unattended), 97016- Vasopneumatic device, [id number]- Ultrasound, [id number]- Ionotophoresis 4mg /ml Dexamethasone , Patient/Family education, Taping, Joint mobilization, and Cryotherapy  PLAN FOR NEXT SESSION: work on her ROM and natural motions   Josh Nicolosi W, PT 08/27/2024, 10:23 AM  "

## 2024-08-29 ENCOUNTER — Ambulatory Visit: Admitting: Physical Therapy

## 2024-08-29 DIAGNOSIS — M25512 Pain in left shoulder: Secondary | ICD-10-CM

## 2024-08-29 DIAGNOSIS — M25612 Stiffness of left shoulder, not elsewhere classified: Secondary | ICD-10-CM

## 2024-08-29 NOTE — Therapy (Signed)
 " OUTPATIENT PHYSICAL THERAPY SHOULDER    Patient Name: Shannon Oneal MRN: [id number] DOB:[birth year], 43 y.o., female Today's Date: 08/29/2024  END OF SESSION:  PT End of Session - 08/29/24 0843     Visit Number 2    Number of Visits 27    Date for Recertification  11/24/24    Authorization Type [employer]    PT Start Time 0845    PT Stop Time 0930    PT Time Calculation (min) 45 min          Past Medical History:  Diagnosis Date   Acute deep vein thrombosis (DVT) of right lower extremity (HCC) 01/18/2021   Acute pulmonary embolism (HCC) 01/19/2021   Asthma    Cellulitis in diabetic foot (HCC)    Diabetes mellitus without complication (HCC)    DVT (deep venous thrombosis) (HCC) 02/05/2021   PE (pulmonary thromboembolism) (HCC) 2016   Past Surgical History:  Procedure Laterality Date   adenoids     BICEPT TENODESIS  07/28/2024   Procedure: TENODESIS, BICEPS;  Surgeon: Genelle Standing, MD;  Location: [facility];  Service: Orthopedics;;   CESAREAN SECTION     Patient Active Problem List   Diagnosis Date Noted   Biceps tendinitis of left upper extremity 07/28/2024   Rotator cuff tendonitis, left 07/28/2024   Abnormal uterine bleeding 03/02/2023   Right calf pain 01/17/2023   Chronic pain of lower extremity, bilateral 01/17/2023   Current smoker 01/17/2023   Acute pain of left shoulder 12/28/2022   Acute pain of right shoulder 12/28/2022   Routine adult health maintenance 03/01/2022   Drug-induced constipation 05/24/2021   Diabetic neuropathy (HCC) 05/24/2021   Acute DVT (deep venous thrombosis) (HCC) 05/10/2021   Bilateral pulmonary embolism (HCC) 02/04/2021   Deep vein thrombosis (DVT) of right lower extremity (HCC) 02/04/2021   Right leg pain 02/04/2021   Type 2 diabetes mellitus with hyperglycemia (HCC) 01/18/2021    PCP: Oley, NP  REFERRING PROVIDER: Genelle, MD  REFERRING DIAG: s/p left shoulder biceps tenodesis  THERAPY DIAG:  Acute pain of  left shoulder  Stiffness of left shoulder, not elsewhere classified  Rationale for Evaluation and Treatment: Rehabilitation  ONSET DATE: 07/28/24  SUBJECTIVE:                                                                                                                                                                                      SUBJECTIVE STATEMENT: Doing okay, doing HEP -working on it    Patient reports that she had 2 years of shoulder issues, underwent a left biceps tenodesis 07/28/24.  Patient reports that she is frustrated with her motions, reports  still very painful and limited motions, unable to do her hair and has difficulty getting dressed.  Started prednisone  dose pak. Hand dominance: Right  PERTINENT HISTORY: DVT, pulmonary embolism, DM, PE  PAIN:  Are you having pain? Yes: NPRS scale: 6.5/10 at rest Pain location: left shoulder anterior Pain description: dull and sharp Aggravating factors: any motions away from the body, lifting 10/10 Relieving factors: ice, pain meds, rest at best a 4/10  PRECAUTIONS: None  RED FLAGS: None   WEIGHT BEARING RESTRICTIONS: No  FALLS:  Has patient fallen in last 6 months? 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  DIAGNOSTIC FINDINGS:  See chart  COGNITION: Overall cognitive status: Within functional limits for tasks assessed     SENSATION: WFL  POSTURE: Guarded, left arm next to body, shoulder elevated  UPPER EXTREMITY ROM:   All motions actively and passively caused pain an 8-9/10  Active ROM AROM left Eval 08/27/24  PROM Left Eval 08/27/24 Act/Pass 08/29/24 standing  Shoulder flexion 90 100P! 85/132  Shoulder extension     Shoulder abduction 60 70 80/ 75  Shoulder adduction     Shoulder internal rotation 30 35   Shoulder external rotation 40 45   Elbow flexion     Elbow extension     Wrist flexion     Wrist extension     Wrist ulnar deviation     Wrist radial deviation     Wrist pronation     Wrist supination     (Blank rows = not tested)  UPPER EXTREMITY MMT: not tested due to pain  MMT Right eval Left eval  Shoulder flexion    Shoulder extension    Shoulder abduction    Shoulder adduction    Shoulder internal rotation    Shoulder external rotation    Middle trapezius    Lower trapezius    Elbow flexion    Elbow extension    Wrist flexion    Wrist extension    Wrist ulnar deviation    Wrist radial deviation    Wrist pronation    Wrist supination    Grip strength (lbs)    (Blank rows = not tested)   PALPATION:  Very resistant to  touch c/o pain and very tender, over uses the upper trap, it is tight                                                                                                                             TREATMENT DATE:   08/29/24 UBE L 0 2 min fwd and  AROM and PROM  taken - see above. Pass measurements using finger ladder LUE finer ladder flex and abd 5 x each 2# shld shruggs and backward rolls 12 x 2 # wAtE bar shld ext and IR 12 x each 10 x 2# wAtE bar  Bicep curl, upright row, ER  10x 2# wAtE bar supine flex in available ROM and chest press 10 x each Left shld PROM supine    08/27/24 Evaluation, HEP   PATIENT EDUCATION: Education details: POC/HEP Person educated: Patient Education method: Programmer, Multimedia, Demonstration, Tactile cues, Verbal cues, and Handouts Education comprehension: verbalized understanding, returned demonstration, verbal cues required, and tactile cues required  HOME EXERCISE PROGRAM: Access Code: [email or web address] URL:  [email or web address] Date: 08/27/2024 Prepared by: Ozell Mainland  Exercises - Seated Shoulder Flexion Towel Slide at Table Top  - 3 x daily - 7 x weekly - 1 sets - 10 reps - 10 hold - Seated Elbow Flexion Shoulder Internal Rotation AAROM at Table with Towel  - 3 x daily - 7 x weekly - 1 sets - 10 reps - 3 hold - Seated Elbow Extension and Shoulder External Rotation AAROM at Table with Towel  - 3 x daily - 7 x weekly - 1 sets - 10 reps - 3 hold - Seated Shoulder External Rotation PROM on Table  - 3 x daily - 7 x weekly - 1 sets - 10 reps - 10 hold - Supine Shoulder Flexion AAROM with Hands Clasped  - 3 x daily - 7 x weekly - 1 sets - 10 reps - 3 hold - Supine Active Assistive Single Arm Shoulder Protraction  - 3 x daily - 7 x weekly - 1 sets - 10 reps - 3 hold  ASSESSMENT:  CLINICAL IMPRESSION: pnt returns for 1st visit after eval. Reports doing HEP and working arm. Pain maybe a bit better 6.5/10. STG met. Progressed ex for ROM with cuing to relax as she keeps left UT engaged and needs verb and tactile cuing to relax.  Cuing with PROM to relax    Patient is a 44 y.o. female who was seen today for physical therapy evaluation and treatment for s/p left shoulder surgery with biceps tenodesis. She has had to miss a few appointments due to a scheduling mix up with her phone number and then the weather.  She reports that she has a lot of pain, very high rating of pain a 7/10 at rest and 10/10 with movements away from the body, she compensates greatly with the upper trap for motions.  She was very guarded and afraid of pain, we discussed that the pain is normal and she needs to get moving, I did some PROM and at the end she allowed much more ROM than measured above, again I feel that she is scared and stops with the pain.  OBJECTIVE IMPAIRMENTS: cardiopulmonary status limiting activity, decreased activity tolerance, decreased ROM, decreased strength, increased edema, increased fascial  restrictions, increased muscle spasms, impaired flexibility, impaired UE functional use, improper body mechanics, postural dysfunction, and pain.    REHAB POTENTIAL: Good  CLINICAL DECISION MAKING: Evolving/moderate complexity  EVALUATION COMPLEXITY: Moderate   GOALS: Goals reviewed with patient? Yes  SHORT TERM GOALS: Target date: 09/20/24  Independent with initial HEP Baseline: Goal status: 08/29/24 MET  LONG TERM GOALS: Target date: 11/24/24  Independent with advanced HEP Baseline:  Goal status: INITIAL  2.  Decrease pain 50% Baseline: pain at rest a 7/10, with motions 10/10 Goal status: INITIAL  3.  Improve AROM of the left  shoulder flexion to 140 degrees Baseline: 90 degrees Goal status: INITIAL  4.  Be able to do her hair and get dressed without help Baseline: reports husband helps with hair and dressing Goal status: INITIAL  5.  Be able to lift her grand kids Baseline: unable Goal status: INITIAL  PLAN:  PT FREQUENCY: 2x/week  PT DURATION: 12 weeks  PLANNED INTERVENTIONS: 97164- PT Re-evaluation, 97110-Therapeutic exercises, 97530- Therapeutic activity, 97112- Neuromuscular re-education, 97535- Self Care, [id number]- Manual therapy, [id number]- Electrical stimulation (unattended), 97016- Vasopneumatic device, 97035- Ultrasound, 02966- Ionotophoresis 4mg /ml Dexamethasone , Patient/Family education, Taping, Joint mobilization, and Cryotherapy  PLAN FOR NEXT SESSION: work on her ROM and natural motions   Mozetta Murfin,ANGIE, PTA 08/29/2024, 8:44 AM  "

## 2024-09-01 ENCOUNTER — Ambulatory Visit: Admitting: Physical Therapy

## 2024-09-03 ENCOUNTER — Ambulatory Visit: Admitting: Physical Therapy

## 2024-09-09 ENCOUNTER — Ambulatory Visit: Admitting: Physical Therapy

## 2024-09-11 ENCOUNTER — Ambulatory Visit: Admitting: Physical Therapy

## 2024-09-12 ENCOUNTER — Encounter: Admitting: Nurse Practitioner

## 2024-09-16 ENCOUNTER — Ambulatory Visit: Admitting: Physical Therapy

## 2024-09-18 ENCOUNTER — Ambulatory Visit: Admitting: Physical Therapy

## 2024-09-23 ENCOUNTER — Ambulatory Visit: Admitting: Physical Therapy

## 2024-09-25 ENCOUNTER — Ambulatory Visit: Admitting: Physical Therapy

## 2024-11-20 ENCOUNTER — Ambulatory Visit: Payer: Self-pay | Admitting: Nurse Practitioner
